# Patient Record
Sex: Female | Born: 1940
Health system: Southern US, Community
[De-identification: ages and names within clinical notes are randomized; demographics above are authoritative.]

## PROBLEM LIST (undated history)

## (undated) DIAGNOSIS — M5136 Other intervertebral disc degeneration, lumbar region: Secondary | ICD-10-CM

## (undated) DIAGNOSIS — M1990 Unspecified osteoarthritis, unspecified site: Secondary | ICD-10-CM

## (undated) DIAGNOSIS — F32A Depression, unspecified: Secondary | ICD-10-CM

## (undated) DIAGNOSIS — I6523 Occlusion and stenosis of bilateral carotid arteries: Secondary | ICD-10-CM

## (undated) DIAGNOSIS — R55 Syncope and collapse: Secondary | ICD-10-CM

## (undated) DIAGNOSIS — G629 Polyneuropathy, unspecified: Secondary | ICD-10-CM

## (undated) DIAGNOSIS — C801 Malignant (primary) neoplasm, unspecified: Secondary | ICD-10-CM

## (undated) DIAGNOSIS — D649 Anemia, unspecified: Secondary | ICD-10-CM

## (undated) DIAGNOSIS — E785 Hyperlipidemia, unspecified: Secondary | ICD-10-CM

## (undated) DIAGNOSIS — Z9889 Other specified postprocedural states: Secondary | ICD-10-CM

## (undated) DIAGNOSIS — R112 Nausea with vomiting, unspecified: Secondary | ICD-10-CM

## (undated) DIAGNOSIS — D497 Neoplasm of unspecified behavior of endocrine glands and other parts of nervous system: Secondary | ICD-10-CM

## (undated) DIAGNOSIS — K449 Diaphragmatic hernia without obstruction or gangrene: Secondary | ICD-10-CM

## (undated) DIAGNOSIS — I4510 Unspecified right bundle-branch block: Secondary | ICD-10-CM

## (undated) DIAGNOSIS — G43909 Migraine, unspecified, not intractable, without status migrainosus: Secondary | ICD-10-CM

## (undated) DIAGNOSIS — M51369 Other intervertebral disc degeneration, lumbar region without mention of lumbar back pain or lower extremity pain: Secondary | ICD-10-CM

## (undated) DIAGNOSIS — F329 Major depressive disorder, single episode, unspecified: Secondary | ICD-10-CM

## (undated) DIAGNOSIS — G4700 Insomnia, unspecified: Secondary | ICD-10-CM

## (undated) DIAGNOSIS — R519 Headache, unspecified: Secondary | ICD-10-CM

## (undated) DIAGNOSIS — IMO0001 Reserved for inherently not codable concepts without codable children: Secondary | ICD-10-CM

## (undated) DIAGNOSIS — K219 Gastro-esophageal reflux disease without esophagitis: Secondary | ICD-10-CM

## (undated) DIAGNOSIS — Z973 Presence of spectacles and contact lenses: Secondary | ICD-10-CM

## (undated) DIAGNOSIS — F419 Anxiety disorder, unspecified: Secondary | ICD-10-CM

## (undated) DIAGNOSIS — N3941 Urge incontinence: Secondary | ICD-10-CM

## (undated) DIAGNOSIS — Z9109 Other allergy status, other than to drugs and biological substances: Secondary | ICD-10-CM

## (undated) HISTORY — PX: TONSILLECTOMY: SUR1361

## (undated) HISTORY — PX: ABDOMINAL HYSTERECTOMY: SHX81

## (undated) HISTORY — PX: DILATION AND CURETTAGE OF UTERUS: SHX78

## (undated) HISTORY — PX: CHOLECYSTECTOMY: SHX55

## (undated) HISTORY — PX: COLONOSCOPY W/ BIOPSIES AND POLYPECTOMY: SHX1376

---
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
## 2011-03-31 ENCOUNTER — Ambulatory Visit: Payer: Self-pay

## 2014-02-28 ENCOUNTER — Emergency Department: Payer: Self-pay | Admitting: Emergency Medicine

## 2014-02-28 LAB — BASIC METABOLIC PANEL
ANION GAP: 5 — AB (ref 7–16)
BUN: 13 mg/dL (ref 7–18)
CO2: 23 mmol/L (ref 21–32)
Calcium, Total: 8.3 mg/dL — ABNORMAL LOW (ref 8.5–10.1)
Chloride: 104 mmol/L (ref 98–107)
Creatinine: 1.01 mg/dL (ref 0.60–1.30)
GFR CALC NON AF AMER: 57 — AB
GLUCOSE: 90 mg/dL (ref 65–99)
Osmolality: 264 (ref 275–301)
Potassium: 4.2 mmol/L (ref 3.5–5.1)
Sodium: 132 mmol/L — ABNORMAL LOW (ref 136–145)

## 2014-02-28 LAB — CBC WITH DIFFERENTIAL/PLATELET
BASOS PCT: 0.6 %
Basophil #: 0 10*3/uL (ref 0.0–0.1)
EOS PCT: 5 %
Eosinophil #: 0.2 10*3/uL (ref 0.0–0.7)
HCT: 34.8 % — AB (ref 35.0–47.0)
HGB: 11.4 g/dL — AB (ref 12.0–16.0)
LYMPHS PCT: 32.5 %
Lymphocyte #: 1.5 10*3/uL (ref 1.0–3.6)
MCH: 31.2 pg (ref 26.0–34.0)
MCHC: 32.7 g/dL (ref 32.0–36.0)
MCV: 95 fL (ref 80–100)
Monocyte #: 0.4 x10 3/mm (ref 0.2–0.9)
Monocyte %: 9.7 %
Neutrophil #: 2.4 10*3/uL (ref 1.4–6.5)
Neutrophil %: 52.2 %
Platelet: 212 10*3/uL (ref 150–440)
RBC: 3.66 10*6/uL — ABNORMAL LOW (ref 3.80–5.20)
RDW: 12.9 % (ref 11.5–14.5)
WBC: 4.6 10*3/uL (ref 3.6–11.0)

## 2014-02-28 LAB — URINALYSIS, COMPLETE
BACTERIA: NONE SEEN
Bilirubin,UR: NEGATIVE
Blood: NEGATIVE
Glucose,UR: NEGATIVE mg/dL (ref 0–75)
Ketone: NEGATIVE
NITRITE: NEGATIVE
Ph: 5 (ref 4.5–8.0)
Protein: NEGATIVE
RBC,UR: 1 /HPF (ref 0–5)
SPECIFIC GRAVITY: 1.012 (ref 1.003–1.030)
Squamous Epithelial: <1
WBC UR: 6 /HPF (ref 0–5)

## 2014-02-28 LAB — TROPONIN I: Troponin-I: <0.02 ng/mL

## 2014-12-07 ENCOUNTER — Other Ambulatory Visit: Payer: Self-pay | Admitting: Neurology

## 2014-12-07 DIAGNOSIS — R404 Transient alteration of awareness: Secondary | ICD-10-CM

## 2014-12-14 ENCOUNTER — Ambulatory Visit
Admission: RE | Admit: 2014-12-14 | Discharge: 2014-12-14 | Disposition: A | Discharge status: Home / Self Care | Payer: Medicare Other | Source: Ambulatory Visit | Attending: Neurology | Admitting: Neurology

## 2014-12-14 DIAGNOSIS — R404 Transient alteration of awareness: Secondary | ICD-10-CM | POA: Insufficient documentation

## 2015-03-15 ENCOUNTER — Ambulatory Visit: Payer: Medicare Other | Attending: Neurology

## 2015-03-15 DIAGNOSIS — R531 Weakness: Secondary | ICD-10-CM | POA: Diagnosis present

## 2015-03-15 DIAGNOSIS — R2681 Unsteadiness on feet: Secondary | ICD-10-CM

## 2015-03-16 NOTE — Therapy (Addendum)
[place] MAIN [facility] SERVICES [address] [place], [place], [address] Phone: [phone]   Fax:  [phone]  Physical Therapy Evaluation  Patient Details  Name: Heidi Cross MRN: [id number] Date of Birth: [birth year] Referring Provider: Ozella Rocks  Encounter Date: 03/15/2015      PT End of Session - 03/16/15 1151    Visit Number 1   Number of Visits 9   Date for PT Re-Evaluation 04/12/15   Authorization Type 1/10 g codes   PT Start Time 1093   PT Stop Time 1445   PT Time Calculation (min) 60 min   Equipment Utilized During Treatment Gait belt   Activity Tolerance Patient tolerated treatment well   Behavior During Therapy [facility] for tasks assessed/performed      History reviewed. No pertinent past medical history.  History reviewed. No pertinent past surgical history.  There were no vitals filed for this visit.  Visit Diagnosis:  Unsteadiness on feet - Plan: PT plan of care cert/re-cert  Weakness - Plan: PT plan of care cert/re-cert      Subjective Assessment - 03/16/15 1113    Subjective Pt reports she is concerned about her balance and suffered a serious fall in the last six months (August), where she fell in the bathroom and broke her nose and lost consciousness.  Pt believes part of the problem arises from her lawnmower accident in 2002 were she sustained permanent never damage that has progressively worsen and now has constant R LE pain, developed neuropathy, weakness in her R LE.  Pt is constantly looking down, stumbles, has difficultly clearing her right foot, and has difficulty with stairs and ambulating over unleveled surfaces and on carpet.  Pt has to use a cane, cart or hold on to her husband for support when ambulating outside her home. Pt recently had a nerve conduction test which showed bilateral neuropathy and decreased conduction to R LE musculature.  Pt currently has 3-4/10 pain in the lateral aspect of her lower  right leg.     Patient Stated Goals Pt wants to regain strength in her R LE and improve ability to do yard work.     Currently in Pain? Yes   Pain Score 4    Pain Orientation Right;Lower;Lateral   Pain Descriptors / Indicators Aching            [facility] PT Assessment - 03/16/15 1146    Assessment   Medical Diagnosis Imbalance   Referring Provider Brigitte Pulse, [place]   Onset Date/Surgical Date 02/14/15   Hand Dominance Right   Next MD Visit 08/20/2015   Prior Therapy none   Precautions   Precautions Fall   Restrictions   Weight Bearing Restrictions No   Balance Screen   Has the patient fallen in the past 6 months Yes   How many times? 1   Has the patient had a decrease in activity level because of a fear of falling?  Yes   Is the patient reluctant to leave their home because of a fear of falling?  No   Home Social worker Private residence   Living Arrangements Spouse/significant other   Available Help at Discharge Family;Friend(s)   Type of [place] One level   [employer] - single point;Walker - 2 wheels;Toilet riser   Prior Function   Level of Independence Independent   Vocation Retired   Charity fundraiser Status Within Functional Limits for  tasks assessed   Sensation   Light Touch Appears Intact   Proprioception Appears Intact   Coordination   Gross Motor Movements are Fluid and Coordinated Yes       PAIN: 3-4/10 on the lateral aspect of her R LE  POSTURE: Slump sitting with rounded shoulders Pt stands with knees slightly flexed and with slight forward trunk lean   AROM: LE WFL, right knee lacking ~2-3 degrees of full knee extension   STRENGTH:  Graded on a 0-5 scale Muscle Group Left Right  Elbow    Wrist/hand    Hip Flex 4- 4-  Hip Abd 3+ 3  Hip Add    Hip Ext 3+ 3+  Hip IR/ER    Knee Flex 4+ 4+  Knee Ext 5 5  Ankle DF 4+ 4  Ankle PF 2 2   SENSATION: Appears intact for LEs  LE Myotomes: WNL  bilaterally LE Dermatomes: decreased from L4-S2 on R LE  Reflex: Knee jerk: +2 bilaterally  Ankle jerk: absent bilaterally    FUNCTIONAL MOBILITY: Pt is independent with bed mobility and transfers   BALANCE: Pt is unsteady with static and dynamic activities and requires close supervision-CGA throughout session   GAIT: Pt ambulates with wide base of support,flexed knees bilaterally, minimal hip extension and plantarflexion, uses cane on R UE OUTCOME MEASURES: TEST Outcome Interpretation  5 times sit<>stand 17.38sec >34 yo, >15 sec indicates increased risk for falls  10 meter walk test    0.88 m/s with cane 0.80 m/s without cane   <1.0 m/s indicates increased risk for falls; limited community ambulator  Timed up and Go         10 sec with cane 12 sec without cane <14 sec indicates increased risk for falls  6 minute walk test                Feet 1000 feet is community Water quality scientist 40/56 <36/56 (100% risk for falls), 37-45 (80% risk for falls); 46-51 (>50% risk for falls); 52-55 (lower risk <25% of falls)  ABC scale 36.25%/100 %      there ex; Sit to stand x10, pt required close supervision for safety Hip abduction 2x10 each LE, pt required verbal and tactile cues to maintain hips in neutral and toes pointing forward                     PT Education - 03/16/15 1150    Education provided Yes   Education Details plan of care, HEP, outcome measures    Person(s) Educated Patient   Methods Explanation   Comprehension Verbalized understanding             PT Long Term Goals - 03/16/15 1158    PT LONG TERM GOAL #1   Title pt's Berg balance score will improve by at least 8 points indicating decreased fall risk    Baseline on eval (10/25) 40/56   Time 4   Period Weeks   Status New   PT LONG TERM GOAL #2   Title pt's 5x sit to stand will be less than 11 sec for average time for her age indicating improved functional LE    Baseline on  eval (10/25) 17.38 sec   Time 4   Period Weeks   Status New   PT LONG TERM GOAL #3   Title pt's ABC scale will improve by 20 points indicating imroved confidence with functional activites such as performing yard work  Baseline on eval (10/25) 36.25%    Time 4   Period Weeks               Plan - 2015/04/12 1152    Clinical Impression Statement pt is a pleasant 74 year old female with chief complaint unsteadiness and weakness.  Pt sustained injury that caused nerve damage to her R LE and now has constant pain.  Based on her evaluation today, she is at risk for falls and has decreased LE functional strength.  Pt displays decreased balance, especially dynamic balance.  Pt would benefit from skilled PT services to improve her impairments, decrease her risk of falls and improve functional activity tolerance to perform functional activities.     Pt will benefit from skilled therapeutic intervention in order to improve on the following deficits Abnormal gait;Difficulty walking;Decreased strength;Decreased balance;Decreased activity tolerance;Decreased endurance;Pain   Rehab Potential Fair   Clinical Impairments Affecting Rehab Potential comorbidities    PT Frequency 2x / week   PT Duration 4 weeks   PT Treatment/Interventions Electrical Stimulation;Cryotherapy;Moist Heat;Therapeutic exercise;Therapeutic activities;Stair training;Functional mobility training;Gait training;Balance training;Neuromuscular re-education;Manual techniques;Vestibular   PT Next Visit Plan progress HEP          G-Codes - 04/12/2015 1204    Functional Assessment Tool Used history, clinical judgement, outcome measures    Functional Limitation Mobility: Walking and moving around   Mobility: Walking and Moving Around Current Status (R1540) At least 40 percent but less than 60 percent impaired, limited or restricted   Mobility: Walking and Moving Around Goal Status [phone]) At least 20 percent but less than 40 percent  impaired, limited or restricted       Problem List There are no active problems to display for this patient.  Renford Dills, SPT This entire session was performed under direct supervision and direction of a licensed Chiropractor . I have personally read, edited and approve of the note as written. Gorden Harms. Tortorici, PT, DPT [phone]  Tortorici,Ashley Apr 12, 2015, 1:23 PM  [place] MAIN [facility] SERVICES [address] [place], [place], [address] Phone: [phone]   Fax:  [phone]  Name: SHARYN BRILLIANT MRN: [id number] Date of Birth: [birth year]

## 2015-03-16 NOTE — Patient Instructions (Signed)
[email or web address] Sit to stand 2x10 Standing hip abduction 2x10 each LE

## 2015-03-21 ENCOUNTER — Ambulatory Visit: Payer: Medicare Other | Admitting: Physical Therapy

## 2015-03-22 ENCOUNTER — Ambulatory Visit: Payer: Medicare Other

## 2015-03-24 ENCOUNTER — Ambulatory Visit: Payer: Medicare Other

## 2015-03-29 ENCOUNTER — Ambulatory Visit: Payer: Medicare Other | Attending: Neurology

## 2015-03-29 DIAGNOSIS — R531 Weakness: Secondary | ICD-10-CM | POA: Diagnosis present

## 2015-03-29 DIAGNOSIS — R2681 Unsteadiness on feet: Secondary | ICD-10-CM | POA: Diagnosis present

## 2015-03-29 NOTE — Therapy (Signed)
[place] MAIN [facility] SERVICES [address] [place], [place], [address] Phone: [phone]   Fax:  [phone]  Physical Therapy Treatment  Patient Details  Name: Heidi Cross MRN: [id number] Date of Birth: Apr 17, 1941 Referring Provider: Ozella Rocks  Encounter Date: 03/29/2015      PT End of Session - 03/29/15 1257    Visit Number 2   Number of Visits 9   Date for PT Re-Evaluation 04/12/15   Authorization Type 2/10 g codes   PT Start Time 0930   PT Stop Time 1015   PT Time Calculation (min) 45 min   Equipment Utilized During Treatment Gait belt   Activity Tolerance Patient tolerated treatment well   Behavior During Therapy [facility] for tasks assessed/performed      History reviewed. No pertinent past medical history.  History reviewed. No pertinent past surgical history.  There were no vitals filed for this visit.  Visit Diagnosis:  Unsteadiness on feet  Weakness      Subjective Assessment - 03/29/15 0936    Subjective pt relates she had to cancel her last appointments due to having a UTI.  Pt relates she feels better today but still feels weak and her balance is off.  Pt currently has 4/10 in her low back.     Patient Stated Goals Pt wants to regain strength in her R LE and improve ability to do yard work.     Currently in Pain? Yes   Pain Score 4    Pain Location Back   Pain Orientation Lower      There ex: Nustep level 3 x 2 min: no charge Standing hip flexion/abduction/extension 2x10 with cues to maintain upright posture versus leaning Heel raises 2x10 in //bars Sit to stand x10, with cues to bring heels further back and bring "nose over "toes to improve performance and decrease UE assist.   Neuor re-ed Static balance with NBOS with eyes closed and vertical/horizontal head turns x2 min Semi tandem stance 2x30sec each side with cuing for positioning and decrease use of UE for assist Standing on AIREX with vertical and  horizontal head turns and UE movements, eyes closed 2x2 min Pt required close supervision for safety  Pt required sitting and standing breaks throughout session                            PT Education - 03/29/15 1256    Education provided Yes   Education Details educated patient plan  of care how strengtheing and balance exercises decreases fall risk   Person(s) Educated Patient   Methods Explanation   Comprehension Verbalized understanding             PT Long Term Goals - 03/16/15 1158    PT LONG TERM GOAL #1   Title pt's Berg balance score will improve by at least 8 points indicating decreased fall risk    Baseline on eval (10/25) 40/56   Time 4   Period Weeks   Status New   PT LONG TERM GOAL #2   Title pt's 5x sit to stand will be less than 11 sec for average time for her age indicating improved functional LE    Baseline on eval (10/25) 17.38 sec   Time 4   Period Weeks   Status New   PT LONG TERM GOAL #3   Title pt's ABC scale will improve by 20 points indicating imroved confidence with  functional activites such as performing yard work    Baseline on eval (10/25) 36.25%    Time 4   Period Weeks               Plan - 03/29/15 1302    Clinical Impression Statement Pt did well today and did not experience an increase in pain with exercises today.  pt required rest breaks throughout session due to feeling fatigue and weak from her illness.  Pt demonstrates decreased weak hip extensors and abductors by compensating with other muscles and requires cueing for correct technique and patient reports increased difficulty performing exercises correctly.     Pt will benefit from skilled therapeutic intervention in order to improve on the following deficits Abnormal gait;Difficulty walking;Decreased strength;Decreased balance;Decreased activity tolerance;Decreased endurance;Pain   Rehab Potential Fair   Clinical Impairments Affecting Rehab Potential  comorbidities    PT Frequency 2x / week   PT Duration 4 weeks   PT Treatment/Interventions Electrical Stimulation;Cryotherapy;Moist Heat;Therapeutic exercise;Therapeutic activities;Stair training;Functional mobility training;Gait training;Balance training;Neuromuscular re-education;Manual techniques;Vestibular   PT Next Visit Plan progress HEP        Problem List There are no active problems to display for this patient.  Renford Dills, SPT This entire session was performed under direct supervision and direction of a licensed Chiropractor . I have personally read, edited and approve of the note as written. Gorden Harms. Tortorici, PT, DPT [phone]  Tortorici,Ashley 03/29/2015, 6:27 PM  [place] MAIN [facility] SERVICES [address] [place], [place], [address] Phone: [phone]   Fax:  [phone]  Name: Heidi Cross MRN: [id number] Date of Birth: Oct 12, 1940

## 2015-03-31 ENCOUNTER — Ambulatory Visit: Payer: Medicare Other

## 2015-03-31 DIAGNOSIS — R2681 Unsteadiness on feet: Secondary | ICD-10-CM | POA: Diagnosis not present

## 2015-03-31 DIAGNOSIS — R531 Weakness: Secondary | ICD-10-CM

## 2015-03-31 NOTE — Therapy (Signed)
[place] MAIN [facility] SERVICES [address] [place], [place], [address] Phone: [phone]   Fax:  [phone]  Physical Therapy Treatment  Patient Details  Name: Heidi Cross MRN: [id number] Date of Birth: Dec 09, 1940 Referring Provider: Ozella Rocks  Encounter Date: 03/31/2015      PT End of Session - 03/31/15 1951    Visit Number 3   Number of Visits 9   Date for PT Re-Evaluation 04/12/15   Authorization Type 3/10 g codes   PT Start Time [id number]   PT Stop Time 1430   PT Time Calculation (min) 45 min   Equipment Utilized During Treatment Gait belt   Activity Tolerance Patient tolerated treatment well   Behavior During Therapy [facility] for tasks assessed/performed      No past medical history on file.  No past surgical history on file.  There were no vitals filed for this visit.  Visit Diagnosis:  Unsteadiness on feet  Weakness      Subjective Assessment - 03/31/15 1950    Subjective Pt feels like she is stronger and has been walking short distances without an AD.  Pt had increased "neuropathy" after her last session which resolved by the following day.    Patient Stated Goals Pt wants to regain strength in her R LE and improve ability to do yard work.     Currently in Pain? No/denies   Pain Score 0-No pain       There ex: Nustep level 3 x 2 min: no charge Standing hip flexion/abduction/extension 2x10 with cues to maintain upright posture versus leaning laterally  Heel raises 2x10 in //bars SLR 2x10, pt required cues to maintain quad set, especially on right Clamshells 2x10, pt required tactile cues to prevent hips from rotating  Sit to stand in 16.40 seconds  Lying prone/on elbows, pt experienced decreased radicular pain from 4-2/10                          PT Education - 03/31/15 1951    Education provided Yes   Education Details plan of care, HEP and lyiing prone on elbows   Person(s) Educated  Patient   Methods Explanation   Comprehension Verbalized understanding             PT Long Term Goals - 03/16/15 1158    PT LONG TERM GOAL #1   Title pt's Berg balance score will improve by at least 8 points indicating decreased fall risk    Baseline on eval (10/25) 40/56   Time 4   Period Weeks   Status New   PT LONG TERM GOAL #2   Title pt's 5x sit to stand will be less than 11 sec for average time for her age indicating improved functional LE    Baseline on eval (10/25) 17.38 sec   Time 4   Period Weeks   Status New   PT LONG TERM GOAL #3   Title pt's ABC scale will improve by 20 points indicating imroved confidence with functional activites such as performing yard work    Baseline on eval (10/25) 36.25%    Time 4   Period Weeks               Plan - 03/31/15 1951    Clinical Impression Statement Pt focused on LE strengthening standing and in supine/sidelying. Pt did really well today with her exercise and was able to ambulate around the gym  without her cane.  Pt experienced increased back pain with bridges which resolved with lying prone and lying prone on elbows, suggesting pt may have lumbar derangement with extension preference.     Pt will benefit from skilled therapeutic intervention in order to improve on the following deficits Abnormal gait;Difficulty walking;Decreased strength;Decreased balance;Decreased activity tolerance;Decreased endurance;Pain   Rehab Potential Fair   Clinical Impairments Affecting Rehab Potential comorbidities    PT Frequency 2x / week   PT Duration 4 weeks   PT Treatment/Interventions Electrical Stimulation;Cryotherapy;Moist Heat;Therapeutic exercise;Therapeutic activities;Stair training;Functional mobility training;Gait training;Balance training;Neuromuscular re-education;Manual techniques;Vestibular   PT Next Visit Plan progress HEP        Problem List There are no active problems to display for this patient.  Heidi Cross,  SPT This entire session was performed under direct supervision and direction of a licensed Chiropractor . I have personally read, edited and approve of the note as written. Gorden Harms. Tortorici, PT, DPT [phone]  Tortorici,Ashley 03/31/2015, 10:42 PM  [place] MAIN [facility] SERVICES [address] [place], [place], [address] Phone: [phone]   Fax:  [phone]  Name: Heidi Cross MRN: [id number] Date of Birth: 1940-11-21

## 2015-03-31 NOTE — Patient Instructions (Signed)
[email or web address]  SLR 2x10 Clamshells 2x10

## 2015-04-05 ENCOUNTER — Ambulatory Visit: Payer: Medicare Other

## 2015-04-05 DIAGNOSIS — R2681 Unsteadiness on feet: Secondary | ICD-10-CM

## 2015-04-05 DIAGNOSIS — R531 Weakness: Secondary | ICD-10-CM

## 2015-04-05 NOTE — Therapy (Signed)
[place] MAIN [facility] SERVICES [address] [place], [place], [address] Phone: [phone]   Fax:  [phone]  Physical Therapy Treatment  Patient Details  Name: Heidi Cross MRN: [id number] Date of Birth: 1941/03/24 Referring Provider: Ozella Rocks  Encounter Date: 04/05/2015      PT End of Session - 04/05/15 1656    Visit Number 4   Number of Visits 9   Date for PT Re-Evaluation 04/12/15   Authorization Type 4/10 g codes   PT Start Time 1400   PT Stop Time 1430   PT Time Calculation (min) 30 min   Equipment Utilized During Treatment Gait belt   Activity Tolerance Patient tolerated treatment well   Behavior During Therapy [facility] for tasks assessed/performed      History reviewed. No pertinent past medical history.  History reviewed. No pertinent past surgical history.  There were no vitals filed for this visit.  Visit Diagnosis:  Unsteadiness on feet  Weakness      Subjective Assessment - 04/05/15 1653    Subjective Pt relates she feels her like her legs are getting stronger and did not have to use her cane at church this weekend to ascend steps.  Pt has had increased back pain since Saturday and thinks it is a combination of SLS, clamshells and extended standing and walking.  pt currently has 4/10 pain in her mid-back. pt was late today due family emergency.     Patient Stated Goals Pt wants to regain strength in her R LE and improve ability to do yard work.     Currently in Pain? Yes   Pain Score 4    Pain Location Back      There ex: Nustep level 2 x2 min: no charge [employer] press with green band 2x10 each side with final set with perturbations Calf stretch 3x30 seconds each LE Sit to stand x2 Pt required cueing for correct positioning and pace    Neuro re-ed: Tandem stance in//bars x30 sec each LE, pt experienced tightness in bilateral calfs Toe taps on AIREX pad 2x10 each LE, pt required cueing for correct  technique  Pt required break after to taps Vertical and horizontal head turns on narrow base of support with eyes closed 2x30 sec Pt required supervision throughout session for safety                            PT Education - 04/05/15 1655    Education provided Yes   Education Details plan of care and new hip/core strength exercise and balance exercise    Person(s) Educated Patient   Methods Explanation   Comprehension Verbalized understanding             PT Long Term Goals - 03/16/15 1158    PT LONG TERM GOAL #1   Title pt's Berg balance score will improve by at least 8 points indicating decreased fall risk    Baseline on eval (10/25) 40/56   Time 4   Period Weeks   Status New   PT LONG TERM GOAL #2   Title pt's 5x sit to stand will be less than 11 sec for average time for her age indicating improved functional LE    Baseline on eval (10/25) 17.38 sec   Time 4   Period Weeks   Status New   PT LONG TERM GOAL #3   Title pt's ABC scale will improve by 20  points indicating imroved confidence with functional activites such as performing yard work    Baseline on eval (10/25) 36.25%    Time 4   Period Weeks               Plan - 04/05/15 1659    Clinical Impression Statement Session consisted of neuro re-ed and strength exercises that did not increase pt's low back pain.  pt did well today and was able to ambulate around the gym with improved balance and perform sit to stand without UE assist and with fluid motion.  pt notes she might not be able to attend next session due to family matters.     Pt will benefit from skilled therapeutic intervention in order to improve on the following deficits Abnormal gait;Difficulty walking;Decreased strength;Decreased balance;Decreased activity tolerance;Decreased endurance;Pain   Rehab Potential Fair   Clinical Impairments Affecting Rehab Potential comorbidities    PT Frequency 2x / week   PT Duration 4 weeks    PT Treatment/Interventions Electrical Stimulation;Cryotherapy;Moist Heat;Therapeutic exercise;Therapeutic activities;Stair training;Functional mobility training;Gait training;Balance training;Neuromuscular re-education;Manual techniques;Vestibular   PT Next Visit Plan progress HEP        Problem List There are no active problems to display for this patient.  Renford Dills, SPT This entire session was performed under direct supervision and direction of a licensed Chiropractor . I have personally read, edited and approve of the note as written. Gorden Harms. Tortorici, PT, DPT [phone]  Tortorici,Ashley 04/06/2015, 9:11 AM  [place] MAIN [facility] SERVICES [address] [place], [place], [address] Phone: [phone]   Fax:  [phone]  Name: Heidi Cross MRN: [id number] Date of Birth: 1941/04/12

## 2015-04-07 ENCOUNTER — Ambulatory Visit: Payer: Medicare Other

## 2015-04-12 ENCOUNTER — Ambulatory Visit: Payer: Medicare Other

## 2015-04-13 ENCOUNTER — Inpatient Hospital Stay
Admission: RE | Admit: 2015-04-13 | Discharge: 2015-04-13 | Disposition: A | Discharge status: Home / Self Care | Payer: Self-pay | Source: Ambulatory Visit | Attending: *Deleted | Admitting: *Deleted

## 2015-04-13 ENCOUNTER — Other Ambulatory Visit: Payer: Self-pay | Admitting: *Deleted

## 2015-04-13 DIAGNOSIS — Z9289 Personal history of other medical treatment: Secondary | ICD-10-CM

## 2015-04-19 ENCOUNTER — Ambulatory Visit: Payer: Medicare Other

## 2015-04-19 DIAGNOSIS — R2681 Unsteadiness on feet: Secondary | ICD-10-CM

## 2015-04-19 DIAGNOSIS — R531 Weakness: Secondary | ICD-10-CM

## 2015-04-19 NOTE — Therapy (Signed)
[place] MAIN [facility] SERVICES [address] [place], [place], [address] Phone: [phone]   Fax:  [phone]  Physical Therapy Treatment  Patient Details  Name: Heidi Cross MRN: [id number] Date of Birth: 25-Dec-1940 Referring Provider: Ozella Rocks  Encounter Date: 04/19/2015      PT End of Session - 04/19/15 1429    Visit Number 5   Number of Visits 9   Date for PT Re-Evaluation 04/12/15   Authorization Type 5/10   PT Start Time [id number]   PT Stop Time 1425   PT Time Calculation (min) 40 min   Equipment Utilized During Treatment Gait belt   Activity Tolerance Patient tolerated treatment well   Behavior During Therapy [facility] for tasks assessed/performed      History reviewed. No pertinent past medical history.  History reviewed. No pertinent past surgical history.  There were no vitals filed for this visit.  Visit Diagnosis:  Unsteadiness on feet  Weakness      Subjective Assessment - 04/19/15 1428    Subjective pt reports her back has just been over worked and flared up. she has been very busy out of town and with PT. pt does report she feels her balance and leg press has improved.    Patient Stated Goals Pt wants to regain strength in her R LE and improve ability to do yard work.     Currently in Pain? Yes   Pain Score 5    Pain Location Back    Treatment: therex: PT reassessed outcome measures and progress towards goals as follows:       [facility] PT Assessment - 04/19/15 0001    Standardized Balance Assessment   Standardized Balance Assessment Berg Balance Test;Dynamic Gait Index;Timed Up and Go Test;Five Times Sit to Stand;10 meter walk test   Five times sit to stand comments  16   10 Meter Walk 1.54m/s   Berg Balance Test   Sit to Stand Able to stand without using hands and stabilize independently   Standing Unsupported Able to stand safely 2 minutes   Sitting with Back Unsupported but Feet Supported on Floor or Stool  Able to sit safely and securely 2 minutes   Stand to Sit Sits safely with minimal use of hands   Transfers Able to transfer safely, minor use of hands   Standing Unsupported with Eyes Closed Able to stand 10 seconds safely   Standing Ubsupported with Feet Together Able to place feet together independently and stand 1 minute safely   From Standing, Reach Forward with Outstretched Arm Can reach confidently >25 cm (10")   From Standing Position, Pick up Object from Floor Able to pick up shoe safely and easily   From Standing Position, Turn to Look Behind Over each Shoulder Looks behind from both sides and weight shifts well   Turn 360 Degrees Able to turn 360 degrees safely in 4 seconds or less   Standing Unsupported, Alternately Place Feet on Step/Stool Able to stand independently and safely and complete 8 steps in 20 seconds   Standing Unsupported, One Foot in Front Able to plae foot ahead of the other independently and hold 30 seconds   Standing on One Leg Able to lift leg independently and hold equal to or more than 3 seconds   Total Score 53   Dynamic Gait Index   Level Surface Mild Impairment   Change in Gait Speed Mild Impairment   Gait with Horizontal Head Turns Mild Impairment  Gait with Vertical Head Turns Moderate Impairment   Gait and Pivot Turn Normal   Step Over Obstacle Moderate Impairment   Step Around Obstacles Normal   Steps Mild Impairment   Total Score 16   Timed Up and Go Test   Normal TUG (seconds) 10                             PT Education - May 08, 2015 1429    Education provided Yes   Education Details plan of care, continue HEP, return to PT when ready   Person(s) Educated Patient   Methods Explanation   Comprehension Verbalized understanding             PT Long Term Goals - 2015/05/08 1431    PT LONG TERM GOAL #1   Title pt's Berg balance score will improve by at least 8 points indicating decreased fall risk    Baseline on eval  (10/25) 40/56   Time 4   Period Weeks   Status Achieved   PT LONG TERM GOAL #2   Title pt's 5x sit to stand will be less than 11 sec for average time for her age indicating improved functional LE    Baseline on eval (10/25) 17.38 sec   Time 4   Period Weeks   Status Achieved   PT LONG TERM GOAL #3   Title pt's ABC scale will improve by 20 points indicating imroved confidence with functional activites such as performing yard work    Baseline on eval (10/25) 36.25%    Time 4   Period Weeks   Status Achieved               Plan - 05-08-2015 1430    Clinical Impression Statement pt has achieved initial PT goals at this time and has improved her gait speed, LE functional strength and static balance. she significantly improved Berg balance test score from 40 to 53/56. pt was tested with DGI today and scored 16/24 and would likely benefit from continued skilled PT services. pt is having incerased lower back pain and other extrenuating circumstances where she would like to hold off of PT for a few months. pt will be DC to HEP at this time and advised to return to PT when her schedule allows.    Pt will benefit from skilled therapeutic intervention in order to improve on the following deficits Abnormal gait;Difficulty walking;Decreased strength;Decreased balance;Decreased activity tolerance;Decreased endurance;Pain   Rehab Potential Fair   Clinical Impairments Affecting Rehab Potential comorbidities    PT Frequency 2x / week   PT Duration 4 weeks   PT Treatment/Interventions Electrical Stimulation;Cryotherapy;Moist Heat;Therapeutic exercise;Therapeutic activities;Stair training;Functional mobility training;Gait training;Balance training;Neuromuscular re-education;Manual techniques;Vestibular   PT Next Visit Plan progress HEP          G-Codes - 2015/05/08 1432    Functional Assessment Tool Used history, clinical judgement, outcome measures    Functional Limitation Mobility: Walking and  moving around   Mobility: Walking and Moving Around Current Status [id number]) At least 20 percent but less than 40 percent impaired, limited or restricted   Mobility: Walking and Moving Around Goal Status [id number]) At least 20 percent but less than 40 percent impaired, limited or restricted      Problem List There are no active problems to display for this patient.  Gorden Harms. Deklyn Gibbon, PT, DPT [phone]  Shalin Vonbargen 05-08-15, 2:32 PM  [place] MAIN [facility] SERVICES  [place], [place], [address] Phone: [phone]   Fax:  [phone]  Name: Heidi Cross MRN: [id number] Date of Birth: 09-08-1940

## 2015-04-21 ENCOUNTER — Ambulatory Visit: Payer: Medicare Other

## 2015-05-25 ENCOUNTER — Other Ambulatory Visit: Payer: Self-pay | Admitting: Family Medicine

## 2015-05-25 DIAGNOSIS — N63 Unspecified lump in unspecified breast: Secondary | ICD-10-CM

## 2015-05-25 DIAGNOSIS — Z1231 Encounter for screening mammogram for malignant neoplasm of breast: Secondary | ICD-10-CM

## 2015-06-10 ENCOUNTER — Ambulatory Visit
Admission: RE | Admit: 2015-06-10 | Discharge: 2015-06-10 | Disposition: A | Discharge status: Home / Self Care | Payer: Medicare Other | Source: Ambulatory Visit | Attending: Family Medicine | Admitting: Family Medicine

## 2015-06-10 ENCOUNTER — Other Ambulatory Visit: Payer: Self-pay | Admitting: Family Medicine

## 2015-06-10 DIAGNOSIS — Z1231 Encounter for screening mammogram for malignant neoplasm of breast: Secondary | ICD-10-CM

## 2015-06-10 DIAGNOSIS — N63 Unspecified lump in unspecified breast: Secondary | ICD-10-CM

## 2015-06-14 ENCOUNTER — Other Ambulatory Visit: Payer: Self-pay | Admitting: Internal Medicine

## 2015-06-14 DIAGNOSIS — G9001 Carotid sinus syncope: Secondary | ICD-10-CM

## 2015-06-21 ENCOUNTER — Ambulatory Visit
Admission: RE | Admit: 2015-06-21 | Discharge: 2015-06-21 | Disposition: A | Discharge status: Home / Self Care | Payer: Medicare Other | Source: Ambulatory Visit | Attending: Internal Medicine | Admitting: Internal Medicine

## 2015-06-21 DIAGNOSIS — G9001 Carotid sinus syncope: Secondary | ICD-10-CM

## 2015-06-21 DIAGNOSIS — I6523 Occlusion and stenosis of bilateral carotid arteries: Secondary | ICD-10-CM | POA: Insufficient documentation

## 2015-07-18 ENCOUNTER — Other Ambulatory Visit: Payer: Self-pay | Admitting: Physical Medicine and Rehabilitation

## 2015-07-18 DIAGNOSIS — M5416 Radiculopathy, lumbar region: Secondary | ICD-10-CM

## 2015-08-05 ENCOUNTER — Ambulatory Visit
Admission: RE | Admit: 2015-08-05 | Discharge: 2015-08-05 | Disposition: A | Discharge status: Home / Self Care | Payer: Medicare Other | Source: Ambulatory Visit | Attending: Physical Medicine and Rehabilitation | Admitting: Physical Medicine and Rehabilitation

## 2015-08-05 DIAGNOSIS — M4690 Unspecified inflammatory spondylopathy, site unspecified: Secondary | ICD-10-CM | POA: Insufficient documentation

## 2015-08-05 DIAGNOSIS — M5416 Radiculopathy, lumbar region: Secondary | ICD-10-CM | POA: Diagnosis present

## 2015-08-05 DIAGNOSIS — M2578 Osteophyte, vertebrae: Secondary | ICD-10-CM | POA: Insufficient documentation

## 2015-08-05 DIAGNOSIS — M488X6 Other specified spondylopathies, lumbar region: Secondary | ICD-10-CM | POA: Diagnosis not present

## 2015-08-05 DIAGNOSIS — M479 Spondylosis, unspecified: Secondary | ICD-10-CM | POA: Insufficient documentation

## 2015-08-12 ENCOUNTER — Other Ambulatory Visit: Payer: Self-pay | Admitting: Physical Medicine and Rehabilitation

## 2015-08-12 DIAGNOSIS — D329 Benign neoplasm of meninges, unspecified: Secondary | ICD-10-CM

## 2015-08-19 ENCOUNTER — Ambulatory Visit
Admission: RE | Admit: 2015-08-19 | Discharge: 2015-08-19 | Disposition: A | Discharge status: Home / Self Care | Payer: Medicare Other | Source: Ambulatory Visit | Attending: Physical Medicine and Rehabilitation | Admitting: Physical Medicine and Rehabilitation

## 2015-08-19 DIAGNOSIS — D329 Benign neoplasm of meninges, unspecified: Secondary | ICD-10-CM | POA: Diagnosis not present

## 2015-08-19 LAB — POCT I-STAT CREATININE: CREATININE: 0.9 mg/dL (ref 0.44–1.00)

## 2015-08-19 MED ORDER — GADOBENATE DIMEGLUMINE 529 MG/ML IV SOLN
20.0000 mL | Freq: Once | INTRAVENOUS | Status: AC | PRN
  Administered 2015-08-19: 17 mL via INTRAVENOUS

## 2015-09-22 ENCOUNTER — Other Ambulatory Visit: Payer: Self-pay | Admitting: Neurosurgery

## 2015-10-25 ENCOUNTER — Encounter (HOSPITAL_COMMUNITY): Payer: Self-pay

## 2015-10-25 ENCOUNTER — Encounter (HOSPITAL_COMMUNITY)
Admission: RE | Admit: 2015-10-25 | Discharge: 2015-10-25 | Disposition: A | Discharge status: Home / Self Care | Payer: Medicare Other | Source: Ambulatory Visit | Attending: Neurosurgery | Admitting: Neurosurgery

## 2015-10-25 DIAGNOSIS — Z01812 Encounter for preprocedural laboratory examination: Secondary | ICD-10-CM | POA: Insufficient documentation

## 2015-10-25 DIAGNOSIS — Z01818 Encounter for other preprocedural examination: Secondary | ICD-10-CM | POA: Insufficient documentation

## 2015-10-25 DIAGNOSIS — R55 Syncope and collapse: Secondary | ICD-10-CM | POA: Diagnosis not present

## 2015-10-25 DIAGNOSIS — Z79899 Other long term (current) drug therapy: Secondary | ICD-10-CM | POA: Insufficient documentation

## 2015-10-25 DIAGNOSIS — D497 Neoplasm of unspecified behavior of endocrine glands and other parts of nervous system: Secondary | ICD-10-CM | POA: Diagnosis not present

## 2015-10-25 DIAGNOSIS — K219 Gastro-esophageal reflux disease without esophagitis: Secondary | ICD-10-CM | POA: Insufficient documentation

## 2015-10-25 HISTORY — DX: Other intervertebral disc degeneration, lumbar region without mention of lumbar back pain or lower extremity pain: M51.369

## 2015-10-25 HISTORY — DX: Other intervertebral disc degeneration, lumbar region: M51.36

## 2015-10-25 HISTORY — DX: Presence of spectacles and contact lenses: Z97.3

## 2015-10-25 HISTORY — DX: Reserved for inherently not codable concepts without codable children: IMO0001

## 2015-10-25 HISTORY — DX: Insomnia, unspecified: G47.00

## 2015-10-25 HISTORY — DX: Syncope and collapse: R55

## 2015-10-25 HISTORY — DX: Other specified postprocedural states: R11.2

## 2015-10-25 HISTORY — DX: Malignant (primary) neoplasm, unspecified: C80.1

## 2015-10-25 HISTORY — DX: Anemia, unspecified: D64.9

## 2015-10-25 HISTORY — DX: Other allergy status, other than to drugs and biological substances: Z91.09

## 2015-10-25 HISTORY — DX: Other specified postprocedural states: Z98.890

## 2015-10-25 HISTORY — DX: Neoplasm of unspecified behavior of endocrine glands and other parts of nervous system: D49.7

## 2015-10-25 HISTORY — DX: Gastro-esophageal reflux disease without esophagitis: K21.9

## 2015-10-25 HISTORY — DX: Occlusion and stenosis of bilateral carotid arteries: I65.23

## 2015-10-25 HISTORY — DX: Polyneuropathy, unspecified: G62.9

## 2015-10-25 LAB — CBC
HCT: 35.7 % — ABNORMAL LOW (ref 36.0–46.0)
HEMOGLOBIN: 11.9 g/dL — AB (ref 12.0–15.0)
MCH: 30.4 pg (ref 26.0–34.0)
MCHC: 33.3 g/dL (ref 30.0–36.0)
MCV: 91.1 fL (ref 78.0–100.0)
Platelets: 230 10*3/uL (ref 150–400)
RBC: 3.92 MIL/uL (ref 3.87–5.11)
RDW: 12.7 % (ref 11.5–15.5)
WBC: 5 10*3/uL (ref 4.0–10.5)

## 2015-10-25 LAB — SURGICAL PCR SCREEN
MRSA, PCR: NEGATIVE
Staphylococcus aureus: NEGATIVE

## 2015-10-25 LAB — BASIC METABOLIC PANEL
ANION GAP: 7 (ref 5–15)
BUN: 10 mg/dL (ref 6–20)
CALCIUM: 9.7 mg/dL (ref 8.9–10.3)
CO2: 28 mmol/L (ref 22–32)
Chloride: 99 mmol/L — ABNORMAL LOW (ref 101–111)
Creatinine, Ser: 0.82 mg/dL (ref 0.44–1.00)
GFR calc non Af Amer: >60 mL/min (ref >60)
Glucose, Bld: 99 mg/dL (ref 65–99)
Potassium: 4.2 mmol/L (ref 3.5–5.1)
Sodium: 134 mmol/L — ABNORMAL LOW (ref 135–145)

## 2015-10-25 NOTE — Pre-Procedure Instructions (Signed)
TRENISHA RUNCK  10/25/2015      [id number] [place]ina Rabon, [place] - [address] [place] [address] Phone: [phone] Fax: [phone]    Your procedure is scheduled on Thursday, November 03, 2015  Report to [facility] Admitting at 7:30 A.M.( per MD)  Call this number if you have problems the morning of surgery:  [phone]   Remember:  Do not eat food or drink liquids after midnight Wednesday, November 02, 2015  Take these medicines the morning of surgery with A SIP OF WATER : DULoxetine (CYMBALTA), fexofenadine (ALLEGRA), if needed: HYDROcodone-acetaminophen (NORCO/VICODIN) for pain,  fluticasone (FLONASE) nasal spray for allergies Stop taking Aspirin, vitamins, fish oil and herbal medications such as Glucosamine-Chondroitin . Do not take any NSAIDs ie: Ibuprofen, Advil, Naproxen, BC and Goody Powder or any medication containing Aspirin; stop Thursday, October 27, 2015.  Do not wear jewelry, make-up or nail polish.  Do not wear lotions, powders, or perfumes.  You may not wear deodorant.  Do not shave 48 hours prior to surgery.   Do not bring valuables to the hospital.  [facility] is not responsible for any belongings or valuables.  Contacts, dentures or bridgework may not be worn into surgery.  Leave your suitcase in the car.  After surgery it may be brought to your room.  For patients admitted to the hospital, discharge time will be determined by your treatment team.  Patients discharged the day of surgery will not be allowed to drive home.   Name and phone number of your driver:   Special instructions:  [place] - Preparing for Surgery  Before surgery, you can play an important role.  Because skin is not sterile, your skin needs to be as free of germs as possible.  You can reduce the number of germs on you skin by washing with CHG (chlorahexidine gluconate) soap before surgery.  CHG is an antiseptic cleaner which kills germs and bonds with the  skin to continue killing germs even after washing.  Please DO NOT use if you have an allergy to CHG or antibacterial soaps.  If your skin becomes reddened/irritated stop using the CHG and inform your nurse when you arrive at Short Stay.  Do not shave (including legs and underarms) for at least 48 hours prior to the first CHG shower.  You may shave your face.  Please follow these instructions carefully:   1.  Shower with CHG Soap the night before surgery and the morning of Surgery.  2.  If you choose to wash your hair, wash your hair first as usual with your normal shampoo.  3.  After you shampoo, rinse your hair and body thoroughly to remove the Shampoo.  4.  Use CHG as you would any other liquid soap.  You can apply chg directly  to the skin and wash gently with scrungie or a clean washcloth.  5.  Apply the CHG Soap to your body ONLY FROM THE NECK DOWN.  Do not use on open wounds or open sores.  Avoid contact with your eyes, ears, mouth and genitals (private parts).  Wash genitals (private parts) with your normal soap.  6.  Wash thoroughly, paying special attention to the area where your surgery will be performed.  7.  Thoroughly rinse your body with warm water from the neck down.  8.  DO NOT shower/wash with your normal soap after using and rinsing off the CHG Soap.  9.  Pat yourself dry with a clean towel.            10.  Wear clean pajamas.            11.  Place clean sheets on your bed the night of your first shower and do not sleep with pets.  Day of Surgery  Do not apply any lotions/deodorants the morning of surgery.  Please wear clean clothes to the hospital/surgery center.  Please read over the following fact sheets that you were given. Pain Booklet, Coughing and Deep Breathing, MRSA Information and Surgical Site Infection Prevention

## 2015-10-25 NOTE — Progress Notes (Signed)
Pt denies any acute cardiopulmonary issues. Pt denies having a cardiac cath. Pt stated that she is not currently being treated by a cardiologist. Pt stated that she last saw a cardiologist in 2014 when she " fainted and had to wear a heart monitor ( see Care Everywhere)."  Pt stated that her last episode of syncope was July 2016. Pt  stated she is currently under the care of Dr. Manuella Ghazi, Neurology  and PCP is Dr. Emily Filbert, both of [facility], [place], [place]. Pt chart forwarded to anesthesia to review history.

## 2015-10-26 NOTE — Progress Notes (Addendum)
Anesthesia Chart Review:  Pt is a 75 year old female scheduled for L1-2 laminectomy for resection of intradural tumor on 11/03/2015 with Dr. Arnoldo Morale.   PCP is Dr. Emily Filbert, neurologist is Dr. Jennings Books (notes from both in care everywhere).   PMH includes:  Syncope, post-op N/V, GERD. Never smoker. BMI 28  Medications include: prevacid, potassium  Preoperative labs reviewed.    EKG 10/25/15: Sinus bradycardia (56 bpm). Left axis deviation. Incomplete RBBB. Septal infarct, age undetermined  Carotid duplex 06/21/15:  1. Minimal bilateral carotid atherosclerotic vascular disease, no flow limiting stenosis. Degree of stenosis less than 50% . 2. Vertebral arteries are patent antegrade flow.  Nuclear stress test 02/25/13 (care everywhere):  1. Attenuation artifacts anterior, inferior and inferolateral segments. 2. Possible mild reversible ischemia involving anterior lateral segment versus additional artifact. 3. Normal wall motion and normal ejection fraction 71% - notes in care everywhere from when this test was done indicate stress test reviewed by Dr. Ronnald Ramp with cardiology who felt test was normal.   Pt reports her last episode of syncope was 11/2014. Pt saw cardiology in 2015 (care everywhere) for syncope, an echo was recommended but didn't happen and pt never went back to cards. Notes from pt's previous PCP Dr. Campbell Stall (care everywhere) 04/22/15 indicate pt was referred to cardiology for syncope but this did not happen. Dr. Romeo Apple notes also indicate pt has had thorough neuro work up for syncope including head MRI, EEG and neuro conduction study.   Reviewed case with Dr. Deatra Canter. Pt will need cardiac eval for syncope prior to surgery. Left voicemail for Janett Billow in Dr. Arnoldo Morale' office.   Willeen Cass, FNP-BC [facility] Short Stay Surgical Center/Anesthesiology Phone: [phone] 10/27/2015 4:44 PM  Addendum:  Pt saw Dr. Nehemiah Massed with cardiology in [place] (care everywhere) and  ws cleared for surgery without further testing.   If no changes, I anticipate pt can proceed with surgery as scheduled.   Willeen Cass, FNP-BC [facility] Short Stay Surgical Center/Anesthesiology Phone: [phone] 11/02/2015 12:52 PM

## 2015-10-31 NOTE — Progress Notes (Signed)
Follow up phone message left with Janett Billow at Dr Adline Mango office to inquire about requested cardiac evaluation date/time.

## 2015-11-02 MED ORDER — CEFAZOLIN SODIUM-DEXTROSE 2-4 GM/100ML-% IV SOLN
2.0000 g | INTRAVENOUS | Status: AC
  Administered 2015-11-03: 2 g via INTRAVENOUS
  Filled 2015-11-02: qty 100

## 2015-11-03 ENCOUNTER — Encounter (HOSPITAL_COMMUNITY)
Admission: RE | Disposition: A | Discharge status: Home / Self Care | Payer: Self-pay | Source: Ambulatory Visit | Attending: Neurosurgery

## 2015-11-03 ENCOUNTER — Inpatient Hospital Stay (HOSPITAL_COMMUNITY): Payer: Medicare Other | Admitting: Anesthesiology

## 2015-11-03 ENCOUNTER — Inpatient Hospital Stay (HOSPITAL_COMMUNITY)
Admission: RE | Admit: 2015-11-03 | Discharge: 2015-11-04 | DRG: 520 | Disposition: A | Discharge status: Home / Self Care | Payer: Medicare Other | Source: Ambulatory Visit | Attending: Neurosurgery | Admitting: Neurosurgery

## 2015-11-03 ENCOUNTER — Encounter (HOSPITAL_COMMUNITY): Payer: Self-pay | Admitting: *Deleted

## 2015-11-03 ENCOUNTER — Inpatient Hospital Stay (HOSPITAL_COMMUNITY): Payer: Medicare Other

## 2015-11-03 ENCOUNTER — Inpatient Hospital Stay (HOSPITAL_COMMUNITY): Payer: Medicare Other | Admitting: Vascular Surgery

## 2015-11-03 DIAGNOSIS — Z419 Encounter for procedure for purposes other than remedying health state, unspecified: Secondary | ICD-10-CM

## 2015-11-03 DIAGNOSIS — M5416 Radiculopathy, lumbar region: Secondary | ICD-10-CM | POA: Diagnosis present

## 2015-11-03 DIAGNOSIS — M549 Dorsalgia, unspecified: Secondary | ICD-10-CM | POA: Diagnosis present

## 2015-11-03 DIAGNOSIS — Z90710 Acquired absence of both cervix and uterus: Secondary | ICD-10-CM

## 2015-11-03 DIAGNOSIS — D492 Neoplasm of unspecified behavior of bone, soft tissue, and skin: Secondary | ICD-10-CM | POA: Diagnosis present

## 2015-11-03 DIAGNOSIS — R32 Unspecified urinary incontinence: Secondary | ICD-10-CM | POA: Diagnosis present

## 2015-11-03 DIAGNOSIS — D3610 Benign neoplasm of peripheral nerves and autonomic nervous system, unspecified: Principal | ICD-10-CM | POA: Diagnosis present

## 2015-11-03 DIAGNOSIS — Z79899 Other long term (current) drug therapy: Secondary | ICD-10-CM | POA: Diagnosis not present

## 2015-11-03 DIAGNOSIS — K219 Gastro-esophageal reflux disease without esophagitis: Secondary | ICD-10-CM | POA: Diagnosis present

## 2015-11-03 HISTORY — PX: LAMINECTOMY: SHX219

## 2015-11-03 SURGERY — LUMBAR LAMINECTOMY FOR TUMOR
Anesthesia: General | Site: Spine Lumbar

## 2015-11-03 MED ORDER — OXYBUTYNIN CHLORIDE 5 MG PO TABS
5.0000 mg | ORAL_TABLET | Freq: Two times a day (BID) | ORAL | Status: DC
  Administered 2015-11-03 – 2015-11-04 (×2): 5 mg via ORAL
  Filled 2015-11-03 (×3): qty 1

## 2015-11-03 MED ORDER — CALCIUM CARBONATE-VITAMIN D 500-125 MG-UNIT PO TABS
1.0000 | ORAL_TABLET | ORAL | Status: DC
  Filled 2015-11-03: qty 1

## 2015-11-03 MED ORDER — ROCURONIUM BROMIDE 50 MG/5ML IV SOLN
INTRAVENOUS | Status: AC
  Filled 2015-11-03: qty 1

## 2015-11-03 MED ORDER — HEMOSTATIC AGENTS (NO CHARGE) OPTIME
TOPICAL | Status: DC | PRN
  Administered 2015-11-03: 1 via TOPICAL

## 2015-11-03 MED ORDER — MIDAZOLAM HCL 2 MG/2ML IJ SOLN
INTRAMUSCULAR | Status: AC
  Filled 2015-11-03: qty 2

## 2015-11-03 MED ORDER — MORPHINE SULFATE (PF) 2 MG/ML IV SOLN
INTRAVENOUS | Status: AC
  Filled 2015-11-03: qty 1

## 2015-11-03 MED ORDER — FENTANYL CITRATE (PF) 250 MCG/5ML IJ SOLN
INTRAMUSCULAR | Status: AC
  Filled 2015-11-03: qty 5

## 2015-11-03 MED ORDER — POTASSIUM 99 MG PO TABS: 1.0000 | ORAL_TABLET | Freq: Every day | ORAL | Status: DC

## 2015-11-03 MED ORDER — FENTANYL CITRATE (PF) 100 MCG/2ML IJ SOLN
25.0000 ug | INTRAMUSCULAR | Status: DC | PRN
  Administered 2015-11-03 (×3): 50 ug via INTRAVENOUS

## 2015-11-03 MED ORDER — HYDROCODONE-ACETAMINOPHEN 5-325 MG PO TABS
1.0000 | ORAL_TABLET | ORAL | Status: DC | PRN
  Administered 2015-11-03: 2 via ORAL

## 2015-11-03 MED ORDER — PANTOPRAZOLE SODIUM 40 MG PO TBEC
40.0000 mg | DELAYED_RELEASE_TABLET | Freq: Every day | ORAL | Status: DC
  Administered 2015-11-04: 40 mg via ORAL
  Filled 2015-11-03: qty 1

## 2015-11-03 MED ORDER — ONDANSETRON HCL 4 MG/2ML IJ SOLN: 4.0000 mg | INTRAMUSCULAR | Status: DC | PRN

## 2015-11-03 MED ORDER — LACTATED RINGERS IV SOLN
INTRAVENOUS | Status: DC
  Administered 2015-11-03: 08:00:00 via INTRAVENOUS

## 2015-11-03 MED ORDER — THROMBIN 5000 UNITS EX SOLR
OROMUCOSAL | Status: DC | PRN
  Administered 2015-11-03: 13:00:00 via TOPICAL

## 2015-11-03 MED ORDER — FLUTICASONE PROPIONATE 50 MCG/ACT NA SUSP
1.0000 | Freq: Every day | NASAL | Status: DC | PRN
  Filled 2015-11-03: qty 16

## 2015-11-03 MED ORDER — GLYCOPYRROLATE 0.2 MG/ML IJ SOLN
INTRAMUSCULAR | Status: DC | PRN
  Administered 2015-11-03: 0.4 mg via INTRAVENOUS

## 2015-11-03 MED ORDER — NEOSTIGMINE METHYLSULFATE 5 MG/5ML IV SOSY
PREFILLED_SYRINGE | INTRAVENOUS | Status: AC
  Filled 2015-11-03: qty 5

## 2015-11-03 MED ORDER — ALUM & MAG HYDROXIDE-SIMETH 200-200-20 MG/5ML PO SUSP: 30.0000 mL | Freq: Four times a day (QID) | ORAL | Status: DC | PRN

## 2015-11-03 MED ORDER — BUPIVACAINE-EPINEPHRINE 0.5% -1:200000 IJ SOLN
INTRAMUSCULAR | Status: DC | PRN
  Administered 2015-11-03: 5 mL

## 2015-11-03 MED ORDER — NEOSTIGMINE METHYLSULFATE 10 MG/10ML IV SOLN
INTRAVENOUS | Status: DC | PRN
  Administered 2015-11-03: 3 mg via INTRAVENOUS

## 2015-11-03 MED ORDER — BACITRACIN 50000 UNITS IM SOLR
INTRAMUSCULAR | Status: DC | PRN
  Administered 2015-11-03: 500 mL

## 2015-11-03 MED ORDER — LIDOCAINE-EPINEPHRINE 1 %-1:100000 IJ SOLN
INTRAMUSCULAR | Status: DC | PRN
Start: 2015-11-03 — End: 2015-11-03
  Administered 2015-11-03: 5 mL

## 2015-11-03 MED ORDER — THROMBIN 20000 UNITS EX SOLR
CUTANEOUS | Status: DC | PRN
  Administered 2015-11-03: 12:00:00 via TOPICAL

## 2015-11-03 MED ORDER — PHENYLEPHRINE 40 MCG/ML (10ML) SYRINGE FOR IV PUSH (FOR BLOOD PRESSURE SUPPORT)
PREFILLED_SYRINGE | INTRAVENOUS | Status: AC
  Filled 2015-11-03: qty 10

## 2015-11-03 MED ORDER — ONDANSETRON HCL 4 MG/2ML IJ SOLN
INTRAMUSCULAR | Status: AC
  Filled 2015-11-03: qty 2

## 2015-11-03 MED ORDER — DULOXETINE HCL 30 MG PO CPEP
30.0000 mg | ORAL_CAPSULE | Freq: Every day | ORAL | Status: DC
  Administered 2015-11-04: 30 mg via ORAL
  Filled 2015-11-03: qty 1

## 2015-11-03 MED ORDER — MENTHOL 3 MG MT LOZG: 1.0000 | LOZENGE | OROMUCOSAL | Status: DC | PRN

## 2015-11-03 MED ORDER — GLYCOPYRROLATE 0.2 MG/ML IV SOSY
PREFILLED_SYRINGE | INTRAVENOUS | Status: AC
  Filled 2015-11-03: qty 3

## 2015-11-03 MED ORDER — LACTATED RINGERS IV SOLN: INTRAVENOUS | Status: DC

## 2015-11-03 MED ORDER — FENTANYL CITRATE (PF) 100 MCG/2ML IJ SOLN
INTRAMUSCULAR | Status: AC
  Administered 2015-11-03: 50 ug via INTRAVENOUS
  Filled 2015-11-03: qty 2

## 2015-11-03 MED ORDER — 0.9 % SODIUM CHLORIDE (POUR BTL) OPTIME
TOPICAL | Status: DC | PRN
  Administered 2015-11-03: 1000 mL

## 2015-11-03 MED ORDER — EPHEDRINE SULFATE 50 MG/ML IJ SOLN
INTRAMUSCULAR | Status: DC | PRN
  Administered 2015-11-03 (×2): 10 mg via INTRAVENOUS

## 2015-11-03 MED ORDER — DIAZEPAM 5 MG PO TABS
5.0000 mg | ORAL_TABLET | Freq: Four times a day (QID) | ORAL | Status: DC | PRN
  Administered 2015-11-03 – 2015-11-04 (×3): 5 mg via ORAL
  Filled 2015-11-03 (×2): qty 1

## 2015-11-03 MED ORDER — BISACODYL 10 MG RE SUPP: 10.0000 mg | Freq: Every day | RECTAL | Status: DC | PRN

## 2015-11-03 MED ORDER — QC WOMENS DAILY MULTIVITAMIN PO TABS: 1.0000 | ORAL_TABLET | Freq: Every day | ORAL | Status: DC

## 2015-11-03 MED ORDER — LORATADINE 10 MG PO TABS
10.0000 mg | ORAL_TABLET | Freq: Every day | ORAL | Status: DC
  Administered 2015-11-04: 10 mg via ORAL
  Filled 2015-11-03: qty 1

## 2015-11-03 MED ORDER — FENTANYL CITRATE (PF) 100 MCG/2ML IJ SOLN
INTRAMUSCULAR | Status: AC
  Filled 2015-11-03: qty 2

## 2015-11-03 MED ORDER — ADULT MULTIVITAMIN W/MINERALS CH
1.0000 | ORAL_TABLET | Freq: Every day | ORAL | Status: DC
  Administered 2015-11-04: 1 via ORAL
  Filled 2015-11-03: qty 1

## 2015-11-03 MED ORDER — PHENOL 1.4 % MT LIQD: 1.0000 | OROMUCOSAL | Status: DC | PRN

## 2015-11-03 MED ORDER — MORPHINE SULFATE (PF) 2 MG/ML IV SOLN
1.0000 mg | INTRAVENOUS | Status: DC | PRN
  Administered 2015-11-03 (×2): 1 mg via INTRAVENOUS
  Administered 2015-11-03: 4 mg via INTRAVENOUS
  Filled 2015-11-03: qty 2

## 2015-11-03 MED ORDER — ROCURONIUM BROMIDE 100 MG/10ML IV SOLN
INTRAVENOUS | Status: DC | PRN
  Administered 2015-11-03: 40 mg via INTRAVENOUS
  Administered 2015-11-03: 10 mg via INTRAVENOUS
  Administered 2015-11-03: 20 mg via INTRAVENOUS

## 2015-11-03 MED ORDER — PROPOFOL 10 MG/ML IV BOLUS
INTRAVENOUS | Status: DC | PRN
  Administered 2015-11-03: 130 mg via INTRAVENOUS

## 2015-11-03 MED ORDER — DOCUSATE SODIUM 100 MG PO CAPS
100.0000 mg | ORAL_CAPSULE | Freq: Two times a day (BID) | ORAL | Status: DC
  Administered 2015-11-03 – 2015-11-04 (×2): 100 mg via ORAL
  Filled 2015-11-03 (×2): qty 1

## 2015-11-03 MED ORDER — BACITRACIN ZINC 500 UNIT/GM EX OINT
TOPICAL_OINTMENT | CUTANEOUS | Status: DC | PRN
Start: 2015-11-03 — End: 2015-11-03
  Administered 2015-11-03: 1 via TOPICAL

## 2015-11-03 MED ORDER — LIDOCAINE HCL (CARDIAC) 20 MG/ML IV SOLN
INTRAVENOUS | Status: DC | PRN
  Administered 2015-11-03: 100 mg via INTRAVENOUS

## 2015-11-03 MED ORDER — OXYCODONE-ACETAMINOPHEN 5-325 MG PO TABS
1.0000 | ORAL_TABLET | ORAL | Status: DC | PRN
  Administered 2015-11-03 – 2015-11-04 (×5): 2 via ORAL
  Filled 2015-11-03 (×4): qty 2

## 2015-11-03 MED ORDER — CALCIUM CARBONATE-VITAMIN D 500-200 MG-UNIT PO TABS
1.0000 | ORAL_TABLET | Freq: Every day | ORAL | Status: DC
  Administered 2015-11-04: 1 via ORAL
  Filled 2015-11-03: qty 1

## 2015-11-03 MED ORDER — PROPOFOL 10 MG/ML IV BOLUS
INTRAVENOUS | Status: AC
  Filled 2015-11-03: qty 20

## 2015-11-03 MED ORDER — FENTANYL CITRATE (PF) 100 MCG/2ML IJ SOLN
INTRAMUSCULAR | Status: DC | PRN
  Administered 2015-11-03: 100 ug via INTRAVENOUS
  Administered 2015-11-03: 150 ug via INTRAVENOUS

## 2015-11-03 MED ORDER — TRAZODONE HCL 50 MG PO TABS
50.0000 mg | ORAL_TABLET | Freq: Every day | ORAL | Status: DC
  Administered 2015-11-03: 50 mg via ORAL
  Filled 2015-11-03 (×2): qty 1

## 2015-11-03 MED ORDER — ACETAMINOPHEN 325 MG PO TABS: 650.0000 mg | ORAL_TABLET | ORAL | Status: DC | PRN

## 2015-11-03 MED ORDER — OXYCODONE-ACETAMINOPHEN 5-325 MG PO TABS
ORAL_TABLET | ORAL | Status: AC
  Filled 2015-11-03: qty 2

## 2015-11-03 MED ORDER — MIDAZOLAM HCL 5 MG/5ML IJ SOLN
INTRAMUSCULAR | Status: DC | PRN
  Administered 2015-11-03: 2 mg via INTRAVENOUS

## 2015-11-03 MED ORDER — CEFAZOLIN SODIUM-DEXTROSE 2-4 GM/100ML-% IV SOLN
2.0000 g | Freq: Three times a day (TID) | INTRAVENOUS | Status: AC
  Administered 2015-11-03 – 2015-11-04 (×2): 2 g via INTRAVENOUS
  Filled 2015-11-03 (×2): qty 100

## 2015-11-03 MED ORDER — ACETAMINOPHEN 650 MG RE SUPP: 650.0000 mg | RECTAL | Status: DC | PRN

## 2015-11-03 MED ORDER — ONDANSETRON HCL 4 MG/2ML IJ SOLN
INTRAMUSCULAR | Status: DC | PRN
  Administered 2015-11-03: 4 mg via INTRAVENOUS

## 2015-11-03 MED ORDER — DIAZEPAM 5 MG PO TABS
ORAL_TABLET | ORAL | Status: AC
  Filled 2015-11-03: qty 1

## 2015-11-03 MED ORDER — HYDROCODONE-ACETAMINOPHEN 5-325 MG PO TABS
ORAL_TABLET | ORAL | Status: AC
  Filled 2015-11-03: qty 2

## 2015-11-03 SURGICAL SUPPLY — 76 items
BAG DECANTER FOR FLEXI CONT (MISCELLANEOUS) ×2 IMPLANT
BENZOIN TINCTURE PRP APPL 2/3 (GAUZE/BANDAGES/DRESSINGS) ×2 IMPLANT
BLADE CLIPPER SURG (BLADE) IMPLANT
BLADE SURG 11 STRL SS (BLADE) ×2 IMPLANT
BLADE ULTRA TIP 2M (BLADE) IMPLANT
BRUSH SCRUB EZ 1% IODOPHOR (MISCELLANEOUS) IMPLANT
BUR MATCHSTICK NEURO 3.0 LAGG (BURR) ×2 IMPLANT
BUR PRECISION FLUTE 6.0 (BURR) IMPLANT
CANISTER SUCT 3000ML PPV (MISCELLANEOUS) ×2 IMPLANT
CLIP TI MEDIUM 6 (CLIP) ×2 IMPLANT
CONT SPEC 4OZ CLIKSEAL STRL BL (MISCELLANEOUS) ×2 IMPLANT
COTTONBALL LRG STERILE PKG (GAUZE/BANDAGES/DRESSINGS) IMPLANT
COVER MAYO STAND STRL (DRAPES) IMPLANT
DRAIN SNY 7 FPER (WOUND CARE) IMPLANT
DRAPE CAMERA VIDEO/LASER (DRAPES) IMPLANT
DRAPE LAPAROTOMY 100X72X124 (DRAPES) ×2 IMPLANT
DRAPE LONG LASER MIC (DRAPES) IMPLANT
DRAPE MICROSCOPE LEICA (MISCELLANEOUS) ×2 IMPLANT
DRAPE POUCH INSTRU U-SHP 10X18 (DRAPES) ×2 IMPLANT
DRAPE SHEET LG 3/4 BI-LAMINATE (DRAPES) ×2 IMPLANT
DRAPE SURG 17X23 STRL (DRAPES) ×8 IMPLANT
DURASEAL APPLICATOR TIP (TIP) ×2 IMPLANT
DURASEAL SPINE SEALANT 3ML (MISCELLANEOUS) ×2 IMPLANT
ELECT BLADE 4.0 EZ CLEAN MEGAD (MISCELLANEOUS) ×2
ELECT REM PT RETURN 9FT ADLT (ELECTROSURGICAL) ×2
ELECTRODE BLDE 4.0 EZ CLN MEGD (MISCELLANEOUS) ×1 IMPLANT
ELECTRODE REM PT RTRN 9FT ADLT (ELECTROSURGICAL) ×1 IMPLANT
EVACUATOR SILICONE 100CC (DRAIN) IMPLANT
GAUZE SPONGE 4X4 12PLY STRL (GAUZE/BANDAGES/DRESSINGS) ×2 IMPLANT
GAUZE SPONGE 4X4 16PLY XRAY LF (GAUZE/BANDAGES/DRESSINGS) IMPLANT
GLOVE BIO SURGEON STRL SZ8 (GLOVE) ×2 IMPLANT
GLOVE BIO SURGEON STRL SZ8.5 (GLOVE) ×2 IMPLANT
GLOVE BIOGEL PI IND STRL 7.5 (GLOVE) ×1 IMPLANT
GLOVE BIOGEL PI INDICATOR 7.5 (GLOVE) ×1
GLOVE EXAM NITRILE LRG STRL (GLOVE) IMPLANT
GLOVE EXAM NITRILE MD LF STRL (GLOVE) IMPLANT
GLOVE EXAM NITRILE XL STR (GLOVE) IMPLANT
GLOVE EXAM NITRILE XS STR PU (GLOVE) IMPLANT
GOWN STRL REUS W/ TWL LRG LVL3 (GOWN DISPOSABLE) ×4 IMPLANT
GOWN STRL REUS W/ TWL XL LVL3 (GOWN DISPOSABLE) ×1 IMPLANT
GOWN STRL REUS W/TWL LRG LVL3 (GOWN DISPOSABLE) ×4
GOWN STRL REUS W/TWL XL LVL3 (GOWN DISPOSABLE) ×1
HEMOSTAT POWDER KIT SURGIFOAM (HEMOSTASIS) ×2 IMPLANT
KIT BASIN OR (CUSTOM PROCEDURE TRAY) ×2 IMPLANT
KIT ROOM TURNOVER OR (KITS) ×2 IMPLANT
NEEDLE HYPO 21X1.5 SAFETY (NEEDLE) IMPLANT
NEEDLE HYPO 22GX1.5 SAFETY (NEEDLE) ×2 IMPLANT
NEEDLE SPNL 18GX3.5 QUINCKE PK (NEEDLE) ×2 IMPLANT
NS IRRIG 1000ML POUR BTL (IV SOLUTION) ×2 IMPLANT
PACK LAMINECTOMY NEURO (CUSTOM PROCEDURE TRAY) ×2 IMPLANT
PAD ARMBOARD 7.5X6 YLW CONV (MISCELLANEOUS) ×6 IMPLANT
PAD EYE OVAL STERILE LF (GAUZE/BANDAGES/DRESSINGS) IMPLANT
PATTIES SURGICAL .25X.25 (GAUZE/BANDAGES/DRESSINGS) ×2 IMPLANT
PATTIES SURGICAL .5 X.5 (GAUZE/BANDAGES/DRESSINGS) IMPLANT
PATTIES SURGICAL .5 X1 (DISPOSABLE) ×2 IMPLANT
PATTIES SURGICAL .5 X3 (DISPOSABLE) IMPLANT
PATTIES SURGICAL 1/4 X 3 (GAUZE/BANDAGES/DRESSINGS) IMPLANT
PATTIES SURGICAL 1X1 (DISPOSABLE) IMPLANT
RUBBERBAND STERILE (MISCELLANEOUS) ×4 IMPLANT
SPONGE LAP 4X18 X RAY DECT (DISPOSABLE) IMPLANT
SPONGE NEURO XRAY DETECT 1X3 (DISPOSABLE) IMPLANT
STAPLER SKIN PROX WIDE 3.9 (STAPLE) IMPLANT
STRIP CLOSURE SKIN 1/2X4 (GAUZE/BANDAGES/DRESSINGS) ×2 IMPLANT
SUT ETHILON 3 0 FSL (SUTURE) IMPLANT
SUT NURALON 4 0 TR CR/8 (SUTURE) ×2 IMPLANT
SUT PROLENE 6 0 BV (SUTURE) ×2 IMPLANT
SUT SILK 3 0 TIES 17X18 (SUTURE)
SUT SILK 3-0 18XBRD TIE BLK (SUTURE) IMPLANT
SUT VIC AB 1 CT1 18XBRD ANBCTR (SUTURE) ×1 IMPLANT
SUT VIC AB 1 CT1 8-18 (SUTURE) ×1
SUT VIC AB 2-0 CP2 18 (SUTURE) ×2 IMPLANT
TAPE CLOTH SURG 4X10 WHT LF (GAUZE/BANDAGES/DRESSINGS) ×2 IMPLANT
TOWEL OR 17X24 6PK STRL BLUE (TOWEL DISPOSABLE) ×2 IMPLANT
TOWEL OR 17X26 10 PK STRL BLUE (TOWEL DISPOSABLE) ×2 IMPLANT
TRAY FOLEY W/METER SILVER 16FR (SET/KITS/TRAYS/PACK) IMPLANT
WATER STERILE IRR 1000ML POUR (IV SOLUTION) ×2 IMPLANT

## 2015-11-03 NOTE — Anesthesia Postprocedure Evaluation (Signed)
Anesthesia Post Note  Patient: Heidi Cross  Procedure(s) Performed: Procedure(s) (LRB): L1-2 Laminectomy for resection of intradural tumor (N/A)  Patient location during evaluation: PACU Anesthesia Type: General Level of consciousness: sedated Pain management: satisfactory to patient Vital Signs Assessment: post-procedure vital signs reviewed and stable Respiratory status: spontaneous breathing Cardiovascular status: stable Anesthetic complications: no    Last Vitals:  Filed Vitals:   11/03/15 1515 11/03/15 1530  BP: 128/63   Pulse: 73 73  Temp:    Resp: 24 16    Last Pain:  Filed Vitals:   11/03/15 1540  PainSc: 7                  Rubbie Goostree EDWARD

## 2015-11-03 NOTE — Op Note (Signed)
Brief history: The patient is a 75 year old white female who has complained of back and right greater than left leg pain. She has failed medical management and was worked up with a lumbar MRI. This demonstrated a moderate sized intradural tumor at L1-2. I discussed the situation with the patient. We discussed the various treatment options. The patient has weighed the risks, benefits, and alternative surgery and decided see with a lumbar laminectomy with resection of her intradural tumor.  Preoperative diagnosis: L1-2 intradural tumor, lumbago, lumbar radiculopathy  Postoperative diagnosis: The same  Procedure: L1 and L2 laminectomy for gross total resection of intradural tumor using microdissection  Surgeon: Dr. Earle Gell  Assistant: Dr. Suezanne Jacquet Ditty  Anesthesia: Gen. tracheal  Estimated blood loss: Minimal  Specimens: Tumor  Drains: None  Coronal occasions: None  Description of procedure: The patient was brought to the operating room by the anesthesia team. General endotracheal anesthesia was induced. The patient was turned to the prone position on the Wilson frame. The patient's lumbosacral region was then prepared with Betadine scrub and Betadine solution. Sterile drapes were applied. I then injected the area to be incised with Marcaine with epinephrine solution. A scalpel to make a linear midline incision over the L1-2 interspace. I used electrocautery to perform a bilateral subperiosteal dissection exposing the spinous process and lamina of L1 and L2. We obtained intraoperative radiograph to confirm our location. I inserted the McCullough retractor for exposure.  I then incised interspinous ligament at L1-2 and T12-L1 with a scalpel. I removed the L1 spinous process with the Leksell rongeurs. I then used a high-speed drill to perform bilateral L1 laminotomies. I completed the L1 laminectomy with the Kerrison punches. I removed the T12-L1 ligamentum flavum and the L1-2 ligamentum flavum.  I removed the cephalad aspect of the L2 lamina. We then brought the operative microscope into the field and under his medication and illumination completed the microdissection. I used a 15 blade scalpel to create a small midline durotomy. I extended the durotomy in both cephalad and caudal direction with the [facility]. The arachnoid remained intact. We could see the tumor clearly through the arachnoid. We tacked up the dural edges with Nurolon sutures. I then incised arachnoid with the arachnoid knife. I tacked up the arachnoid to the dura using hemoclips. 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 I used microdissection to free up to nerve roots which were not involved with tumor but somewhat adherent to the surface of the tumor. We identified the filum terminalis. I coagulated the filum proximal to the tumor and ligated with the microscissors. I then coagulated the filum distal to the tumor and divided with microscissors. We then removed the tumor. We did not encounter any significant bleeding. I then irrigated the subarachnoid space out with saline and bacitracin solution. I then reapproximated the patient's dura with a running 6-0 Prolene suture. I placed DuraSeal over the durotomy. We obtained strange and hemostasis with electrocautery and FloSeal. I then removed the McCullough retractor and reapproximated patient's thoracolumbar fascia with interrupted #1 Vicryl suture. I reapproximated the subcutaneous tissue with interrupted 2-0 Vicryl suture. I reapproximated the skin with Steri-Strips and benzoin. The wound was then coated with bacitracin ointment. A sterile dressing was applied. The drapes were removed. The patient was subsequently returned to the supine position. By report all sponge, instrument, and needle counts were correct at the end of this case.

## 2015-11-03 NOTE — Anesthesia Preprocedure Evaluation (Signed)
Anesthesia Evaluation  Patient identified by MRN, date of birth, ID band Patient awake    Reviewed: Allergy & Precautions, H&P , Patient's Chart, lab work & pertinent test results, reviewed documented beta blocker date and time   Airway Mallampati: II  TM Distance: >3 FB Neck ROM: full    Dental no notable dental hx.    Pulmonary    Pulmonary exam normal breath sounds clear to auscultation       Cardiovascular  Rhythm:regular Rate:Normal     Neuro/Psych    GI/Hepatic GERD  Medicated,  Endo/Other    Renal/GU      Musculoskeletal   Abdominal   Peds  Hematology   Anesthesia Other Findings Hb 11.9 GERD    DDD (degenerative disc disease), lumbar   Peripheral neuropathy  Spinal cord tumor ........lumbar, 12mm    Syncope.... Seen by cardiology. Non cardiac in nature EF 70%     Shortness of breath          Reproductive/Obstetrics                             Anesthesia Physical Anesthesia Plan  ASA: II  Anesthesia Plan: General   Post-op Pain Management:    Induction: Intravenous  Airway Management Planned: Oral ETT  Additional Equipment:   Intra-op Plan:   Post-operative Plan: Extubation in OR  Informed Consent: I have reviewed the patients History and Physical, chart, labs and discussed the procedure including the risks, benefits and alternatives for the proposed anesthesia with the patient or authorized representative who has indicated his/her understanding and acceptance.   Dental Advisory Given and Dental advisory given  Plan Discussed with: CRNA and Surgeon  Anesthesia Plan Comments: (  Discussed general anesthesia, including possible nausea, instrumentation of airway, sore throat,pulmonary aspiration, etc. I asked if the were any outstanding questions, or  concerns before we proceeded. )        Anesthesia Quick Evaluation

## 2015-11-03 NOTE — Anesthesia Procedure Notes (Signed)
Procedure Name: Intubation Date/Time: 11/03/2015 10:53 AM Performed by: Mariea Clonts Pre-anesthesia Checklist: Emergency Drugs available, Patient identified, Timeout performed, Suction available and Patient being monitored Patient Re-evaluated:Patient Re-evaluated prior to inductionOxygen Delivery Method: Circle system utilized Preoxygenation: Pre-oxygenation with 100% oxygen Intubation Type: IV induction Ventilation: Mask ventilation without difficulty Laryngoscope Size: Miller and 2 Grade View: Grade III Tube type: Oral Number of attempts: 2 Airway Equipment and Method: Bougie stylet Placement Confirmation: ETT inserted through vocal cords under direct vision,  breath sounds checked- equal and bilateral and positive ETCO2 Tube secured with: Tape Dental Injury: Teeth and Oropharynx as per pre-operative assessment

## 2015-11-03 NOTE — Progress Notes (Signed)
Subjective:  The patient is somnolent but easily arousable. She looks well. She is in no apparent distress.  Objective: Vital signs in last 24 hours: Temp:  [97.9 F (36.6 C)] 97.9 F (36.6 C) (06/15 0808) Pulse Rate:  [61] 61 (06/15 0808) Resp:  [20] 20 (06/15 0808) BP: (112)/(48) 112/48 mmHg (06/15 0808) SpO2:  [98 %] 98 % (06/15 0808)  Intake/Output from previous day:   Intake/Output this shift: Total I/O In: 900 [I.V.:900] Out: 50 [Blood:50]  Physical exam the patient is somnolent but arousable. She is moving her lower extremities well.  Lab Results: No results for input(s): WBC, HGB, HCT, PLT in the last 72 hours. BMET No results for input(s): NA, K, CL, CO2, GLUCOSE, BUN, CREATININE, CALCIUM in the last 72 hours.  Studies/Results: Dg Lumbar Spine 2-3 Views  11/03/2015  CLINICAL DATA:  L1-L2 laminectomy for resection of intradural tumor. EXAM: LUMBAR SPINE - 2-3 VIEW COMPARISON:  MRI of the spine 08/19/2015 FINDINGS: Two intraoperative lateral radiographs of the lumbosacral spine demonstrate surgical instrument at the level of superior endplate of L2 vertebral body with tissue retractors posterior to it (film #2). The lumbosacral fracture is seen. IMPRESSION: Intraoperative radiographs from L1-L2 laminectomy. Electronically Signed   By: Fidela Salisbury M.D.   On: 11/03/2015 13:53    Assessment/Plan: The patient is doing well. I spoke with her family.  LOS: 0 days     Oluwaseyi Tull D 11/03/2015, 2:05 PM

## 2015-11-03 NOTE — Transfer of Care (Signed)
Immediate Anesthesia Transfer of Care Note  Patient: Heidi Cross  Procedure(s) Performed: Procedure(s) with comments: L1-2 Laminectomy for resection of intradural tumor (N/A) - L1-2 Laminectomy for resection of intradural tumor  Patient Location: PACU  Anesthesia Type:General  Level of Consciousness: awake, alert  and oriented  Airway & Oxygen Therapy: Patient Spontanous Breathing and Patient connected to nasal cannula oxygen  Post-op Assessment: Report given to RN, Post -op Vital signs reviewed and stable and Patient moving all extremities X 4  Post vital signs: Reviewed and stable  Last Vitals:  Filed Vitals:   11/03/15 0808  BP: 112/48  Pulse: 61  Temp: 36.6 C  Resp: 20    Last Pain:  Filed Vitals:   11/03/15 0812  PainSc: 5       Patients Stated Pain Goal: 4 ([id number])  Complications: No apparent anesthesia complications

## 2015-11-03 NOTE — H&P (Signed)
Subjective: The patient is a 75 year old white female who has complained of back and right greater than left leg pain. She has failed medical management and was worked up with a lumbar MRI. This demonstrated an intradural tumor at L1-2. We discussed the various treatment options including surgery. The patient has decided proceed with a lumbar laminectomy for resection of the tumor.   Past Medical History  Diagnosis Date  . PONV (postoperative nausea and vomiting)   . GERD (gastroesophageal reflux disease)   . DDD (degenerative disc disease), lumbar   . Peripheral neuropathy ([place])   . Cancer ([facility])     Basal Cell Carcinoma  . Environmental allergies   . Insomnia   . Spinal cord tumor ([place])     lumbar  . Wears contact lenses   . Syncope   . Shortness of breath dyspnea     with exertion  . Anemia     " when I was young and when I was pregnant "  . Mild atherosclerosis of both carotid arteries     Past Surgical History  Procedure Laterality Date  . Cholecystectomy    . Abdominal hysterectomy    . Tonsillectomy    . Colonoscopy w/ biopsies and polypectomy    . Dilation and curettage of uterus      No Known Allergies  Social History  Substance Use Topics  . Smoking status: Never Smoker   . Smokeless tobacco: Never Used  . Alcohol Use: Yes     Comment: occasional wine    Family History  Problem Relation Age of Onset  . Diabetes Mother   . Cancer Mother   . Hypertension Mother   . Cancer Father    Prior to Admission medications   Medication Sig Start Date End Date Taking? Authorizing Provider  Calcium Carbonate-Vitamin D (CALCIUM 500 + D) 500-125 MG-UNIT TABS Take 1 tablet by mouth every other day.   Yes Historical Provider, MD  DULoxetine (CYMBALTA) 30 MG capsule Take 30 mg by mouth daily.   Yes Historical Provider, MD  fexofenadine (ALLEGRA) 180 MG tablet Take 180 mg by mouth daily.   Yes Historical Provider, MD  fluticasone (FLONASE) 50 MCG/ACT nasal spray Place 1 spray  into both nostrils daily as needed for allergies or rhinitis.   Yes Historical Provider, MD  Glucosamine-Chondroitin 500-400 MG CAPS Take 1 tablet by mouth daily.  04/15/08  Yes Historical Provider, MD  HYDROcodone-acetaminophen (NORCO/VICODIN) 5-325 MG tablet Take 1 tablet by mouth every 6 (six) hours as needed for moderate pain.   Yes Historical Provider, MD  lansoprazole (PREVACID) 30 MG capsule Take 30 mg by mouth daily at 12 noon.   Yes Historical Provider, MD  Multiple Vitamins-Minerals (QC WOMENS DAILY MULTIVITAMIN) TABS Take 1 tablet by mouth daily.  04/15/08  Yes Historical Provider, MD  oxybutynin (DITROPAN) 5 MG tablet Take 5 mg by mouth 2 (two) times daily.  07/21/15 11/03/15 Yes Historical Provider, MD  Potassium 99 MG TABS Take 1 tablet by mouth daily.    Yes Historical Provider, MD  traZODone (DESYREL) 50 MG tablet Take 50 mg by mouth at bedtime.   Yes Historical Provider, MD     Review of Systems  Positive ROS: As above  All other systems have been reviewed and were otherwise negative with the exception of those mentioned in the HPI and as above.  Objective: Vital signs in last 24 hours: Temp:  [97.9 F (36.6 C)] 97.9 F (36.6 C) (06/15 0808) Pulse Rate:  [  61] 61 (06/15 0808) Resp:  [20] 20 (06/15 0808) BP: (112)/(48) 112/48 mmHg (06/15 0808) SpO2:  [98 %] 98 % (06/15 0808)  General Appearance: Alert, cooperative, no distress, Head: Normocephalic, without obvious abnormality, atraumatic Eyes: PERRL, conjunctiva/corneas clear, EOM's intact,    Ears: Normal  Throat: Normal  Neck: Supple, symmetrical, trachea midline, no adenopathy; thyroid: No enlargement/tenderness/nodules; no carotid bruit or JVD Back: Symmetric, no curvature, ROM normal, no CVA tenderness Lungs: Clear to auscultation bilaterally, respirations unlabored Heart: Regular rate and rhythm, no murmur, rub or gallop Abdomen: Soft, non-tender,, no masses, no organomegaly Extremities: Extremities normal,  atraumatic, no cyanosis or edema Pulses: 2+ and symmetric all extremities Skin: Skin color, texture, turgor normal, no rashes or lesions  NEUROLOGIC:   Mental status: alert and oriented, no aphasia, good attention span, Fund of knowledge/ memory ok Motor Exam - grossly normal Sensory Exam - grossly normal Reflexes:  Coordination - grossly normal Gait - grossly normal Balance - grossly normal Cranial Nerves: I: smell Not tested  II: visual acuity  OS: Normal  OD: Normal   II: visual fields Full to confrontation  II: pupils Equal, round, reactive to light  III,VII: ptosis None  III,IV,VI: extraocular muscles  Full ROM  V: mastication Normal  V: facial light touch sensation  Normal  V,VII: corneal reflex  Present  VII: facial muscle function - upper  Normal  VII: facial muscle function - lower Normal  VIII: hearing Not tested  IX: soft palate elevation  Normal  IX,X: gag reflex Present  XI: trapezius strength  5/5  XI: sternocleidomastoid strength 5/5  XI: neck flexion strength  5/5  XII: tongue strength  Normal    Data Review Lab Results  Component Value Date   WBC 5.0 10/25/2015   HGB 11.9* 10/25/2015   HCT 35.7* 10/25/2015   MCV 91.1 10/25/2015   PLT 230 10/25/2015   Lab Results  Component Value Date   NA 134* 10/25/2015   K 4.2 10/25/2015   CL 99* 10/25/2015   CO2 28 10/25/2015   BUN 10 10/25/2015   CREATININE 0.82 10/25/2015   GLUCOSE 99 10/25/2015   No results found for: INR, PROTIME  Assessment/Plan: L1 2 intradural tumor, lumbago, lumbar radiculopathy: I have discussed situation with the patient. I have reviewed her imaging studies with her and pointed out the abnormalities. We have discussed the various treatment options including surgery. I have described the surgical treatment option of a lumbar laminectomy with resection of the tumor. I have shown her surgical models. We have discussed the risks, benefits, alternatives, and likelihood of achieving her  goals for surgery. I have answered all the patient's questions. She has decided to proceed with surgery.   [name] 11/03/2015 10:40 AM

## 2015-11-04 ENCOUNTER — Encounter (HOSPITAL_COMMUNITY): Payer: Self-pay | Admitting: Neurosurgery

## 2015-11-04 MED ORDER — CYCLOBENZAPRINE HCL 5 MG PO TABS: 5.0000 mg | ORAL_TABLET | Freq: Three times a day (TID) | ORAL | Status: DC | PRN

## 2015-11-04 MED ORDER — HYDROCODONE-ACETAMINOPHEN 5-325 MG PO TABS: 1.0000 | ORAL_TABLET | ORAL | Status: DC | PRN

## 2015-11-04 MED ORDER — DOCUSATE SODIUM 100 MG PO CAPS: 100.0000 mg | ORAL_CAPSULE | Freq: Two times a day (BID) | ORAL | Status: DC

## 2015-11-04 NOTE — Discharge Summary (Signed)
Physician Discharge Summary  Patient ID: NARYAH PLATT MRN: [id number] DOB/AGE: 1940/06/20 75 y.o.  Admit date: 11/03/2015 Discharge date: 11/04/2015  Admission Diagnoses:Spinal cord tumor, lumbago, lumbar radiculopathy  Discharge Diagnoses: The same Active Problems:   Lumbar spine tumor   Discharged Condition: good  Hospital Course: I performed an L1 and L2 laminectomy for gross total resection of a spinal cord tumor. The surgery went well.  The patient's postoperative course was unremarkable. On postoperative day #1 the patient requested discharge home. She was given written and oral discharge instructions. All her questions were answered. She was pleased that her preoperative incontinence has resolved since surgery.  Consults: Physical therapy Significant Diagnostic Studies: None Treatments: L1-2 laminectomy for gross total resection of spinal cord tumor using microdissection. Discharge Exam: Blood pressure 111/37, pulse 74, temperature 99.4 F (37.4 C), temperature source Oral, resp. rate 18, SpO2 96 %. The patient is alert and pleasant. Her strength is grossly normal in her lower extremities. Her dressing is clean and dry. She is not having any headaches.  Disposition: Home  Discharge Instructions    Call MD for:  difficulty breathing, headache or visual disturbances    Complete by:  As directed      Call MD for:  extreme fatigue    Complete by:  As directed      Call MD for:  hives    Complete by:  As directed      Call MD for:  persistant dizziness or light-headedness    Complete by:  As directed      Call MD for:  persistant nausea and vomiting    Complete by:  As directed      Call MD for:  redness, tenderness, or signs of infection (pain, swelling, redness, odor or green/yellow discharge around incision site)    Complete by:  As directed      Call MD for:  severe uncontrolled pain    Complete by:  As directed      Call MD for:  temperature >100.4    Complete  by:  As directed      Diet - low sodium heart healthy    Complete by:  As directed      Discharge instructions    Complete by:  As directed   Call [phone] for a followup appointment. Take a stool softener while you are using pain medications.     Driving Restrictions    Complete by:  As directed   Do not drive for 2 weeks.     Increase activity slowly    Complete by:  As directed      Lifting restrictions    Complete by:  As directed   Do not lift more than 5 pounds. No excessive bending or twisting.     May shower / Bathe    Complete by:  As directed   He may shower after the pain she is removed 3 days after surgery. Leave the incision alone.     Remove dressing in 48 hours    Complete by:  As directed   Your stitches are under the scan and will dissolve by themselves. The Steri-Strips will fall off after you take a few showers. Do not rub back or pick at the wound, Leave the wound alone.            Medication List    STOP taking these medications        oxybutynin 5 MG tablet  Commonly known as:  DITROPAN      TAKE these medications        CALCIUM 500 + D 500-125 MG-UNIT Tabs  Generic drug:  Calcium Carbonate-Vitamin D  Take 1 tablet by mouth every other day.     cyclobenzaprine 5 MG tablet  Commonly known as:  FLEXERIL  Take 1 tablet (5 mg total) by mouth 3 (three) times daily as needed for muscle spasms.     docusate sodium 100 MG capsule  Commonly known as:  COLACE  Take 1 capsule (100 mg total) by mouth 2 (two) times daily.     DULoxetine 30 MG capsule  Commonly known as:  CYMBALTA  Take 30 mg by mouth daily.     fexofenadine 180 MG tablet  Commonly known as:  ALLEGRA  Take 180 mg by mouth daily.     fluticasone 50 MCG/ACT nasal spray  Commonly known as:  FLONASE  Place 1 spray into both nostrils daily as needed for allergies or rhinitis.     Glucosamine-Chondroitin 500-400 MG Caps  Take 1 tablet by mouth daily.     HYDROcodone-acetaminophen  5-325 MG tablet  Commonly known as:  NORCO/VICODIN  Take 1-2 tablets by mouth every 4 (four) hours as needed for moderate pain.     lansoprazole 30 MG capsule  Commonly known as:  PREVACID  Take 30 mg by mouth daily at 12 noon.     Potassium 99 MG Tabs  Take 1 tablet by mouth daily.     QC WOMENS DAILY MULTIVITAMIN Tabs  Take 1 tablet by mouth daily.     traZODone 50 MG tablet  Commonly known as:  DESYREL  Take 50 mg by mouth at bedtime.         SignedOphelia Charter 11/04/2015, 7:45 AM

## 2015-11-04 NOTE — Progress Notes (Signed)
Pt doing well. Pt and family given D/C instructions with Rx's, verbal understanding was provided. Pt's incision is clean and dry with no sign of infection. Pt's IV was removed prior to D/C. Pt D/C'd home via wheelchair @ 1125 per MD order. Pt is stable @ D/C and has no other needs at this time. Holli Humbles, RN

## 2015-11-04 NOTE — Discharge Instructions (Signed)

## 2015-11-04 NOTE — Evaluation (Signed)
Physical Therapy Evaluation Patient Details Name: Heidi Cross MRN: [id number] DOB: 01-21-1941 Today's Date: 11/04/2015   History of Present Illness  Pt is a 75 y/o female who presents s/p L1-L2 laminectomy for tumor removal on 11/03/15.  Clinical Impression  Pt admitted with above diagnosis. Pt currently with functional limitations due to the deficits listed below (see PT Problem List). At the time of PT eval pt was able to perform transfers and ambulation with min guard to close supervision for safety. Pt with lingering RLE weakness during standing activity consistent with pre-op diagnosis. Recommend follow-up at outpatient PT for continued RLE strengthening when cleared by MD. Pt will benefit from skilled PT to increase their independence and safety with mobility to allow discharge to the venue listed below.       Follow Up Recommendations Outpatient PT;Supervision for mobility/OOB    Equipment Recommendations  None recommended by PT    Recommendations for Other Services       Precautions / Restrictions Precautions Precautions: Fall;Back Precaution Booklet Issued: Yes (comment) Precaution Comments: Reviewed handout and pt was cued for precautions during functional mobility.  Restrictions Weight Bearing Restrictions: No      Mobility  Bed Mobility Overal bed mobility: Needs Assistance Bed Mobility: Rolling;Sidelying to Sit Rolling: Supervision Sidelying to sit: Supervision       General bed mobility comments: Close supervision for safety. Pt was able to transition to EOB with cues for sequencing and proper log roll technique.   Transfers Overall transfer level: Needs assistance Equipment used: Rolling walker (2 wheeled) Transfers: Sit to/from Stand Sit to Stand: Min assist;Min guard         General transfer comment: Assist required from bed in lowest position. No assist required from elevated surface. VC's for hand placement on seated surface for safety.    Ambulation/Gait Ambulation/Gait assistance: Min guard;Supervision Ambulation Distance (Feet): 300 Feet Assistive device: Rolling walker (2 wheeled) Gait Pattern/deviations: Step-through pattern;Decreased stride length;Trunk flexed Gait velocity: Decreased Gait velocity interpretation: Below normal speed for age/gender General Gait Details: Pt was able to ambulate fairly well in the hall. VC's for increased heel strike and improved posture. Pt was able to maintain for a short time however not able to maintain without additional cues.   Stairs Stairs: Yes Stairs assistance: Min guard Stair Management: One rail Right;Step to pattern;Forwards;Sideways Number of Stairs: 3 General stair comments: Pt was able to complete 3 stairs ascending with HHA on L and railing on R. When descending, pt held to the right railing and came down sideways.  Wheelchair Mobility    Modified Rankin (Stroke Patients Only)       Balance Overall balance assessment: Needs assistance Sitting-balance support: Feet supported;No upper extremity supported Sitting balance-Leahy Scale: Fair     Standing balance support: Bilateral upper extremity supported;During functional activity Standing balance-Leahy Scale: Poor Standing balance comment: Requires UE support to maintain standing balance.                              Pertinent Vitals/Pain Pain Assessment: Faces Faces Pain Scale: Hurts little more Pain Location: incision site Pain Descriptors / Indicators: Operative site guarding;Sore Pain Intervention(s): Limited activity within patient's tolerance;Monitored during session;Repositioned    Home Living Family/patient expects to be discharged to:: Private residence Living Arrangements: Spouse/significant other Available Help at Discharge: Family;Available 24 hours/day Type of Home: House Home Access: Stairs to enter Entrance Stairs-Rails: Right Entrance Stairs-Number of Steps: 2 Home  Layout: One level Home Equipment: Walker - 2 wheels;Cane - single point;Shower seat - built in;Grab bars - toilet      Prior Function Level of Independence: Independent with assistive device(s)         Comments: Pt was using a SPC for mobility - husband was providing supervision for stairs     Hand Dominance   Dominant Hand: Right    Extremity/Trunk Assessment   Upper Extremity Assessment: Overall WFL for tasks assessed           Lower Extremity Assessment: RLE deficits/detail RLE Deficits / Details: Noted drop foot on the R side. Decreased strength and AROM consistent with pre-op diagnosis.     Cervical / Trunk Assessment: Normal  Communication   Communication: No difficulties  Cognition Arousal/Alertness: Awake/alert Behavior During Therapy: WFL for tasks assessed/performed Overall Cognitive Status: Within Functional Limits for tasks assessed                      General Comments      Exercises        Assessment/Plan    PT Assessment Patient needs continued PT services  PT Diagnosis Difficulty walking;Acute pain   PT Problem List Decreased strength;Decreased range of motion;Decreased activity tolerance;Decreased balance;Decreased mobility;Decreased knowledge of use of DME;Decreased knowledge of precautions;Decreased safety awareness;Pain  PT Treatment Interventions DME instruction;Gait training;Stair training;Functional mobility training;Therapeutic activities;Therapeutic exercise;Neuromuscular re-education;Patient/family education   PT Goals (Current goals can be found in the Care Plan section) Acute Rehab PT Goals Patient Stated Goal: Home today PT Goal Formulation: With patient/family Time For Goal Achievement: 11/11/15 Potential to Achieve Goals: Good    Frequency Min 5X/week   Barriers to discharge        Co-evaluation               End of Session Equipment Utilized During Treatment: Gait belt Activity Tolerance: Patient  tolerated treatment well Patient left: with call bell/phone within reach;with family/visitor present (Sitting EOB with husband present) Nurse Communication: Mobility status         Time: [id number] PT Time Calculation (min) (ACUTE ONLY): 37 min   Charges:   PT Evaluation $PT Eval Moderate Complexity: 1 Procedure PT Treatments $Gait Training: 8-22 mins   PT G Codes:        Rolinda Roan Nov 19, 2015, 11:30 AM   Rolinda Roan, PT, DPT Acute Rehabilitation Services Pager: [phone]

## 2016-07-12 ENCOUNTER — Other Ambulatory Visit: Payer: Self-pay | Admitting: Internal Medicine

## 2016-07-12 DIAGNOSIS — Z1231 Encounter for screening mammogram for malignant neoplasm of breast: Secondary | ICD-10-CM

## 2016-08-06 ENCOUNTER — Ambulatory Visit
Admission: RE | Admit: 2016-08-06 | Discharge: 2016-08-06 | Disposition: A | Discharge status: Home / Self Care | Payer: Medicare HMO | Source: Ambulatory Visit | Attending: Internal Medicine | Admitting: Internal Medicine

## 2016-08-06 DIAGNOSIS — Z1231 Encounter for screening mammogram for malignant neoplasm of breast: Secondary | ICD-10-CM

## 2016-12-17 ENCOUNTER — Emergency Department: Payer: Medicare HMO

## 2016-12-17 ENCOUNTER — Emergency Department
Admission: EM | Admit: 2016-12-17 | Discharge: 2016-12-17 | Disposition: A | Discharge status: Home / Self Care | Payer: Medicare HMO | Attending: Emergency Medicine | Admitting: Emergency Medicine

## 2016-12-17 ENCOUNTER — Encounter: Payer: Self-pay | Admitting: Emergency Medicine

## 2016-12-17 DIAGNOSIS — K219 Gastro-esophageal reflux disease without esophagitis: Secondary | ICD-10-CM | POA: Diagnosis not present

## 2016-12-17 DIAGNOSIS — R079 Chest pain, unspecified: Secondary | ICD-10-CM | POA: Diagnosis not present

## 2016-12-17 DIAGNOSIS — Z85828 Personal history of other malignant neoplasm of skin: Secondary | ICD-10-CM | POA: Diagnosis not present

## 2016-12-17 DIAGNOSIS — Z79899 Other long term (current) drug therapy: Secondary | ICD-10-CM | POA: Insufficient documentation

## 2016-12-17 LAB — URINALYSIS, COMPLETE (UACMP) WITH MICROSCOPIC
BACTERIA UA: NONE SEEN
Bilirubin Urine: NEGATIVE
Glucose, UA: NEGATIVE mg/dL
HGB URINE DIPSTICK: NEGATIVE
Ketones, ur: NEGATIVE mg/dL
NITRITE: NEGATIVE
PROTEIN: NEGATIVE mg/dL
SPECIFIC GRAVITY, URINE: 1.009 (ref 1.005–1.030)
pH: 6 (ref 5.0–8.0)

## 2016-12-17 LAB — CBC
HEMATOCRIT: 38.3 % (ref 35.0–47.0)
HEMOGLOBIN: 12.9 g/dL (ref 12.0–16.0)
MCH: 32.1 pg (ref 26.0–34.0)
MCHC: 33.7 g/dL (ref 32.0–36.0)
MCV: 95.2 fL (ref 80.0–100.0)
Platelets: 361 10*3/uL (ref 150–440)
RBC: 4.03 MIL/uL (ref 3.80–5.20)
RDW: 13.7 % (ref 11.5–14.5)
WBC: 8.5 10*3/uL (ref 3.6–11.0)

## 2016-12-17 LAB — BASIC METABOLIC PANEL
Anion gap: 7 (ref 5–15)
BUN: 15 mg/dL (ref 6–20)
CHLORIDE: 96 mmol/L — AB (ref 101–111)
CO2: 28 mmol/L (ref 22–32)
Calcium: 9.7 mg/dL (ref 8.9–10.3)
Creatinine, Ser: 0.98 mg/dL (ref 0.44–1.00)
GFR calc Af Amer: >60 mL/min (ref >60)
GFR calc non Af Amer: 56 mL/min — ABNORMAL LOW (ref >60)
GLUCOSE: 96 mg/dL (ref 65–99)
POTASSIUM: 4.7 mmol/L (ref 3.5–5.1)
Sodium: 131 mmol/L — ABNORMAL LOW (ref 135–145)

## 2016-12-17 LAB — TROPONIN I: Troponin I: <0.03 ng/mL (ref <0.03)

## 2016-12-17 LAB — HEPATIC FUNCTION PANEL
ALBUMIN: 4.1 g/dL (ref 3.5–5.0)
ALT: 23 U/L (ref 14–54)
AST: 21 U/L (ref 15–41)
Alkaline Phosphatase: 64 U/L (ref 38–126)
BILIRUBIN TOTAL: 0.8 mg/dL (ref 0.3–1.2)
TOTAL PROTEIN: 6.6 g/dL (ref 6.5–8.1)

## 2016-12-17 LAB — LIPASE, BLOOD: Lipase: 32 U/L (ref 11–51)

## 2016-12-17 MED ORDER — ALUM & MAG HYDROXIDE-SIMETH 400-400-40 MG/5ML PO SUSP: 5.0000 mL | Freq: Four times a day (QID) | ORAL | 0 refills | Status: DC | PRN

## 2016-12-17 MED ORDER — ASPIRIN 81 MG PO CHEW
324.0000 mg | CHEWABLE_TABLET | Freq: Once | ORAL | Status: AC
  Administered 2016-12-17: 324 mg via ORAL
  Filled 2016-12-17: qty 4

## 2016-12-17 MED ORDER — METOCLOPRAMIDE HCL 5 MG/ML IJ SOLN
10.0000 mg | Freq: Once | INTRAMUSCULAR | Status: AC
  Administered 2016-12-17: 10 mg via INTRAVENOUS
  Filled 2016-12-17: qty 2

## 2016-12-17 MED ORDER — MORPHINE SULFATE (PF) 2 MG/ML IV SOLN
2.0000 mg | Freq: Once | INTRAVENOUS | Status: AC
  Administered 2016-12-17: 2 mg via INTRAVENOUS
  Filled 2016-12-17: qty 1

## 2016-12-17 MED ORDER — ONDANSETRON HCL 4 MG/2ML IJ SOLN
4.0000 mg | Freq: Once | INTRAMUSCULAR | Status: AC
  Administered 2016-12-17: 4 mg via INTRAVENOUS
  Filled 2016-12-17: qty 2

## 2016-12-17 MED ORDER — FAMOTIDINE IN NACL 20-0.9 MG/50ML-% IV SOLN
20.0000 mg | Freq: Once | INTRAVENOUS | Status: AC
  Administered 2016-12-17: 20 mg via INTRAVENOUS
  Filled 2016-12-17: qty 50

## 2016-12-17 MED ORDER — IOPAMIDOL (ISOVUE-370) INJECTION 76%
75.0000 mL | Freq: Once | INTRAVENOUS | Status: AC | PRN
  Administered 2016-12-17: 75 mL via INTRAVENOUS

## 2016-12-17 MED ORDER — SODIUM CHLORIDE 0.9 % IV BOLUS (SEPSIS)
1000.0000 mL | Freq: Once | INTRAVENOUS | Status: AC
  Administered 2016-12-17: 1000 mL via INTRAVENOUS

## 2016-12-17 MED ORDER — GI COCKTAIL ~~LOC~~
30.0000 mL | Freq: Once | ORAL | Status: AC
  Administered 2016-12-17: 30 mL via ORAL
  Filled 2016-12-17: qty 30

## 2016-12-17 NOTE — Discharge Instructions (Signed)

## 2016-12-17 NOTE — ED Provider Notes (Signed)
[facility] Emergency Department Provider Note  ____________________________________________  Time seen: Approximately 5:39 PM  I have reviewed the triage vital signs and the nursing notes.   HISTORY  Chief Complaint Chest Pain   HPI SHANIN SZYMANOWSKI is a 76 y.o. female with a history of chronic back pain and GERD who presents for evaluation of chest pain. Patient reports that her pain has been constant since 12:30PM, sharp, located in the center of her chest, radiating to her back between her shoulder blades, 6/10, associated with nausea. Pain is worse with deep inspiration. Patient has had a dry cough for 2 months. No fever or chills. No shortness of breath. No abdominal pain. No vomiting or diarrhea, no dysuria no hematuria, no constipation. Patient has had her gallbladder removed many years ago. Has never had pain like this before.Patient is not a smoker, no personal or family history of ischemic heart disease or blood clots, no recent travel or immobilization, no leg pain or swelling, no hemoptysis, no exogenous hormones.  Past Medical History:  Diagnosis Date  . Anemia    " when I was young and when I was pregnant "  . Cancer (HCC)    Basal Cell Carcinoma  . DDD (degenerative disc disease), lumbar   . Environmental allergies   . GERD (gastroesophageal reflux disease)   . Insomnia   . Mild atherosclerosis of both carotid arteries   . Peripheral neuropathy   . PONV (postoperative nausea and vomiting)   . Shortness of breath dyspnea    with exertion  . Spinal cord tumor    lumbar  . Syncope   . Wears contact lenses     Patient Active Problem List   Diagnosis Date Noted  . Lumbar spine tumor 11/03/2015    Past Surgical History:  Procedure Laterality Date  . ABDOMINAL HYSTERECTOMY    . CHOLECYSTECTOMY    . COLONOSCOPY W/ BIOPSIES AND POLYPECTOMY    . DILATION AND CURETTAGE OF UTERUS    . LAMINECTOMY N/A 11/03/2015   Procedure: L1-2  Laminectomy for resection of intradural tumor;  Surgeon: Newman Pies, MD;  Location: [facility];  Service: Neurosurgery;  Laterality: N/A;  L1-2 Laminectomy for resection of intradural tumor  . TONSILLECTOMY      Prior to Admission medications   Medication Sig Start Date End Date Taking? Authorizing Provider  alum & mag hydroxide-simeth (MAALOX MAX) 400-400-40 MG/5ML suspension Take 5 mLs by mouth every 6 (six) hours as needed for indigestion. 12/17/16   Rudene Re, MD  Calcium Carbonate-Vitamin D (CALCIUM 500 + D) 500-125 MG-UNIT TABS Take 1 tablet by mouth every other day.    [provider]  cyclobenzaprine (FLEXERIL) 5 MG tablet Take 1 tablet (5 mg total) by mouth 3 (three) times daily as needed for muscle spasms. 11/04/15   Newman Pies, MD  docusate sodium (COLACE) 100 MG capsule Take 1 capsule (100 mg total) by mouth 2 (two) times daily. 11/04/15   Newman Pies, MD  DULoxetine (CYMBALTA) 30 MG capsule Take 30 mg by mouth daily.    [provider]  fexofenadine (ALLEGRA) 180 MG tablet Take 180 mg by mouth daily.    [provider]  fluticasone (FLONASE) 50 MCG/ACT nasal spray Place 1 spray into both nostrils daily as needed for allergies or rhinitis.    [provider]  Glucosamine-Chondroitin 500-400 MG CAPS Take 1 tablet by mouth daily.  04/15/08   [provider]  HYDROcodone-acetaminophen (NORCO/VICODIN) 5-325 MG  tablet Take 1-2 tablets by mouth every 4 (four) hours as needed for moderate pain. 11/04/15   Newman Pies, MD  lansoprazole (PREVACID) 30 MG capsule Take 30 mg by mouth daily at 12 noon.    [provider]  Multiple Vitamins-Minerals (QC WOMENS DAILY MULTIVITAMIN) TABS Take 1 tablet by mouth daily.  04/15/08   [provider]  Potassium 99 MG TABS Take 1 tablet by mouth daily.     [provider]  traZODone (DESYREL) 50 MG tablet Take 50 mg by mouth at bedtime.    [provider]    Allergies Patient has no known allergies.  Family History  Problem Relation Age of Onset  . Diabetes Mother   . Cancer Mother   . Hypertension Mother   . Cancer Father     Social History Social History  Substance Use Topics  . Smoking status: Never Smoker  . Smokeless tobacco: Never Used  . Alcohol use Yes     Comment: occasional wine    Review of Systems  Constitutional: Negative for fever. Eyes: Negative for visual changes. ENT: Negative for sore throat. Neck: No neck pain  Cardiovascular: + chest pain. Respiratory: Negative for shortness of breath. Gastrointestinal: Negative for abdominal pain, vomiting or diarrhea. + nausea Genitourinary: Negative for dysuria. Musculoskeletal: Negative for back pain. Skin: Negative for rash. Neurological: Negative for headaches, weakness or numbness. Psych: No SI or HI  ____________________________________________   PHYSICAL EXAM:  VITAL SIGNS: ED Triage Vitals  Enc Vitals Group     BP 12/17/16 1437 (!) 128/55     Pulse Rate 12/17/16 1437 64     Resp 12/17/16 1437 16     Temp 12/17/16 1437 97.7 F (36.5 C)     Temp Source 12/17/16 1437 Oral     SpO2 12/17/16 1437 97 %     Weight 12/17/16 1438 196 lb (88.9 kg)     Height 12/17/16 1438 5\' 9"  (1.753 m)     Head Circumference --      Peak Flow --      Pain Score 12/17/16 1451 6     Pain Loc --      Pain Edu? --      Excl. in [place]? --     Constitutional: Alert and oriented. Well appearing and in no apparent distress. HEENT:      Head: Normocephalic and atraumatic.         Eyes: Conjunctivae are normal. Sclera is non-icteric.       Mouth/Throat: Mucous membranes are moist.       Neck: Supple with no signs of meningismus. Cardiovascular: Regular rate and rhythm. No murmurs, gallops, or rubs. 2+ symmetrical distal pulses are present in all extremities. No JVD. Respiratory: Normal respiratory effort. Lungs are clear to auscultation bilaterally. No wheezes,  crackles, or rhonchi.  Gastrointestinal: Soft, ttp over the RUQ, non distended with positive bowel sounds. No rebound or guarding. Genitourinary: No CVA tenderness. Musculoskeletal: Nontender with normal range of motion in all extremities. No edema, cyanosis, or erythema of extremities. Neurologic: Normal speech and language. Face is symmetric. Moving all extremities. No gross focal neurologic deficits are appreciated. Skin: Skin is warm, dry and intact. No rash noted. Psychiatric: Mood and affect are normal. Speech and behavior are normal.  ____________________________________________   LABS (all labs ordered are listed, but only abnormal results are displayed)  Labs Reviewed  BASIC METABOLIC PANEL - Abnormal; Notable for the following:  Result Value   Sodium 131 (*)    Chloride 96 (*)    GFR calc non Af Amer 56 (*)    All other components within normal limits  HEPATIC FUNCTION PANEL - Abnormal; Notable for the following:    Bilirubin, Direct <0.1 (*)    All other components within normal limits  URINALYSIS, COMPLETE (UACMP) WITH MICROSCOPIC - Abnormal; Notable for the following:    Color, Urine YELLOW (*)    APPearance CLEAR (*)    Leukocytes, UA TRACE (*)    Squamous Epithelial / LPF 0-5 (*)    All other components within normal limits  CBC  TROPONIN I  LIPASE, BLOOD  TROPONIN I   ____________________________________________  EKG  ED ECG REPORT I, Rudene Re, the attending physician, personally viewed and interpreted this ECG.  Normal sinus rhythm, rate of 67, normal intervals, left axis deviation, incomplete right bundle branch block, T-wave inversion in V2 and flattening in aVL, no ST elevation or depression. Unchanged from prior from June 2017   17:26 - Sinus bradycardia, rate of 56, normal intervals, normal axis, no ST elevations or depressions, flattening T waves in V3. No significant changes from  prior. ____________________________________________  RADIOLOGY  CXR:1. Bilateral pleural-parenchymal thickening consistent with Scarring. 2. Cardiomegaly. No pulmonary venous congestion.  CTA chest: 1. No evidence acute pulmonary embolism. 2. Bilateral pleuroparenchymal apical thickening is likely benign. 3. No pneumonia or pneumothorax.  ____________________________________________   PROCEDURES  Procedure(s) performed: None Procedures Critical Care performed:  None ____________________________________________   INITIAL IMPRESSION / ASSESSMENT AND PLAN / ED COURSE   76 y.o. female with a history of chronic back pain and GERD who presents for evaluation of constant central sharp chest pain associated with nausea and radiating to the back. Patient is well-appearing, in no distress, has normal vital signs, she does have right upper quadrant tenderness however she is status post cholecystectomy many years ago. We'll check LFTs and lipase to rule out retained stone versus pancreatitis. Patient is an occasional wine drinker but has no history of heavy alcohol abuse. EKG and first troponin with no ischemic changes. I will send patient for CTA to rule out dissection versus PE. We'll treat her pain with IV morphine, Zofran, and fluids. We'll give her full dose of aspirin.   ----------------------------------------- 8:45 PM on 12/17/2016 -----------------------------------------   OBSERVATION CARE: This patient is being placed under observation care for the following reasons: Chest pain with repeat testing to rule out ischemia    Clinical Course as of Dec 17 2044  Mon Dec 17, 2016  1906 Troponin 2, EKG 2, CT of the chest, LFTs and lipase all within normal limits. Patient continues to complain of unchanged pain and nausea. Will give reglan, Pepcid, and GI cocktail in case this is patient's GERD.  [CV]  2043 Patient's pain fully resolved after GI cocktail, Reglan and Pepcid. Patient was  started on protonic for week ago by her PCP. Recommend she continues to take that. Recommend Maalox when necessary for worsening pain. Recommend close follow-up with primary care doctor. Recommend avoiding large meals and alcohol 2 hours before bedtime. Recommend return to the emergency room for new or worsening chest pain, shortness of breath, fever, any other symptoms not present during this visit.  [CV]    Clinical Course User Index [CV] Alfred Levins [place], MD     ----------------------------------------- 8:46 PM on 12/17/2016 -----------------------------------------   END OF OBSERVATION STATUS: After an appropriate period of observation, this patient is being discharged  Pertinent labs & imaging results that were available during my care of the patient were reviewed by me and considered in my medical decision making (see chart for details).    ____________________________________________   FINAL CLINICAL IMPRESSION(S) / ED DIAGNOSES  Final diagnoses:  Chest pain, unspecified type  Gastroesophageal reflux disease, esophagitis presence not specified      NEW MEDICATIONS STARTED DURING THIS VISIT:  New Prescriptions   ALUM & MAG HYDROXIDE-SIMETH (MAALOX MAX) 242-683-41 MG/5ML SUSPENSION    Take 5 mLs by mouth every 6 (six) hours as needed for indigestion.     Note:  This document was prepared using Dragon voice recognition software and may include unintentional dictation errors.    Rudene Re, MD 12/17/16 2046

## 2016-12-17 NOTE — ED Notes (Signed)
Called patient for triage, patient in bathroom

## 2016-12-17 NOTE — ED Triage Notes (Signed)
Pt reports central chest pain that radiates to back and jaw that began today. Pt reports associated SOB, lightheadedness and nausea.

## 2017-02-18 ENCOUNTER — Ambulatory Visit
Admission: RE | Admit: 2017-02-18 | Discharge: 2017-02-18 | Disposition: A | Discharge status: Home / Self Care | Payer: Medicare HMO | Source: Ambulatory Visit | Attending: Physician Assistant | Admitting: Physician Assistant

## 2017-02-18 ENCOUNTER — Other Ambulatory Visit: Payer: Self-pay | Admitting: Physician Assistant

## 2017-02-18 DIAGNOSIS — S0990XA Unspecified injury of head, initial encounter: Secondary | ICD-10-CM

## 2017-02-18 DIAGNOSIS — X58XXXA Exposure to other specified factors, initial encounter: Secondary | ICD-10-CM | POA: Insufficient documentation

## 2017-04-22 ENCOUNTER — Other Ambulatory Visit: Payer: Self-pay | Admitting: Internal Medicine

## 2017-04-22 DIAGNOSIS — R1011 Right upper quadrant pain: Secondary | ICD-10-CM

## 2017-04-23 ENCOUNTER — Other Ambulatory Visit: Payer: Self-pay | Admitting: General Surgery

## 2017-04-23 ENCOUNTER — Ambulatory Visit
Admission: RE | Admit: 2017-04-23 | Discharge: 2017-04-23 | Disposition: A | Discharge status: Home / Self Care | Payer: Medicare HMO | Source: Ambulatory Visit | Attending: Internal Medicine | Admitting: Internal Medicine

## 2017-04-23 DIAGNOSIS — R1011 Right upper quadrant pain: Secondary | ICD-10-CM | POA: Diagnosis present

## 2017-04-23 DIAGNOSIS — R0789 Other chest pain: Secondary | ICD-10-CM | POA: Insufficient documentation

## 2017-04-23 DIAGNOSIS — K8020 Calculus of gallbladder without cholecystitis without obstruction: Secondary | ICD-10-CM | POA: Diagnosis not present

## 2017-04-23 DIAGNOSIS — K8011 Calculus of gallbladder with chronic cholecystitis with obstruction: Secondary | ICD-10-CM

## 2017-04-30 ENCOUNTER — Ambulatory Visit: Admission: RE | Admit: 2017-04-30 | Payer: Medicare HMO | Source: Ambulatory Visit

## 2017-05-07 ENCOUNTER — Ambulatory Visit
Admission: RE | Admit: 2017-05-07 | Discharge: 2017-05-07 | Disposition: A | Discharge status: Home / Self Care | Payer: Medicare HMO | Source: Ambulatory Visit | Attending: General Surgery | Admitting: General Surgery

## 2017-05-07 ENCOUNTER — Other Ambulatory Visit: Payer: Self-pay | Admitting: General Surgery

## 2017-05-07 DIAGNOSIS — K449 Diaphragmatic hernia without obstruction or gangrene: Secondary | ICD-10-CM | POA: Diagnosis not present

## 2017-05-07 DIAGNOSIS — K8011 Calculus of gallbladder with chronic cholecystitis with obstruction: Secondary | ICD-10-CM

## 2017-05-07 DIAGNOSIS — Z9049 Acquired absence of other specified parts of digestive tract: Secondary | ICD-10-CM | POA: Insufficient documentation

## 2017-05-07 DIAGNOSIS — K8020 Calculus of gallbladder without cholecystitis without obstruction: Secondary | ICD-10-CM | POA: Diagnosis not present

## 2017-05-07 DIAGNOSIS — M419 Scoliosis, unspecified: Secondary | ICD-10-CM | POA: Diagnosis not present

## 2017-05-07 MED ORDER — GADOBENATE DIMEGLUMINE 529 MG/ML IV SOLN
20.0000 mL | Freq: Once | INTRAVENOUS | Status: AC | PRN
  Administered 2017-05-07: 18 mL via INTRAVENOUS

## 2017-06-06 ENCOUNTER — Encounter
Admission: RE | Disposition: A | Discharge status: Home / Self Care | Payer: Self-pay | Source: Ambulatory Visit | Attending: Cardiology

## 2017-06-06 ENCOUNTER — Ambulatory Visit
Admission: RE | Admit: 2017-06-06 | Discharge: 2017-06-06 | Disposition: A | Discharge status: Home / Self Care | Payer: Medicare HMO | Source: Ambulatory Visit | Attending: Cardiology | Admitting: Cardiology

## 2017-06-06 DIAGNOSIS — G43909 Migraine, unspecified, not intractable, without status migrainosus: Secondary | ICD-10-CM | POA: Diagnosis not present

## 2017-06-06 DIAGNOSIS — Z7982 Long term (current) use of aspirin: Secondary | ICD-10-CM | POA: Insufficient documentation

## 2017-06-06 DIAGNOSIS — R079 Chest pain, unspecified: Secondary | ICD-10-CM | POA: Insufficient documentation

## 2017-06-06 DIAGNOSIS — R5383 Other fatigue: Secondary | ICD-10-CM | POA: Diagnosis not present

## 2017-06-06 DIAGNOSIS — R0602 Shortness of breath: Secondary | ICD-10-CM | POA: Insufficient documentation

## 2017-06-06 DIAGNOSIS — Z85828 Personal history of other malignant neoplasm of skin: Secondary | ICD-10-CM | POA: Diagnosis not present

## 2017-06-06 DIAGNOSIS — E785 Hyperlipidemia, unspecified: Secondary | ICD-10-CM | POA: Insufficient documentation

## 2017-06-06 DIAGNOSIS — Z8249 Family history of ischemic heart disease and other diseases of the circulatory system: Secondary | ICD-10-CM | POA: Insufficient documentation

## 2017-06-06 DIAGNOSIS — R609 Edema, unspecified: Secondary | ICD-10-CM | POA: Diagnosis not present

## 2017-06-06 DIAGNOSIS — R001 Bradycardia, unspecified: Secondary | ICD-10-CM | POA: Insufficient documentation

## 2017-06-06 DIAGNOSIS — Z9049 Acquired absence of other specified parts of digestive tract: Secondary | ICD-10-CM | POA: Insufficient documentation

## 2017-06-06 DIAGNOSIS — M5136 Other intervertebral disc degeneration, lumbar region: Secondary | ICD-10-CM | POA: Insufficient documentation

## 2017-06-06 DIAGNOSIS — G629 Polyneuropathy, unspecified: Secondary | ICD-10-CM | POA: Diagnosis not present

## 2017-06-06 DIAGNOSIS — R55 Syncope and collapse: Secondary | ICD-10-CM | POA: Insufficient documentation

## 2017-06-06 DIAGNOSIS — Z79899 Other long term (current) drug therapy: Secondary | ICD-10-CM | POA: Diagnosis not present

## 2017-06-06 HISTORY — PX: LEFT HEART CATH AND CORONARY ANGIOGRAPHY: CATH118249

## 2017-06-06 SURGERY — LEFT HEART CATH AND CORONARY ANGIOGRAPHY
Anesthesia: Moderate Sedation | Laterality: Left

## 2017-06-06 MED ORDER — SODIUM CHLORIDE 0.9 % IV SOLN
250.0000 mL | INTRAVENOUS | Status: DC | PRN
Start: 2017-06-06 — End: 2017-06-06

## 2017-06-06 MED ORDER — SODIUM CHLORIDE 0.9% FLUSH
3.0000 mL | INTRAVENOUS | Status: DC | PRN
Start: 2017-06-06 — End: 2017-06-06

## 2017-06-06 MED ORDER — SODIUM CHLORIDE 0.9 % WEIGHT BASED INFUSION: 1.0000 mL/kg/h | INTRAVENOUS | Status: DC

## 2017-06-06 MED ORDER — SODIUM CHLORIDE 0.9% FLUSH: 3.0000 mL | Freq: Two times a day (BID) | INTRAVENOUS | Status: DC

## 2017-06-06 MED ORDER — ASPIRIN 81 MG PO CHEW: 81.0000 mg | CHEWABLE_TABLET | ORAL | Status: DC

## 2017-06-06 MED ORDER — MIDAZOLAM HCL 2 MG/2ML IJ SOLN
INTRAMUSCULAR | Status: AC
  Filled 2017-06-06: qty 2

## 2017-06-06 MED ORDER — SODIUM CHLORIDE 0.9% FLUSH: 3.0000 mL | INTRAVENOUS | Status: DC | PRN

## 2017-06-06 MED ORDER — HEPARIN (PORCINE) IN NACL 2-0.9 UNIT/ML-% IJ SOLN
INTRAMUSCULAR | Status: AC
  Filled 2017-06-06: qty 500

## 2017-06-06 MED ORDER — SODIUM CHLORIDE 0.9 % WEIGHT BASED INFUSION
3.0000 mL/kg/h | INTRAVENOUS | Status: AC
  Administered 2017-06-06: 3 mL/kg/h via INTRAVENOUS

## 2017-06-06 MED ORDER — MIDAZOLAM HCL 2 MG/2ML IJ SOLN
INTRAMUSCULAR | Status: DC | PRN
  Administered 2017-06-06: 1 mg via INTRAVENOUS

## 2017-06-06 MED ORDER — FENTANYL CITRATE (PF) 100 MCG/2ML IJ SOLN
INTRAMUSCULAR | Status: AC
  Filled 2017-06-06: qty 2

## 2017-06-06 MED ORDER — FENTANYL CITRATE (PF) 100 MCG/2ML IJ SOLN
INTRAMUSCULAR | Status: DC | PRN
  Administered 2017-06-06: 50 ug via INTRAVENOUS

## 2017-06-06 MED ORDER — ACETAMINOPHEN 325 MG PO TABS: 650.0000 mg | ORAL_TABLET | ORAL | Status: DC | PRN

## 2017-06-06 MED ORDER — ONDANSETRON HCL 4 MG/2ML IJ SOLN: 4.0000 mg | Freq: Four times a day (QID) | INTRAMUSCULAR | Status: DC | PRN

## 2017-06-06 SURGICAL SUPPLY — 9 items
CATH INFINITI 5FR ANG PIGTAIL (CATHETERS) ×2 IMPLANT
CATH INFINITI 5FR JL4 (CATHETERS) ×2 IMPLANT
CATH INFINITI JR4 5F (CATHETERS) ×2 IMPLANT
DEVICE CLOSURE MYNXGRIP 5F (Vascular Products) ×2 IMPLANT
KIT MANI 3VAL PERCEP (MISCELLANEOUS) ×2 IMPLANT
NEEDLE PERC 18GX7CM (NEEDLE) ×2 IMPLANT
PACK CARDIAC CATH (CUSTOM PROCEDURE TRAY) ×2 IMPLANT
SHEATH AVANTI 5FR X 11CM (SHEATH) ×2 IMPLANT
WIRE EMERALD 3MM-J .035X150CM (WIRE) ×2 IMPLANT

## 2017-06-06 NOTE — Discharge Instructions (Signed)

## 2017-06-07 ENCOUNTER — Encounter: Payer: Self-pay | Admitting: Cardiology

## 2017-08-30 ENCOUNTER — Encounter: Payer: Self-pay | Admitting: *Deleted

## 2017-09-02 ENCOUNTER — Encounter
Admission: RE | Disposition: A | Discharge status: Home / Self Care | Payer: Self-pay | Source: Ambulatory Visit | Attending: Unknown Physician Specialty

## 2017-09-02 ENCOUNTER — Ambulatory Visit
Admission: RE | Admit: 2017-09-02 | Discharge: 2017-09-02 | Disposition: A | Discharge status: Home / Self Care | Payer: Medicare HMO | Source: Ambulatory Visit | Attending: Unknown Physician Specialty | Admitting: Unknown Physician Specialty

## 2017-09-02 ENCOUNTER — Ambulatory Visit: Payer: Medicare HMO | Admitting: Certified Registered"

## 2017-09-02 DIAGNOSIS — I6523 Occlusion and stenosis of bilateral carotid arteries: Secondary | ICD-10-CM | POA: Insufficient documentation

## 2017-09-02 DIAGNOSIS — K219 Gastro-esophageal reflux disease without esophagitis: Secondary | ICD-10-CM | POA: Diagnosis not present

## 2017-09-02 DIAGNOSIS — Z9049 Acquired absence of other specified parts of digestive tract: Secondary | ICD-10-CM | POA: Diagnosis not present

## 2017-09-02 DIAGNOSIS — D649 Anemia, unspecified: Secondary | ICD-10-CM | POA: Diagnosis not present

## 2017-09-02 DIAGNOSIS — G709 Myoneural disorder, unspecified: Secondary | ICD-10-CM | POA: Insufficient documentation

## 2017-09-02 DIAGNOSIS — Z90710 Acquired absence of both cervix and uterus: Secondary | ICD-10-CM | POA: Insufficient documentation

## 2017-09-02 DIAGNOSIS — K640 First degree hemorrhoids: Secondary | ICD-10-CM | POA: Insufficient documentation

## 2017-09-02 DIAGNOSIS — K317 Polyp of stomach and duodenum: Secondary | ICD-10-CM | POA: Diagnosis not present

## 2017-09-02 DIAGNOSIS — M1990 Unspecified osteoarthritis, unspecified site: Secondary | ICD-10-CM | POA: Diagnosis not present

## 2017-09-02 DIAGNOSIS — Z809 Family history of malignant neoplasm, unspecified: Secondary | ICD-10-CM | POA: Diagnosis not present

## 2017-09-02 DIAGNOSIS — M5136 Other intervertebral disc degeneration, lumbar region: Secondary | ICD-10-CM | POA: Diagnosis not present

## 2017-09-02 DIAGNOSIS — Z86010 Personal history of colonic polyps: Secondary | ICD-10-CM | POA: Diagnosis not present

## 2017-09-02 DIAGNOSIS — Z79899 Other long term (current) drug therapy: Secondary | ICD-10-CM | POA: Diagnosis not present

## 2017-09-02 DIAGNOSIS — I739 Peripheral vascular disease, unspecified: Secondary | ICD-10-CM | POA: Insufficient documentation

## 2017-09-02 DIAGNOSIS — D127 Benign neoplasm of rectosigmoid junction: Secondary | ICD-10-CM | POA: Diagnosis not present

## 2017-09-02 DIAGNOSIS — Z833 Family history of diabetes mellitus: Secondary | ICD-10-CM | POA: Diagnosis not present

## 2017-09-02 DIAGNOSIS — K449 Diaphragmatic hernia without obstruction or gangrene: Secondary | ICD-10-CM | POA: Diagnosis not present

## 2017-09-02 DIAGNOSIS — G629 Polyneuropathy, unspecified: Secondary | ICD-10-CM | POA: Insufficient documentation

## 2017-09-02 DIAGNOSIS — Z8249 Family history of ischemic heart disease and other diseases of the circulatory system: Secondary | ICD-10-CM | POA: Insufficient documentation

## 2017-09-02 DIAGNOSIS — G4700 Insomnia, unspecified: Secondary | ICD-10-CM | POA: Diagnosis not present

## 2017-09-02 DIAGNOSIS — Z87891 Personal history of nicotine dependence: Secondary | ICD-10-CM | POA: Insufficient documentation

## 2017-09-02 DIAGNOSIS — Z85828 Personal history of other malignant neoplasm of skin: Secondary | ICD-10-CM | POA: Insufficient documentation

## 2017-09-02 DIAGNOSIS — Z1211 Encounter for screening for malignant neoplasm of colon: Secondary | ICD-10-CM | POA: Insufficient documentation

## 2017-09-02 DIAGNOSIS — K222 Esophageal obstruction: Secondary | ICD-10-CM | POA: Insufficient documentation

## 2017-09-02 HISTORY — PX: ESOPHAGOGASTRODUODENOSCOPY: SHX5428

## 2017-09-02 HISTORY — PX: COLONOSCOPY WITH PROPOFOL: SHX5780

## 2017-09-02 SURGERY — COLONOSCOPY WITH PROPOFOL
Anesthesia: General

## 2017-09-02 MED ORDER — PROPOFOL 500 MG/50ML IV EMUL
INTRAVENOUS | Status: DC | PRN
  Administered 2017-09-02: 130 ug/kg/min via INTRAVENOUS

## 2017-09-02 MED ORDER — LIDOCAINE HCL (CARDIAC) 20 MG/ML IV SOLN
INTRAVENOUS | Status: DC | PRN
  Administered 2017-09-02: 100 mg via INTRAVENOUS

## 2017-09-02 MED ORDER — LIDOCAINE HCL (PF) 2 % IJ SOLN
INTRAMUSCULAR | Status: AC
  Filled 2017-09-02: qty 10

## 2017-09-02 MED ORDER — PROPOFOL 10 MG/ML IV BOLUS
INTRAVENOUS | Status: DC | PRN
  Administered 2017-09-02: 70 mg via INTRAVENOUS
  Administered 2017-09-02: 50 mg via INTRAVENOUS
  Administered 2017-09-02: 30 mg via INTRAVENOUS

## 2017-09-02 MED ORDER — SODIUM CHLORIDE 0.9 % IV SOLN: INTRAVENOUS | Status: DC

## 2017-09-02 MED ORDER — SODIUM CHLORIDE 0.9 % IV SOLN
INTRAVENOUS | Status: DC
  Administered 2017-09-02: 11:00:00 via INTRAVENOUS

## 2017-09-02 NOTE — Anesthesia Postprocedure Evaluation (Signed)
Anesthesia Post Note  Patient: Heidi Cross  Procedure(s) Performed: COLONOSCOPY WITH PROPOFOL (N/A ) ESOPHAGOGASTRODUODENOSCOPY (EGD) (N/A )  Patient location during evaluation: Endoscopy Anesthesia Type: General Level of consciousness: awake and alert Pain management: pain level controlled Vital Signs Assessment: post-procedure vital signs reviewed and stable Respiratory status: spontaneous breathing, nonlabored ventilation, respiratory function stable and patient connected to nasal cannula oxygen Cardiovascular status: blood pressure returned to baseline and stable Postop Assessment: no apparent nausea or vomiting Anesthetic complications: no     Last Vitals:  Vitals:   09/02/17 1140 09/02/17 1150  BP: 138/60 (!) 139/56  Pulse: (!) 54 (!) 54  Resp: (!) 22 (!) 21  Temp:    SpO2: 98% (!) 79%    Last Pain:  Vitals:   09/02/17 1150  TempSrc:   PainSc: 0-No pain                 [name] S

## 2017-09-02 NOTE — Transfer of Care (Signed)
Immediate Anesthesia Transfer of Care Note  Patient: Heidi Cross  Procedure(s) Performed: COLONOSCOPY WITH PROPOFOL (N/A ) ESOPHAGOGASTRODUODENOSCOPY (EGD) (N/A )  Patient Location: PACU  Anesthesia Type:General  Level of Consciousness: awake and responds to stimulation  Airway & Oxygen Therapy: Patient Spontanous Breathing and Patient connected to nasal cannula oxygen  Post-op Assessment: Report given to RN and Post -op Vital signs reviewed and stable  Post vital signs: Reviewed and stable  Last Vitals:  Vitals Value Taken Time  BP 125/52 09/02/2017 11:22 AM  Temp 36.1 C 09/02/2017 11:20 AM  Pulse 63 09/02/2017 11:22 AM  Resp 26 09/02/2017 11:22 AM  SpO2 99 % 09/02/2017 11:22 AM    Last Pain:  Vitals:   09/02/17 1025  TempSrc: Tympanic  PainSc: 0-No pain         Complications: No apparent anesthesia complications

## 2017-09-02 NOTE — H&P (Signed)
Primary Care Physician:  Rusty Aus, MD Primary Gastroenterologist:  Dr. Vira Agar  Pre-Procedure History & Physical: HPI:  Heidi Cross is a 77 y.o. female is here for an endoscopy and colonoscopy.  Indications are PH colon polyps and epigastric pain and dysphagia.   Past Medical History:  Diagnosis Date  . Anemia    " when I was young and when I was pregnant "  . Cancer (HCC)    Basal Cell Carcinoma  . DDD (degenerative disc disease), lumbar   . Environmental allergies   . GERD (gastroesophageal reflux disease)   . Insomnia   . Mild atherosclerosis of both carotid arteries   . Peripheral neuropathy   . PONV (postoperative nausea and vomiting)   . Shortness of breath dyspnea    with exertion  . Spinal cord tumor    lumbar  . Syncope   . Wears contact lenses     Past Surgical History:  Procedure Laterality Date  . ABDOMINAL HYSTERECTOMY    . CHOLECYSTECTOMY    . COLONOSCOPY W/ BIOPSIES AND POLYPECTOMY    . DILATION AND CURETTAGE OF UTERUS    . LAMINECTOMY N/A 11/03/2015   Procedure: L1-2 Laminectomy for resection of intradural tumor;  Surgeon: Newman Pies, MD;  Location: [place] [facility];  Service: Neurosurgery;  Laterality: N/A;  L1-2 Laminectomy for resection of intradural tumor  . LEFT HEART CATH AND CORONARY ANGIOGRAPHY Left 06/06/2017   Procedure: LEFT HEART CATH AND CORONARY ANGIOGRAPHY;  Surgeon: Isaias Cowman, MD;  Location: [facility];  Service: Cardiovascular;  Laterality: Left;  . TONSILLECTOMY      Prior to Admission medications   Medication Sig Start Date End Date Taking? Authorizing Provider  fexofenadine (ALLEGRA) 180 MG tablet Take 180 mg by mouth daily as needed for allergies.    Yes [provider]  fluticasone (FLONASE) 50 MCG/ACT nasal spray Place 1 spray into both nostrils daily as needed for allergies or rhinitis.   Yes [provider]  oxybutynin (DITROPAN) 5 MG tablet Take 5 mg by mouth at bedtime.   Yes  [provider]  pantoprazole (PROTONIX) 40 MG tablet Take 40 mg by mouth daily.   Yes [provider]  traMADol (ULTRAM) 50 MG tablet Take 50 mg by mouth 2 (two) times daily as needed for moderate pain.   Yes [provider]  traZODone (DESYREL) 50 MG tablet Take 50 mg by mouth at bedtime.   Yes [provider]  aspirin EC 81 MG tablet Take 81 mg by mouth daily.    [provider]  Calcium Carbonate-Vitamin D (CALCIUM 500 + D) 500-125 MG-UNIT TABS Take 1 tablet by mouth every other day.    [provider]  Glucosamine-Chondroitin 500-400 MG CAPS Take 1 tablet by mouth daily.  04/15/08   [provider]  Multiple Vitamins-Minerals (QC WOMENS DAILY MULTIVITAMIN) TABS Take 1 tablet by mouth daily.  04/15/08   [provider]  Potassium 99 MG TABS Take 1 tablet by mouth daily.     [provider]  ranitidine (ZANTAC) 150 MG tablet Take 150 mg by mouth 2 (two) times daily.    [provider]  sucralfate (CARAFATE) 1 g tablet Take 1 g by mouth 4 (four) times daily -  with meals and at bedtime.    [provider]    Allergies as of 08/30/2017  . (No Known Allergies)    Family History  Problem Relation Age of Onset  .  Diabetes Mother   . Cancer Mother   . Hypertension Mother   . Cancer Father     Social History   Socioeconomic History  . Marital status: Married    Spouse name: Not on file  . Number of children: Not on file  . Years of education: Not on file  . Highest education level: Not on file  Occupational History  . Not on file  Social Needs  . Financial resource strain: Not on file  . Food insecurity:    Worry: Not on file    Inability: Not on file  . Transportation needs:    Medical: Not on file    Non-medical: Not on file  Tobacco Use  . Smoking status: Former Research scientist (life sciences)  . Smokeless tobacco: Never Used  Substance and Sexual Activity  . Alcohol use: Yes    Comment:  occasional wine noe in 1 week  . Drug use: No  . Sexual activity: Not on file  Lifestyle  . Physical activity:    Days per week: Not on file    Minutes per session: Not on file  . Stress: Not on file  Relationships  . Social connections:    Talks on phone: Not on file    Gets together: Not on file    Attends religious service: Not on file    Active member of club or organization: Not on file    Attends meetings of clubs or organizations: Not on file    Relationship status: Not on file  . Intimate partner violence:    Fear of current or ex partner: Not on file    Emotionally abused: Not on file    Physically abused: Not on file    Forced sexual activity: Not on file  Other Topics Concern  . Not on file  Social History Narrative  . Not on file    Review of Systems: See HPI, otherwise negative ROS  Physical Exam: BP 129/70   Pulse 70   Temp 98 F (36.7 C) (Tympanic)   Resp 20   Ht 5\' 9"  (1.753 m)   Wt 90.7 kg (200 lb)   SpO2 98%   BMI 29.53 kg/m  General:   Alert,  pleasant and cooperative in NAD Head:  Normocephalic and atraumatic. Neck:  Supple; no masses or thyromegaly. Lungs:  Clear throughout to auscultation.    Heart:  Regular rate and rhythm. Abdomen:  Soft, nontender and nondistended. Normal bowel sounds, without guarding, and without rebound.   Neurologic:  Alert and  oriented x4;  grossly normal neurologically.  Impression/Plan: Heidi Cross is here for an endoscopy and colonoscopy to be performed for Personal history of colon polyps and epigastric pain and dysphagia.  Risks, benefits, limitations, and alternatives regarding  endoscopy and colonoscopy have been reviewed with the patient.  Questions have been answered.  All parties agreeable.   Gaylyn Cheers, MD  09/02/2017, 10:31 AM

## 2017-09-02 NOTE — Anesthesia Post-op Follow-up Note (Signed)
Anesthesia QCDR form completed.        

## 2017-09-02 NOTE — Anesthesia Preprocedure Evaluation (Signed)
Anesthesia Evaluation  Patient identified by MRN, date of birth, ID band Patient awake    Reviewed: Allergy & Precautions, NPO status , Patient's Chart, lab work & pertinent test results, reviewed documented beta blocker date and time   History of Anesthesia Complications (+) PONV and history of anesthetic complications  Airway Mallampati: III  TM Distance: >3 FB     Dental  (+) Chipped   Pulmonary shortness of breath,           Cardiovascular + Peripheral Vascular Disease       Neuro/Psych  Neuromuscular disease    GI/Hepatic GERD  ,  Endo/Other    Renal/GU      Musculoskeletal  (+) Arthritis ,   Abdominal   Peds  Hematology  (+) anemia ,   Anesthesia Other Findings   Reproductive/Obstetrics                             Anesthesia Physical Anesthesia Plan  ASA: III  Anesthesia Plan: General   Post-op Pain Management:    Induction: Intravenous  PONV Risk Score and Plan:   Airway Management Planned:   Additional Equipment:   Intra-op Plan:   Post-operative Plan:   Informed Consent: I have reviewed the patients History and Physical, chart, labs and discussed the procedure including the risks, benefits and alternatives for the proposed anesthesia with the patient or authorized representative who has indicated his/her understanding and acceptance.     Plan Discussed with: CRNA  Anesthesia Plan Comments:         Anesthesia Quick Evaluation

## 2017-09-02 NOTE — Op Note (Signed)
[facility]astroenterology Patient Name: Heidi Cross Procedure Date: 09/02/2017 10:12 AM MRN: [id number] Account #: [id number] Date of Birth: 1941/03/02 Admit Type: Outpatient Age: 77 Room: [facility] ENDO ROOM 1 Gender: Female Note Status: Finalized Procedure:            Colonoscopy Indications:          High risk colon cancer surveillance: Personal history                        of colonic polyps Providers:            Manya Silvas, MD Referring MD:         Rusty Aus, MD (Referring MD) Medicines:            Propofol per Anesthesia Complications:        No immediate complications. Procedure:            Pre-Anesthesia Assessment:                       - After reviewing the risks and benefits, the patient                        was deemed in satisfactory condition to undergo the                        procedure.                       After obtaining informed consent, the colonoscope was                        passed under direct vision. Throughout the procedure,                        the patient's blood pressure, pulse, and oxygen                        saturations were monitored continuously. The                        Colonoscope was introduced through the anus and                        advanced to the the cecum, identified by appendiceal                        orifice and ileocecal valve. The colonoscopy was                        performed without difficulty. The patient tolerated the                        procedure well. The quality of the bowel preparation                        was adequate to identify polyps. Findings:      Four sessile polyps were found in the recto-sigmoid colon. The polyps       were diminutive in size. These polyps were removed with a jumbo cold       forceps. Resection and retrieval were complete.  Internal hemorrhoids were found during endoscopy. The hemorrhoids were       small and Grade I (internal hemorrhoids that do  not prolapse).      The exam was otherwise without abnormality. Impression:           - Four diminutive polyps at the recto-sigmoid colon,                        removed with a jumbo cold forceps. Resected and                        retrieved.                       - Internal hemorrhoids.                       - The examination was otherwise normal. Recommendation:       - Await pathology results. Manya Silvas, MD 09/02/2017 11:21:18 AM This report has been signed electronically. Number of Addenda: 0 Note Initiated On: 09/02/2017 10:12 AM Scope Withdrawal Time: 0 hours 12 minutes 8 seconds  Total Procedure Duration: 0 hours 21 minutes 26 seconds       [facility]

## 2017-09-02 NOTE — Op Note (Signed)
[facility] Gastroenterology Patient Name: Heidi Cross Procedure Date: 09/02/2017 10:13 AM MRN: [id number] Account #: [id number] Date of Birth: 03-Sep-1940 Admit Type: Outpatient Age: 77 Room: [facility] ENDO ROOM 1 Gender: Female Note Status: Finalized Procedure:            Upper GI endoscopy Indications:          Heartburn, Suspected gastro-esophageal reflux disease Providers:            Manya Silvas, MD Referring MD:         Rusty Aus, MD (Referring MD) Medicines:            Propofol per Anesthesia Complications:        No immediate complications. Procedure:            Pre-Anesthesia Assessment:                       - After reviewing the risks and benefits, the patient                        was deemed in satisfactory condition to undergo the                        procedure.                       After obtaining informed consent, the endoscope was                        passed under direct vision. Throughout the procedure,                        the patient's blood pressure, pulse, and oxygen                        saturations were monitored continuously. The Endoscope                        was introduced through the mouth, and advanced to the                        second part of duodenum. The upper GI endoscopy was                        accomplished without difficulty. The patient tolerated                        the procedure well. Findings:      A mild Schatzki ring was found at the gastroesophageal junction. At the       end of the procedure A guidewire was placed and the scope was withdrawn.       Dilation was performed with a Savary dilator with no resistance at 16 mm.      A medium-sized hiatal hernia was present.      Multiple medium pedunculated and sessile polyps with no bleeding and no       stigmata of recent bleeding were found in the gastric body. Harmless       fundic type polyps.      Diffuse mildly erythematous mucosa without  bleeding was found in the       gastric antrum. Biopsies were taken with  a cold forceps for histology.       Biopsies were taken with a cold forceps for Helicobacter pylori testing.      The examined duodenum was normal. Impression:           - Mild Schatzki ring. Dilated.                       - Medium-sized hiatal hernia.                       - Multiple gastric polyps.                       - Erythematous mucosa in the antrum. Biopsied.                       - Normal examined duodenum. Recommendation:       - Await pathology results. Manya Silvas, MD 09/02/2017 10:52:38 AM This report has been signed electronically. Number of Addenda: 0 Note Initiated On: 09/02/2017 10:13 AM      [facility]

## 2017-09-04 ENCOUNTER — Encounter: Payer: Self-pay | Admitting: Unknown Physician Specialty

## 2017-09-05 LAB — SURGICAL PATHOLOGY

## 2017-10-02 ENCOUNTER — Other Ambulatory Visit: Payer: Self-pay | Admitting: Internal Medicine

## 2017-10-02 DIAGNOSIS — Z1231 Encounter for screening mammogram for malignant neoplasm of breast: Secondary | ICD-10-CM

## 2017-10-22 ENCOUNTER — Ambulatory Visit
Admission: RE | Admit: 2017-10-22 | Discharge: 2017-10-22 | Disposition: A | Discharge status: Home / Self Care | Payer: Medicare HMO | Source: Ambulatory Visit | Attending: Internal Medicine | Admitting: Internal Medicine

## 2017-10-22 DIAGNOSIS — Z1231 Encounter for screening mammogram for malignant neoplasm of breast: Secondary | ICD-10-CM | POA: Insufficient documentation

## 2018-06-18 DIAGNOSIS — Z79899 Other long term (current) drug therapy: Secondary | ICD-10-CM | POA: Diagnosis not present

## 2018-06-18 DIAGNOSIS — E538 Deficiency of other specified B group vitamins: Secondary | ICD-10-CM | POA: Diagnosis not present

## 2018-06-18 DIAGNOSIS — E782 Mixed hyperlipidemia: Secondary | ICD-10-CM | POA: Diagnosis not present

## 2018-06-23 DIAGNOSIS — Z0000 Encounter for general adult medical examination without abnormal findings: Secondary | ICD-10-CM | POA: Diagnosis not present

## 2018-06-23 DIAGNOSIS — F330 Major depressive disorder, recurrent, mild: Secondary | ICD-10-CM | POA: Diagnosis not present

## 2018-06-23 DIAGNOSIS — G63 Polyneuropathy in diseases classified elsewhere: Secondary | ICD-10-CM | POA: Diagnosis not present

## 2018-06-23 DIAGNOSIS — M1711 Unilateral primary osteoarthritis, right knee: Secondary | ICD-10-CM | POA: Diagnosis not present

## 2018-06-23 DIAGNOSIS — M76899 Other specified enthesopathies of unspecified lower limb, excluding foot: Secondary | ICD-10-CM | POA: Diagnosis not present

## 2018-06-23 DIAGNOSIS — M5136 Other intervertebral disc degeneration, lumbar region: Secondary | ICD-10-CM | POA: Diagnosis not present

## 2018-06-23 DIAGNOSIS — M5010 Cervical disc disorder with radiculopathy, unspecified cervical region: Secondary | ICD-10-CM | POA: Diagnosis not present

## 2018-06-23 DIAGNOSIS — E782 Mixed hyperlipidemia: Secondary | ICD-10-CM | POA: Diagnosis not present

## 2018-07-14 DIAGNOSIS — R300 Dysuria: Secondary | ICD-10-CM | POA: Diagnosis not present

## 2018-07-14 DIAGNOSIS — R350 Frequency of micturition: Secondary | ICD-10-CM | POA: Diagnosis not present

## 2018-07-14 DIAGNOSIS — R6883 Chills (without fever): Secondary | ICD-10-CM | POA: Diagnosis not present

## 2018-07-21 DIAGNOSIS — M48062 Spinal stenosis, lumbar region with neurogenic claudication: Secondary | ICD-10-CM | POA: Diagnosis not present

## 2018-07-21 DIAGNOSIS — M5416 Radiculopathy, lumbar region: Secondary | ICD-10-CM | POA: Diagnosis not present

## 2018-07-21 DIAGNOSIS — M5136 Other intervertebral disc degeneration, lumbar region: Secondary | ICD-10-CM | POA: Diagnosis not present

## 2018-07-27 ENCOUNTER — Other Ambulatory Visit: Payer: Self-pay

## 2018-07-27 ENCOUNTER — Emergency Department
Admission: EM | Admit: 2018-07-27 | Discharge: 2018-07-27 | Disposition: A | Discharge status: Home / Self Care | Payer: Medicare HMO | Attending: Emergency Medicine | Admitting: Emergency Medicine

## 2018-07-27 ENCOUNTER — Encounter: Payer: Self-pay | Admitting: Nurse Practitioner

## 2018-07-27 DIAGNOSIS — Z79899 Other long term (current) drug therapy: Secondary | ICD-10-CM | POA: Diagnosis not present

## 2018-07-27 DIAGNOSIS — R0602 Shortness of breath: Secondary | ICD-10-CM | POA: Diagnosis not present

## 2018-07-27 DIAGNOSIS — Z87891 Personal history of nicotine dependence: Secondary | ICD-10-CM | POA: Insufficient documentation

## 2018-07-27 DIAGNOSIS — R58 Hemorrhage, not elsewhere classified: Secondary | ICD-10-CM | POA: Diagnosis not present

## 2018-07-27 DIAGNOSIS — Z7982 Long term (current) use of aspirin: Secondary | ICD-10-CM | POA: Diagnosis not present

## 2018-07-27 DIAGNOSIS — I2510 Atherosclerotic heart disease of native coronary artery without angina pectoris: Secondary | ICD-10-CM | POA: Diagnosis not present

## 2018-07-27 DIAGNOSIS — I959 Hypotension, unspecified: Secondary | ICD-10-CM | POA: Diagnosis not present

## 2018-07-27 DIAGNOSIS — R0902 Hypoxemia: Secondary | ICD-10-CM | POA: Diagnosis not present

## 2018-07-27 DIAGNOSIS — Z9049 Acquired absence of other specified parts of digestive tract: Secondary | ICD-10-CM | POA: Diagnosis not present

## 2018-07-27 DIAGNOSIS — R52 Pain, unspecified: Secondary | ICD-10-CM | POA: Diagnosis not present

## 2018-07-27 DIAGNOSIS — Z85828 Personal history of other malignant neoplasm of skin: Secondary | ICD-10-CM | POA: Insufficient documentation

## 2018-07-27 DIAGNOSIS — R55 Syncope and collapse: Secondary | ICD-10-CM | POA: Diagnosis not present

## 2018-07-27 DIAGNOSIS — I4510 Unspecified right bundle-branch block: Secondary | ICD-10-CM | POA: Diagnosis not present

## 2018-07-27 LAB — BASIC METABOLIC PANEL
Anion gap: 9 (ref 5–15)
BUN: 15 mg/dL (ref 8–23)
CHLORIDE: 102 mmol/L (ref 98–111)
CO2: 22 mmol/L (ref 22–32)
CREATININE: 1.2 mg/dL — AB (ref 0.44–1.00)
Calcium: 7.9 mg/dL — ABNORMAL LOW (ref 8.9–10.3)
GFR calc non Af Amer: 44 mL/min — ABNORMAL LOW (ref >60)
GFR, EST AFRICAN AMERICAN: 51 mL/min — AB (ref >60)
GLUCOSE: 89 mg/dL (ref 70–99)
Potassium: 4.5 mmol/L (ref 3.5–5.1)
Sodium: 133 mmol/L — ABNORMAL LOW (ref 135–145)

## 2018-07-27 LAB — URINALYSIS, COMPLETE (UACMP) WITH MICROSCOPIC
BILIRUBIN URINE: NEGATIVE
Bacteria, UA: NONE SEEN
GLUCOSE, UA: NEGATIVE mg/dL
Hgb urine dipstick: NEGATIVE
KETONES UR: NEGATIVE mg/dL
NITRITE: NEGATIVE
PH: 5 (ref 5.0–8.0)
Protein, ur: NEGATIVE mg/dL
Specific Gravity, Urine: 1.01 (ref 1.005–1.030)

## 2018-07-27 LAB — CBC WITH DIFFERENTIAL/PLATELET
ABS IMMATURE GRANULOCYTES: 0.03 10*3/uL (ref 0.00–0.07)
BASOS ABS: 0 10*3/uL (ref 0.0–0.1)
Basophils Relative: 1 %
Eosinophils Absolute: 0.3 10*3/uL (ref 0.0–0.5)
Eosinophils Relative: 5 %
HEMATOCRIT: 33.2 % — AB (ref 36.0–46.0)
HEMOGLOBIN: 10.9 g/dL — AB (ref 12.0–15.0)
Immature Granulocytes: 1 %
LYMPHS ABS: 1.5 10*3/uL (ref 0.7–4.0)
LYMPHS PCT: 25 %
MCH: 31.2 pg (ref 26.0–34.0)
MCHC: 32.8 g/dL (ref 30.0–36.0)
MCV: 95.1 fL (ref 80.0–100.0)
Monocytes Absolute: 0.7 10*3/uL (ref 0.1–1.0)
Monocytes Relative: 11 %
NEUTROS ABS: 3.4 10*3/uL (ref 1.7–7.7)
NRBC: 0 % (ref 0.0–0.2)
Neutrophils Relative %: 57 %
Platelets: 229 10*3/uL (ref 150–400)
RBC: 3.49 MIL/uL — AB (ref 3.87–5.11)
RDW: 13.2 % (ref 11.5–15.5)
WBC: 5.9 10*3/uL (ref 4.0–10.5)

## 2018-07-27 LAB — TROPONIN I: Troponin I: <0.03 ng/mL (ref <0.03)

## 2018-07-27 MED ORDER — SODIUM CHLORIDE 0.9 % IV BOLUS
1000.0000 mL | Freq: Once | INTRAVENOUS | Status: AC
  Administered 2018-07-27: 1000 mL via INTRAVENOUS

## 2018-07-27 NOTE — ED Notes (Signed)
NAD noted at time of D/C. Pt taken to lobby via wheelchair at this time. Pt denies comments/concerns regarding D/C.

## 2018-07-27 NOTE — Discharge Instructions (Addendum)
Return to the ER for new, worsening, or persistent weakness or lightheadedness, feeling like you are going to pass out, vomiting, fever, or any other new or worsening symptoms that concern you.

## 2018-07-27 NOTE — ED Triage Notes (Signed)
Pt 78 year old female brought via EMS.  [place] in [place] [employer]  this [place] sitting on stool. Per EMS bystanders said she fall gradually to a sitting position while trying to stand up. Per EMS Pt really weak and pale as told by the bystanders. EMS reports +Orthostatic vs. Pt A&Ox4 upon arrival.

## 2018-07-27 NOTE — ED Provider Notes (Signed)
[facility] Emergency Department Provider Note ____________________________________________   First MD Initiated Contact with Patient 07/27/18 1131     (approximate)  I have reviewed the triage vital signs and the nursing notes.   HISTORY  Chief Complaint Near Syncope    HPI Heidi Cross is a 78 y.o. 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  The patient states that she had been feeling lightheaded and "off" for most of the morning and then became more lightheaded and felt like she was about to pass out.  She states that she fell into a sitting position and per bystanders appeared weak and pale.  She states she now feels somewhat better.  She states she was feeling well yesterday.  Past Medical History:  Diagnosis Date  . Anemia    " when I was young and when I was pregnant "  . Cancer (HCC)    Basal Cell Carcinoma  . DDD (degenerative disc disease), lumbar   . Environmental allergies   . GERD (gastroesophageal reflux disease)   . Insomnia   . Mild atherosclerosis of both carotid arteries   . Peripheral neuropathy   . PONV (postoperative nausea and vomiting)   . Shortness of breath dyspnea    with exertion  . Spinal cord tumor    lumbar  . Syncope   . Wears contact lenses     Patient Active Problem List   Diagnosis Date Noted  . Lumbar spine tumor 11/03/2015    Past Surgical History:  Procedure Laterality Date  . ABDOMINAL HYSTERECTOMY    . CHOLECYSTECTOMY    . COLONOSCOPY W/ BIOPSIES AND POLYPECTOMY    . COLONOSCOPY WITH PROPOFOL N/A 09/02/2017   Procedure: COLONOSCOPY WITH PROPOFOL;  Surgeon: Manya Silvas, MD;  Location: [facility] ENDOSCOPY;  Service: Endoscopy;  Laterality: N/A;  . DILATION AND CURETTAGE OF UTERUS    . ESOPHAGOGASTRODUODENOSCOPY N/A 09/02/2017   Procedure: ESOPHAGOGASTRODUODENOSCOPY  (EGD);  Surgeon: Manya Silvas, MD;  Location: [facility] ENDOSCOPY;  Service: Endoscopy;  Laterality: N/A;  . LAMINECTOMY N/A 11/03/2015   Procedure: L1-2 Laminectomy for resection of intradural tumor;  Surgeon: Newman Pies, MD;  Location: [place] NEURO ORS;  Service: Neurosurgery;  Laterality: N/A;  L1-2 Laminectomy for resection of intradural tumor  . LEFT HEART CATH AND CORONARY ANGIOGRAPHY Left 06/06/2017   Procedure: LEFT HEART CATH AND CORONARY ANGIOGRAPHY;  Surgeon: Isaias Cowman, MD;  Location: [place] CV LAB;  Service: Cardiovascular;  Laterality: Left;  . TONSILLECTOMY      Prior to Admission medications   Medication Sig Start Date End Date Taking? Authorizing Provider  aspirin EC 81 MG tablet Take 81 mg by mouth daily.   Yes [provider]  buPROPion (WELLBUTRIN XL) 150 MG 24 hr tablet Take 150 mg by mouth daily. 06/23/18 06/23/19 Yes [provider]  Calcium Carbonate-Vitamin D (CALCIUM 500 + D) 500-125 MG-UNIT TABS Take 1 tablet by mouth every other day.   Yes [provider]  cyclobenzaprine (FLEXERIL) 10 MG tablet Take 10 mg by mouth 2 (two) times daily. 07/07/18  Yes [provider]  fexofenadine (ALLEGRA) 180 MG tablet Take 180 mg by mouth daily as needed for allergies.    Yes [provider]  gabapentin (NEURONTIN) 300 MG capsule Take 300-600 mg by mouth at bedtime. 05/27/18  Yes [provider]  Glucosamine-Chondroitin 500-400  MG CAPS Take 1 tablet by mouth daily.  04/15/08  Yes [provider]  Multiple Vitamins-Minerals (QC WOMENS DAILY MULTIVITAMIN) TABS Take 1 tablet by mouth daily.  04/15/08  Yes [provider]  oxybutynin (DITROPAN) 5 MG tablet Take 5 mg by mouth at bedtime.   Yes [provider]  pantoprazole (PROTONIX) 40 MG tablet Take 40 mg by mouth daily.   Yes [provider]  sucralfate (CARAFATE) 1 g tablet Take 1 g by mouth 4 (four) times daily -  with meals and at  bedtime.   Yes [provider]  traMADol (ULTRAM) 50 MG tablet Take 50 mg by mouth 2 (two) times daily as needed for moderate pain.   Yes [provider]  traZODone (DESYREL) 50 MG tablet Take 50 mg by mouth at bedtime.   Yes [provider]  fluticasone (FLONASE) 50 MCG/ACT nasal spray Place 1 spray into both nostrils daily as needed for allergies or rhinitis.    [provider]    Allergies Patient has no known allergies.  Family History  Problem Relation Age of Onset  . Diabetes Mother   . Cancer Mother   . Hypertension Mother   . Cancer Father     Social History Social History   Tobacco Use  . Smoking status: Former Research scientist (life sciences)  . Smokeless tobacco: Never Used  Substance Use Topics  . Alcohol use: Yes    Comment: occasional wine noe in 1 week  . Drug use: No    Review of Systems  Constitutional: No fever. Eyes: No redness. ENT: No sore throat. Cardiovascular: Denies chest pain. Respiratory: Denies shortness of breath. Gastrointestinal: No vomiting or diarrhea.  Genitourinary: Negative for dysuria.  Musculoskeletal: Negative for back pain. Skin: Negative for rash. Neurological: Negative for headache.   ____________________________________________   PHYSICAL EXAM:  VITAL SIGNS: ED Triage Vitals  Enc Vitals Group     BP 07/27/18 1118 123/81     Pulse Rate 07/27/18 1118 65     Resp 07/27/18 1118 14     Temp 07/27/18 1118 (!) 97.5 F (36.4 C)     Temp Source 07/27/18 1118 Oral     SpO2 07/27/18 1118 97 %     Weight 07/27/18 1120 200 lb (90.7 kg)     Height 07/27/18 1120 5\' 9"  (1.753 m)     Head Circumference --      Peak Flow --      Pain Score 07/27/18 1120 0     Pain Loc --      Pain Edu? --      Excl. in [place]? --     Constitutional: Alert and oriented.  Relatively well appearing and in no acute distress. Eyes: Conjunctivae are normal.  EOMI.  PERRLA. Head: Atraumatic. Nose: No congestion/rhinnorhea. Mouth/Throat:  Mucous membranes are slightly dry.   Neck: Normal range of motion.  Cardiovascular: Normal rate, regular rhythm. Grossly normal heart sounds.  Good peripheral circulation. Respiratory: Normal respiratory effort.  No retractions. Lungs CTAB. Gastrointestinal: Soft and nontender. No distention.  Genitourinary: No flank tenderness. Musculoskeletal: No lower extremity edema.  Extremities warm and well perfused.  Neurologic:  Normal speech and language.  Motor intact in all extremities.  No facial droop.  Normal coordination.  No gross focal neurologic deficits are appreciated.  Skin:  Skin is warm and dry. No rash noted. Psychiatric: Anxious appearing.  Speech and behavior are normal.  ____________________________________________   LABS (all labs ordered are listed, but only  abnormal results are displayed)  Labs Reviewed  BASIC METABOLIC PANEL - Abnormal; Notable for the following components:      Result Value   Sodium 133 (*)    Creatinine, Ser 1.20 (*)    Calcium 7.9 (*)    GFR calc non Af Amer 44 (*)    GFR calc Af Amer 51 (*)    All other components within normal limits  CBC WITH DIFFERENTIAL/PLATELET - Abnormal; Notable for the following components:   RBC 3.49 (*)    Hemoglobin 10.9 (*)    HCT 33.2 (*)    All other components within normal limits  URINALYSIS, COMPLETE (UACMP) WITH MICROSCOPIC - Abnormal; Notable for the following components:   Color, Urine YELLOW (*)    APPearance CLEAR (*)    Leukocytes,Ua SMALL (*)    All other components within normal limits  TROPONIN I   ____________________________________________  EKG  ED ECG REPORT I, Arta Silence, the attending physician, personally viewed and interpreted this ECG.  Date: 07/27/2018 EKG Time: 1117 Rate: 66 Rhythm: normal sinus rhythm QRS Axis: normal Intervals: Incomplete RBBB ST/T Wave abnormalities: normal Narrative Interpretation: no evidence of acute  ischemia  ____________________________________________  RADIOLOGY    ____________________________________________   PROCEDURES  Procedure(s) performed: No  Procedures  Critical Care performed: No ____________________________________________   INITIAL IMPRESSION / ASSESSMENT AND PLAN / ED COURSE  Pertinent labs & imaging results that were available during my care of the patient were reviewed by me and considered in my medical decision making (see chart for details).  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  She states that she did not fully lose consciousness, and fell into a sitting position.  She did not hit her head.  She was feeling somewhat lightheaded all morning but states that she did eat a small energy bar for breakfast.  I reviewed the past medical records in Epic.  She was last seen in the ED in 2018 for chest pain with negative work-up.  On exam, the patient appears somewhat anxious but otherwise well.  Her vital signs are normal.  Neuro exam is nonfocal.  Her mucous membranes are slightly dry.  Overall suspect likely mild dehydration or hypovolemia, vasovagal near syncope, or other benign etiology.  We will give a fluid bolus, obtain lab work-up and UA and reassess.  ----------------------------------------- 3:20 PM on 07/27/2018 -----------------------------------------  Patient is feeling significantly better after fluids.  Her vital signs are stable.  The lab work-up is unremarkable except for mild anemia which is not significantly changed from her baseline.  The patient is feeling comfortable and would like to go home.  She is stable for discharge at this time.  Return precautions given, and she expresses understanding. ____________________________________________   FINAL CLINICAL IMPRESSION(S) / ED DIAGNOSES  Final diagnoses:  Near syncope      NEW MEDICATIONS STARTED  DURING THIS VISIT:  New Prescriptions   No medications on file     Note:  This document was prepared using Dragon voice recognition software and may include unintentional dictation errors.    Arta Silence, MD 07/27/18 [phone]

## 2018-07-29 DIAGNOSIS — E860 Dehydration: Secondary | ICD-10-CM | POA: Diagnosis not present

## 2018-07-29 DIAGNOSIS — D500 Iron deficiency anemia secondary to blood loss (chronic): Secondary | ICD-10-CM | POA: Diagnosis not present

## 2018-07-29 DIAGNOSIS — R55 Syncope and collapse: Secondary | ICD-10-CM | POA: Diagnosis not present

## 2018-08-07 DIAGNOSIS — M5416 Radiculopathy, lumbar region: Secondary | ICD-10-CM | POA: Diagnosis not present

## 2018-08-07 DIAGNOSIS — M48062 Spinal stenosis, lumbar region with neurogenic claudication: Secondary | ICD-10-CM | POA: Diagnosis not present

## 2018-08-07 DIAGNOSIS — M5136 Other intervertebral disc degeneration, lumbar region: Secondary | ICD-10-CM | POA: Diagnosis not present

## 2018-09-26 DIAGNOSIS — M1711 Unilateral primary osteoarthritis, right knee: Secondary | ICD-10-CM | POA: Diagnosis not present

## 2018-10-07 DIAGNOSIS — M5416 Radiculopathy, lumbar region: Secondary | ICD-10-CM | POA: Diagnosis not present

## 2018-10-07 DIAGNOSIS — M48062 Spinal stenosis, lumbar region with neurogenic claudication: Secondary | ICD-10-CM | POA: Diagnosis not present

## 2018-10-07 DIAGNOSIS — M5136 Other intervertebral disc degeneration, lumbar region: Secondary | ICD-10-CM | POA: Diagnosis not present

## 2018-11-10 DIAGNOSIS — M5136 Other intervertebral disc degeneration, lumbar region: Secondary | ICD-10-CM | POA: Diagnosis not present

## 2018-11-10 DIAGNOSIS — M5416 Radiculopathy, lumbar region: Secondary | ICD-10-CM | POA: Diagnosis not present

## 2018-11-10 DIAGNOSIS — M48062 Spinal stenosis, lumbar region with neurogenic claudication: Secondary | ICD-10-CM | POA: Diagnosis not present

## 2018-12-08 DIAGNOSIS — M5416 Radiculopathy, lumbar region: Secondary | ICD-10-CM | POA: Diagnosis not present

## 2018-12-08 DIAGNOSIS — M5136 Other intervertebral disc degeneration, lumbar region: Secondary | ICD-10-CM | POA: Diagnosis not present

## 2018-12-08 DIAGNOSIS — M48062 Spinal stenosis, lumbar region with neurogenic claudication: Secondary | ICD-10-CM | POA: Diagnosis not present

## 2018-12-22 DIAGNOSIS — E782 Mixed hyperlipidemia: Secondary | ICD-10-CM | POA: Diagnosis not present

## 2018-12-22 DIAGNOSIS — D500 Iron deficiency anemia secondary to blood loss (chronic): Secondary | ICD-10-CM | POA: Diagnosis not present

## 2018-12-22 DIAGNOSIS — M5136 Other intervertebral disc degeneration, lumbar region: Secondary | ICD-10-CM | POA: Diagnosis not present

## 2018-12-22 DIAGNOSIS — E538 Deficiency of other specified B group vitamins: Secondary | ICD-10-CM | POA: Diagnosis not present

## 2018-12-22 DIAGNOSIS — G63 Polyneuropathy in diseases classified elsewhere: Secondary | ICD-10-CM | POA: Diagnosis not present

## 2018-12-22 DIAGNOSIS — F330 Major depressive disorder, recurrent, mild: Secondary | ICD-10-CM | POA: Diagnosis not present

## 2019-02-26 DIAGNOSIS — M1711 Unilateral primary osteoarthritis, right knee: Secondary | ICD-10-CM | POA: Diagnosis not present

## 2019-03-05 ENCOUNTER — Other Ambulatory Visit: Payer: Self-pay | Admitting: Internal Medicine

## 2019-03-05 DIAGNOSIS — Z1231 Encounter for screening mammogram for malignant neoplasm of breast: Secondary | ICD-10-CM

## 2019-03-09 DIAGNOSIS — M1711 Unilateral primary osteoarthritis, right knee: Secondary | ICD-10-CM | POA: Diagnosis not present

## 2019-03-16 DIAGNOSIS — M1711 Unilateral primary osteoarthritis, right knee: Secondary | ICD-10-CM | POA: Diagnosis not present

## 2019-03-27 DIAGNOSIS — M5136 Other intervertebral disc degeneration, lumbar region: Secondary | ICD-10-CM | POA: Diagnosis not present

## 2019-03-27 DIAGNOSIS — M48062 Spinal stenosis, lumbar region with neurogenic claudication: Secondary | ICD-10-CM | POA: Diagnosis not present

## 2019-03-27 DIAGNOSIS — M5416 Radiculopathy, lumbar region: Secondary | ICD-10-CM | POA: Diagnosis not present

## 2019-06-04 DIAGNOSIS — G629 Polyneuropathy, unspecified: Secondary | ICD-10-CM | POA: Diagnosis not present

## 2019-06-04 DIAGNOSIS — K219 Gastro-esophageal reflux disease without esophagitis: Secondary | ICD-10-CM | POA: Diagnosis not present

## 2019-06-04 DIAGNOSIS — F329 Major depressive disorder, single episode, unspecified: Secondary | ICD-10-CM | POA: Diagnosis not present

## 2019-06-04 DIAGNOSIS — J209 Acute bronchitis, unspecified: Secondary | ICD-10-CM | POA: Diagnosis not present

## 2019-06-04 DIAGNOSIS — Z20828 Contact with and (suspected) exposure to other viral communicable diseases: Secondary | ICD-10-CM | POA: Diagnosis not present

## 2019-06-17 DIAGNOSIS — E538 Deficiency of other specified B group vitamins: Secondary | ICD-10-CM | POA: Diagnosis not present

## 2019-06-17 DIAGNOSIS — D500 Iron deficiency anemia secondary to blood loss (chronic): Secondary | ICD-10-CM | POA: Diagnosis not present

## 2019-06-17 DIAGNOSIS — E782 Mixed hyperlipidemia: Secondary | ICD-10-CM | POA: Diagnosis not present

## 2019-06-24 DIAGNOSIS — K2100 Gastro-esophageal reflux disease with esophagitis, without bleeding: Secondary | ICD-10-CM | POA: Diagnosis not present

## 2019-06-24 DIAGNOSIS — Z0000 Encounter for general adult medical examination without abnormal findings: Secondary | ICD-10-CM | POA: Diagnosis not present

## 2019-06-24 DIAGNOSIS — M1711 Unilateral primary osteoarthritis, right knee: Secondary | ICD-10-CM | POA: Diagnosis not present

## 2019-06-24 DIAGNOSIS — M5136 Other intervertebral disc degeneration, lumbar region: Secondary | ICD-10-CM | POA: Diagnosis not present

## 2019-06-24 DIAGNOSIS — F320 Major depressive disorder, single episode, mild: Secondary | ICD-10-CM | POA: Diagnosis not present

## 2019-07-10 ENCOUNTER — Ambulatory Visit
Admission: RE | Admit: 2019-07-10 | Discharge: 2019-07-10 | Disposition: A | Discharge status: Home / Self Care | Payer: Medicare HMO | Source: Ambulatory Visit | Attending: Internal Medicine | Admitting: Internal Medicine

## 2019-07-10 DIAGNOSIS — Z1231 Encounter for screening mammogram for malignant neoplasm of breast: Secondary | ICD-10-CM | POA: Diagnosis not present

## 2019-07-10 IMAGING — US US ABDOMEN LIMITED
1 series · 14 of 25 positions shown · non-contrast
Comparison: None.

CLINICAL DATA: Right upper quadrant pain

EXAM:
ULTRASOUND ABDOMEN LIMITED RIGHT UPPER QUADRANT

[Series 1: us abdomen limited · 0.23mm/px · 14 of 50 slices shown]
[im 1/50]
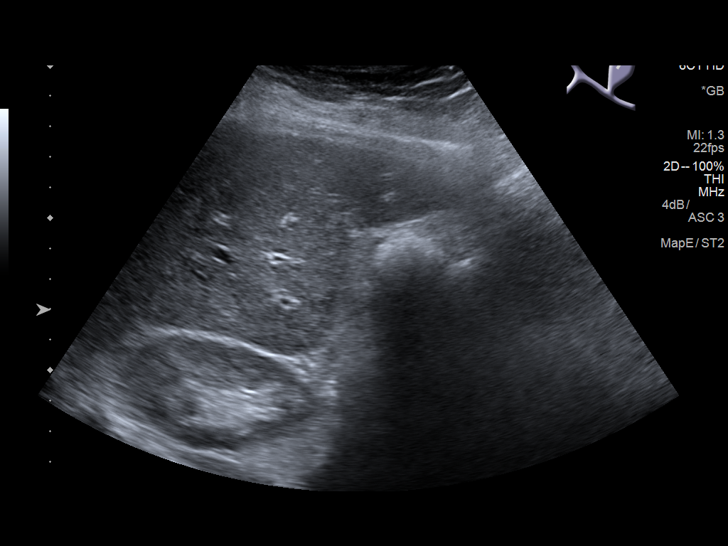
[im 5/50]
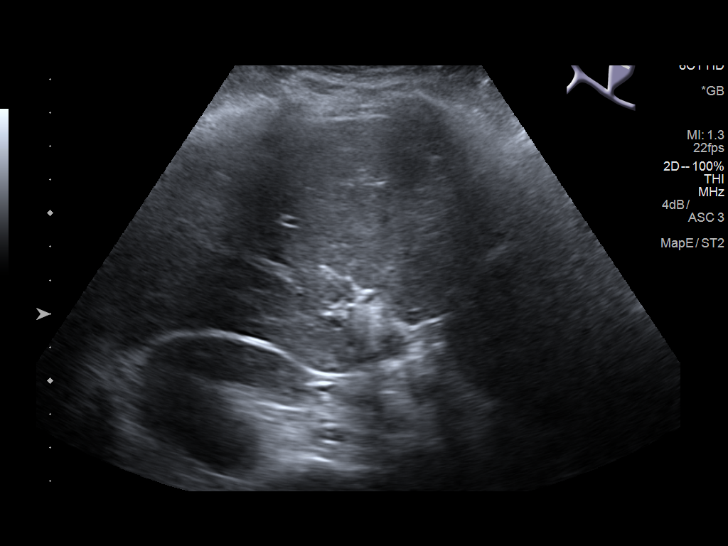
[im 9/50]
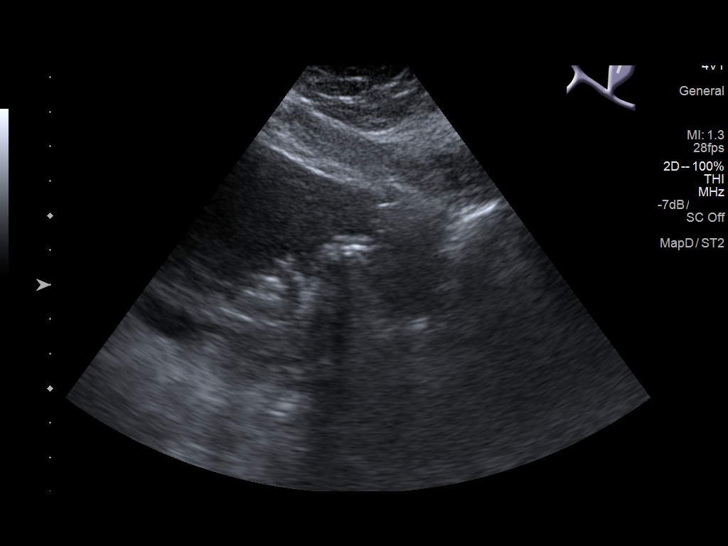
[im 13/50]
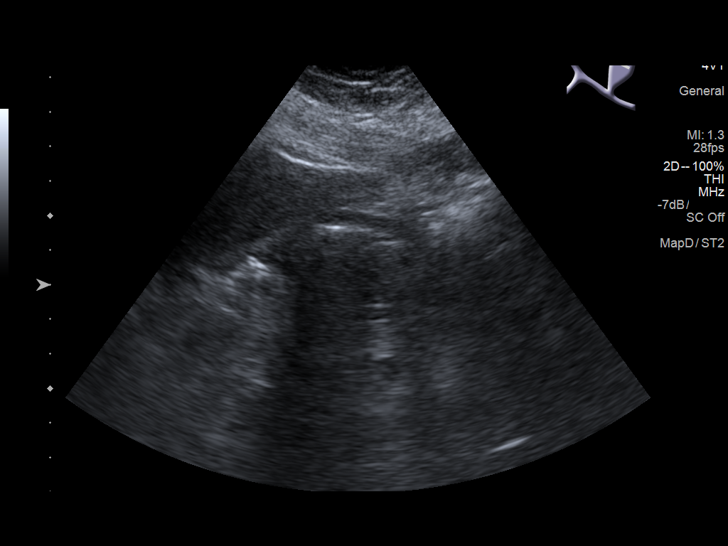
[im 17/50]
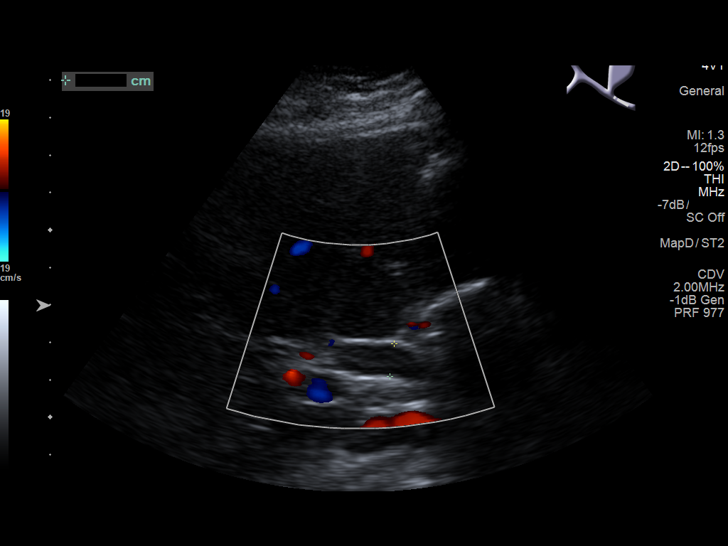
[im 19/50]
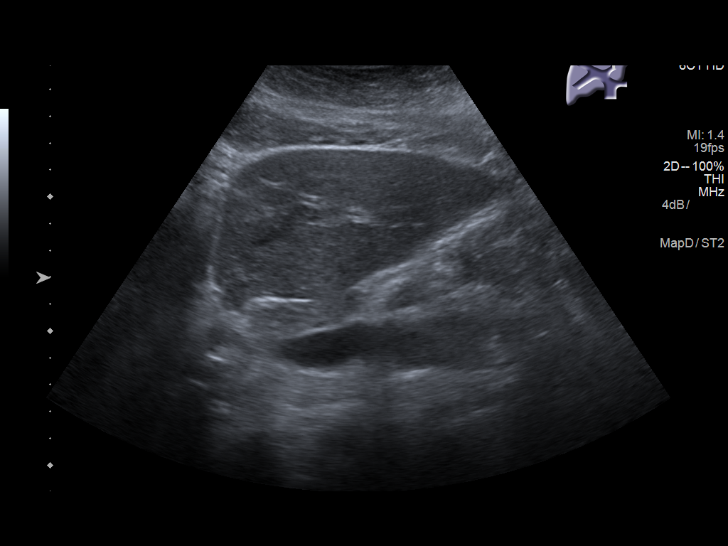
[im 23/50]
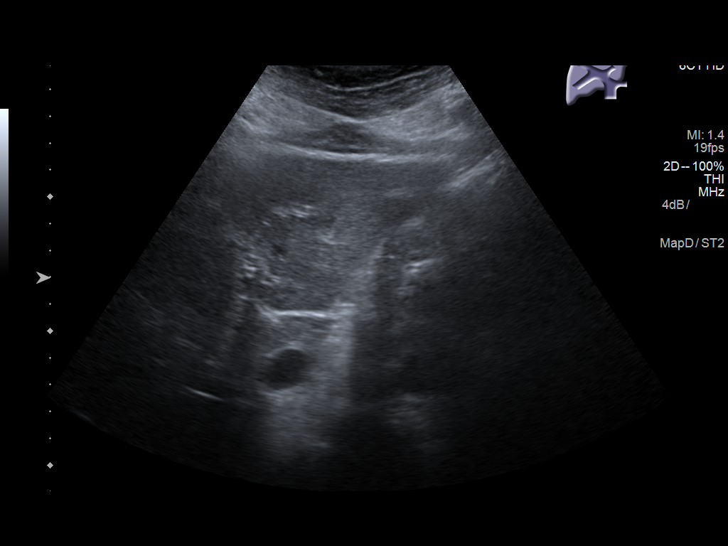
[im 27/50]
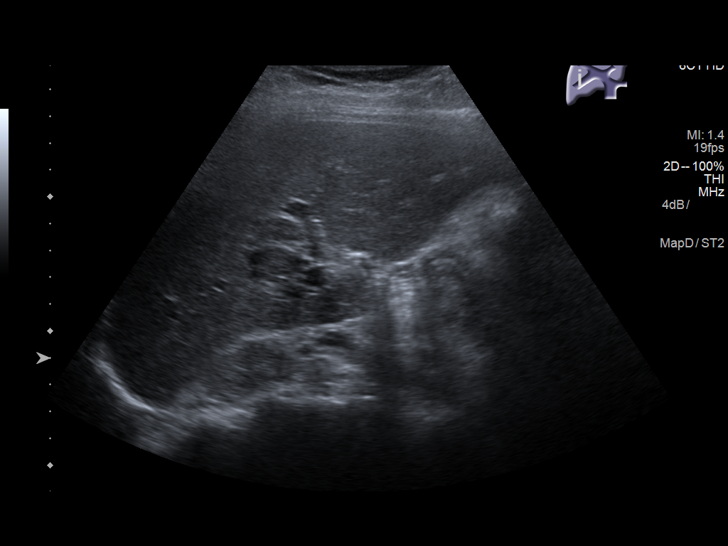
[im 31/50]
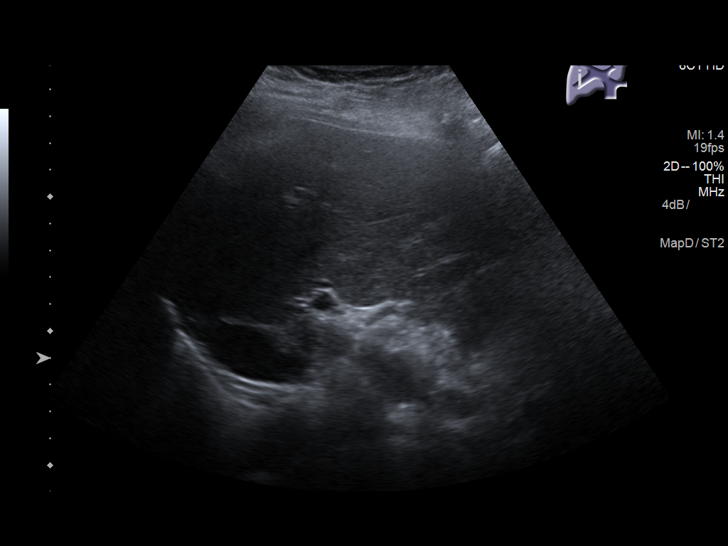
[im 33/50]
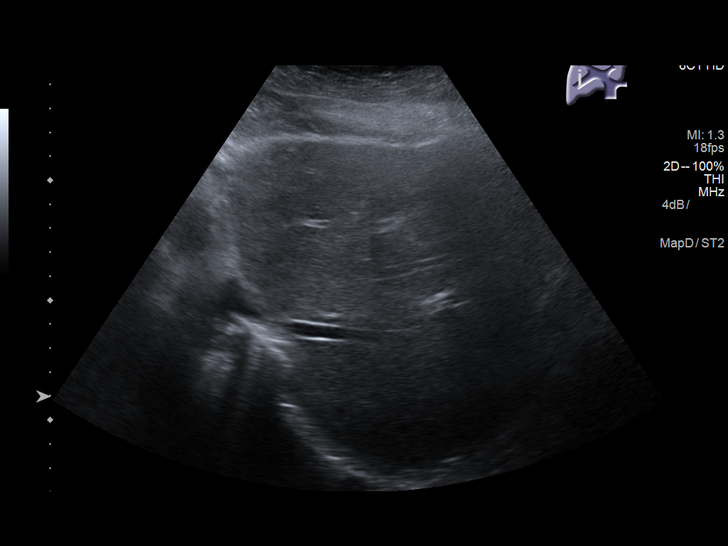
[im 37/50]
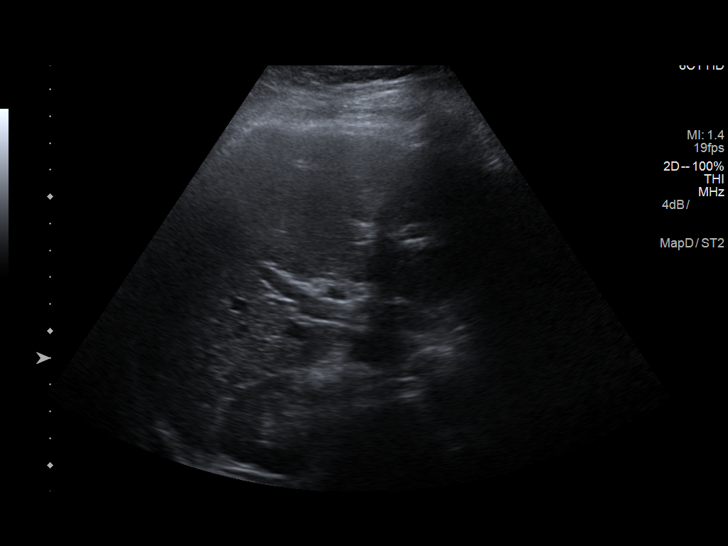
[im 41/50]
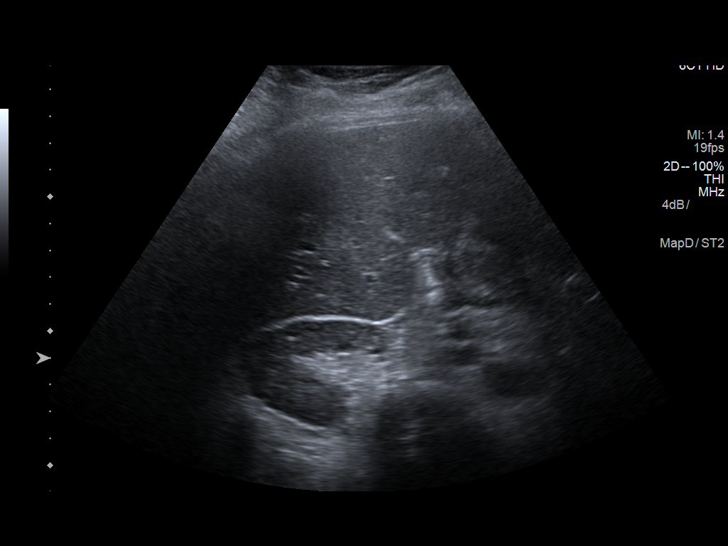
[im 45/50]
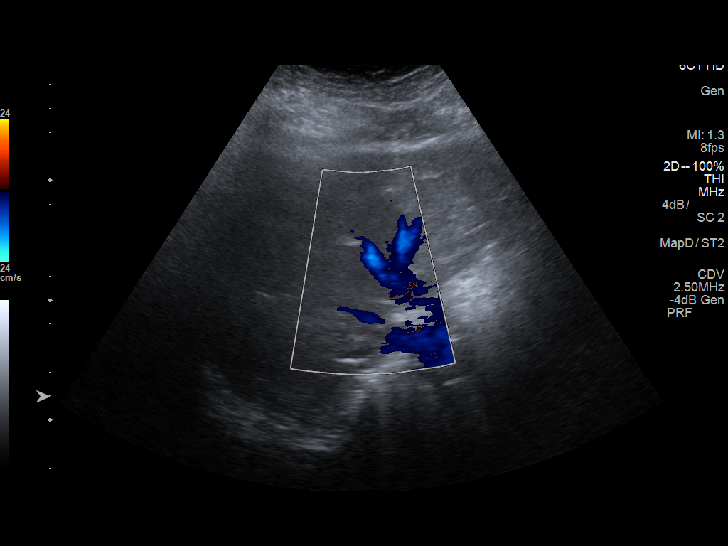
[im 50/50]
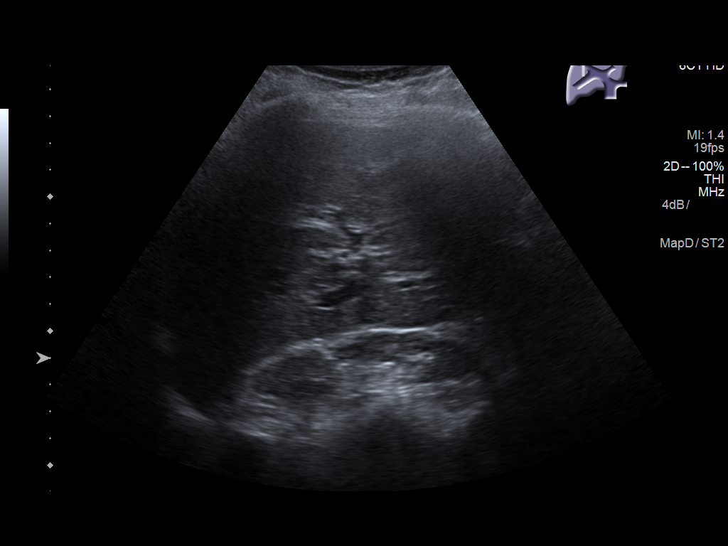

[14 of 25 positions shown; findings below may reference images not displayed]

FINDINGS: Gallbladder:

Contracted, filled with stones. Wall is difficult to visualize due
to the shadowing from stones. Negative sonographic [place].

Common bile duct:

Diameter: Dilated, 9 mm. The distal duct cannot be visualized due to
overlying bowel gas.

Liver:

No focal lesion identified. Within normal limits in parenchymal
echogenicity. Portal vein is patent on color Doppler imaging with
normal direction of blood flow towards the liver.
IMPRESSION: Cholelithiasis. Gallbladder is contracted and filled with
gallstones. Common bile duct dilated at 9 mm. The distal duct cannot
be visualized to evaluate for distal CBD stone. This could be
further evaluated with MRCP or ERCP if felt clinically indicated.

## 2019-07-27 DIAGNOSIS — N3281 Overactive bladder: Secondary | ICD-10-CM | POA: Diagnosis not present

## 2019-07-29 ENCOUNTER — Other Ambulatory Visit: Payer: Self-pay | Admitting: Surgery

## 2019-07-30 ENCOUNTER — Inpatient Hospital Stay: Admission: RE | Admit: 2019-07-30 | Payer: Medicare HMO | Source: Ambulatory Visit

## 2019-08-04 ENCOUNTER — Encounter
Admission: RE | Admit: 2019-08-04 | Discharge: 2019-08-04 | Disposition: A | Discharge status: Home / Self Care | Payer: Medicare HMO | Source: Ambulatory Visit | Attending: Surgery | Admitting: Surgery

## 2019-08-04 ENCOUNTER — Other Ambulatory Visit: Payer: Self-pay

## 2019-08-04 DIAGNOSIS — Z01818 Encounter for other preprocedural examination: Secondary | ICD-10-CM | POA: Insufficient documentation

## 2019-08-04 HISTORY — DX: Urge incontinence: N39.41

## 2019-08-04 HISTORY — DX: Depression, unspecified: F32.A

## 2019-08-04 HISTORY — DX: Headache, unspecified: R51.9

## 2019-08-04 HISTORY — DX: Hyperlipidemia, unspecified: E78.5

## 2019-08-04 NOTE — Patient Instructions (Signed)
Your procedure is scheduled on: Thurs 3/25 Report to Day Surgery. To find out your arrival time please call [phone] between 1PM - 3PM on .wed. 3/24  Remember: Instructions that are not followed completely may result in serious medical risk,  up to and including death, or upon the discretion of your surgeon and anesthesiologist your  surgery may need to be rescheduled.     _X__ 1. Do not eat food after midnight the night before your procedure.                 No gum chewing or hard candies. You may drink clear liquids up to 2 hours                 before you are scheduled to arrive for your surgery- DO not drink clear                 liquids within 2 hours of the start of your surgery.                 Clear Liquids include:  water, apple juice without pulp, clear Gatorade, G2 or                  Gatorade Zero (avoid Red/Purple/Blue), Black Coffee or Tea (Do not add                 anything to coffee or tea). __x___2.   Complete the carbohydrate drink provided to you, 2 hours before arrival.      ( helps with your recovery)  __X__2.  On the morning of surgery brush your teeth with toothpaste and water, you                may rinse your mouth with mouthwash if you wish.  Do not swallow any toothpaste of mouthwash.     _X__ 3.  No Alcohol for 24 hours before or after surgery.   __ 4.  Do Not Smoke or use e-cigarettes For 24 Hours Prior to Your Surgery.                 Do not use any chewable tobacco products for at least 6 hours prior to                 surgery.  ____  5.  Bring all medications with you on the day of surgery if instructed.   __x__  6.  Notify your doctor if there is any change in your medical condition      (cold, fever, infections).     Do not wear jewelry, make-up, hairpins, clips or nail polish. Do not wear lotions, powders, or perfumes. You may wear deodorant. Do not shave 48 hours prior to surgery. Men may shave face and neck. Do not  bring valuables to the hospital.    [facility] is not responsible for any belongings or valuables.  Contacts, dentures or bridgework may not be worn into surgery. Leave your suitcase in the car. After surgery it may be brought to your room. For patients admitted to the hospital, discharge time is determined by your treatment team.   Patients discharged the day of surgery will not be allowed to drive home.   Make arrangements for someone to be with you for the first 24 hours of your Same Day Discharge.    Please read over the following fact sheets that you were given:       __x__ Take these  medicines the morning of surgery with A SIP OF WATER:    1. buPROPion (WELLBUTRIN XL) 150 MG 24 hr tablet  2. fexofenadine (ALLEGRA) 180 MG tablet  3. fluticasone (FLONASE) 50 MCG/ACT nasal spray  4.pantoprazole (PROTONIX) 40 MG tablet  5.sucralfate (CARAFATE) 1 g tablet  6.traMADol (ULTRAM) 50 MG tablet     If needed  ____ Fleet Enema (as directed)   __x__ Use CHG Soap (or wipes) as directed  ____ Use Benzoyl Peroxide Gel as instructed  ____ Use inhalers on the day of surgery  ____ Stop metformin 2 days prior to surgery    ____ Take 1/2 of usual insulin dose the night before surgery. No insulin the morning          of surgery.   __x__ Stop aspirin on  3/18  __x__ Stop Anti-inflammatories no ibuprofen or aleve 3/18    ___x_ Stop supplements until after surgery.  Glucosamine-Chondroitin 500-400 MG CAPS on 3/18  ____ Bring C-Pap to the hospital.

## 2019-08-07 ENCOUNTER — Other Ambulatory Visit: Payer: Self-pay

## 2019-08-07 ENCOUNTER — Encounter
Admission: RE | Admit: 2019-08-07 | Discharge: 2019-08-07 | Disposition: A | Discharge status: Home / Self Care | Payer: Medicare HMO | Source: Ambulatory Visit | Attending: Surgery | Admitting: Surgery

## 2019-08-07 DIAGNOSIS — M1711 Unilateral primary osteoarthritis, right knee: Secondary | ICD-10-CM | POA: Diagnosis not present

## 2019-08-07 DIAGNOSIS — Z01810 Encounter for preprocedural cardiovascular examination: Secondary | ICD-10-CM | POA: Diagnosis not present

## 2019-08-07 DIAGNOSIS — Z01818 Encounter for other preprocedural examination: Secondary | ICD-10-CM | POA: Diagnosis not present

## 2019-08-07 LAB — CBC WITH DIFFERENTIAL/PLATELET
Abs Immature Granulocytes: 0.03 10*3/uL (ref 0.00–0.07)
Basophils Absolute: 0 10*3/uL (ref 0.0–0.1)
Basophils Relative: 0 %
Eosinophils Absolute: 0.1 10*3/uL (ref 0.0–0.5)
Eosinophils Relative: 2 %
HCT: 33.8 % — ABNORMAL LOW (ref 36.0–46.0)
Hemoglobin: 11.5 g/dL — ABNORMAL LOW (ref 12.0–15.0)
Immature Granulocytes: 1 %
Lymphocytes Relative: 28 %
Lymphs Abs: 1.5 10*3/uL (ref 0.7–4.0)
MCH: 32.8 pg (ref 26.0–34.0)
MCHC: 34 g/dL (ref 30.0–36.0)
MCV: 96.3 fL (ref 80.0–100.0)
Monocytes Absolute: 0.5 10*3/uL (ref 0.1–1.0)
Monocytes Relative: 10 %
Neutro Abs: 3.1 10*3/uL (ref 1.7–7.7)
Neutrophils Relative %: 59 %
Platelets: 227 10*3/uL (ref 150–400)
RBC: 3.51 MIL/uL — ABNORMAL LOW (ref 3.87–5.11)
RDW: 12 % (ref 11.5–15.5)
WBC: 5.3 10*3/uL (ref 4.0–10.5)
nRBC: 0 % (ref 0.0–0.2)

## 2019-08-07 LAB — SURGICAL PCR SCREEN
MRSA, PCR: NEGATIVE
Staphylococcus aureus: NEGATIVE

## 2019-08-07 LAB — URINALYSIS, ROUTINE W REFLEX MICROSCOPIC
Bilirubin Urine: NEGATIVE
Glucose, UA: NEGATIVE mg/dL
Hgb urine dipstick: NEGATIVE
Ketones, ur: NEGATIVE mg/dL
Nitrite: NEGATIVE
Protein, ur: NEGATIVE mg/dL
Specific Gravity, Urine: 1.02 (ref 1.005–1.030)
pH: 7.5 (ref 5.0–8.0)

## 2019-08-07 LAB — URINALYSIS, MICROSCOPIC (REFLEX)

## 2019-08-07 LAB — COMPREHENSIVE METABOLIC PANEL
ALT: 18 U/L (ref 0–44)
AST: 17 U/L (ref 15–41)
Albumin: 3.9 g/dL (ref 3.5–5.0)
Alkaline Phosphatase: 58 U/L (ref 38–126)
Anion gap: 10 (ref 5–15)
BUN: 14 mg/dL (ref 8–23)
CO2: 27 mmol/L (ref 22–32)
Calcium: 9.3 mg/dL (ref 8.9–10.3)
Chloride: 98 mmol/L (ref 98–111)
Creatinine, Ser: 0.77 mg/dL (ref 0.44–1.00)
GFR calc Af Amer: >60 mL/min (ref >60)
GFR calc non Af Amer: >60 mL/min (ref >60)
Glucose, Bld: 98 mg/dL (ref 70–99)
Potassium: 4.1 mmol/L (ref 3.5–5.1)
Sodium: 135 mmol/L (ref 135–145)
Total Bilirubin: 0.7 mg/dL (ref 0.3–1.2)
Total Protein: 6.4 g/dL — ABNORMAL LOW (ref 6.5–8.1)

## 2019-08-08 LAB — TYPE AND SCREEN
ABO/RH(D): A POS
Antibody Screen: NEGATIVE

## 2019-08-11 ENCOUNTER — Other Ambulatory Visit
Admission: RE | Admit: 2019-08-11 | Discharge: 2019-08-11 | Disposition: A | Discharge status: Home / Self Care | Payer: Medicare HMO | Source: Ambulatory Visit | Attending: Surgery | Admitting: Surgery

## 2019-08-11 ENCOUNTER — Other Ambulatory Visit: Payer: Self-pay

## 2019-08-11 ENCOUNTER — Other Ambulatory Visit: Payer: Medicare HMO

## 2019-08-11 DIAGNOSIS — M5416 Radiculopathy, lumbar region: Secondary | ICD-10-CM | POA: Diagnosis not present

## 2019-08-11 DIAGNOSIS — M48062 Spinal stenosis, lumbar region with neurogenic claudication: Secondary | ICD-10-CM | POA: Diagnosis not present

## 2019-08-11 DIAGNOSIS — Z7982 Long term (current) use of aspirin: Secondary | ICD-10-CM | POA: Diagnosis not present

## 2019-08-11 DIAGNOSIS — Z85828 Personal history of other malignant neoplasm of skin: Secondary | ICD-10-CM | POA: Diagnosis not present

## 2019-08-11 DIAGNOSIS — Z471 Aftercare following joint replacement surgery: Secondary | ICD-10-CM | POA: Diagnosis not present

## 2019-08-11 DIAGNOSIS — Z20822 Contact with and (suspected) exposure to covid-19: Secondary | ICD-10-CM | POA: Diagnosis present

## 2019-08-11 DIAGNOSIS — K219 Gastro-esophageal reflux disease without esophagitis: Secondary | ICD-10-CM | POA: Diagnosis present

## 2019-08-11 DIAGNOSIS — E785 Hyperlipidemia, unspecified: Secondary | ICD-10-CM | POA: Diagnosis present

## 2019-08-11 DIAGNOSIS — M1711 Unilateral primary osteoarthritis, right knee: Secondary | ICD-10-CM | POA: Diagnosis present

## 2019-08-11 DIAGNOSIS — Z79899 Other long term (current) drug therapy: Secondary | ICD-10-CM | POA: Diagnosis not present

## 2019-08-11 DIAGNOSIS — Z96651 Presence of right artificial knee joint: Secondary | ICD-10-CM | POA: Diagnosis not present

## 2019-08-11 DIAGNOSIS — M5136 Other intervertebral disc degeneration, lumbar region: Secondary | ICD-10-CM | POA: Diagnosis present

## 2019-08-11 LAB — SARS CORONAVIRUS 2 (TAT 6-24 HRS): SARS Coronavirus 2: NEGATIVE

## 2019-08-13 ENCOUNTER — Inpatient Hospital Stay: Payer: Medicare HMO

## 2019-08-13 ENCOUNTER — Encounter
Admission: RE | Disposition: A | Discharge status: Home / Self Care | Payer: Self-pay | Source: Home / Self Care | Attending: Surgery

## 2019-08-13 ENCOUNTER — Encounter: Payer: Self-pay | Admitting: Surgery

## 2019-08-13 ENCOUNTER — Inpatient Hospital Stay: Payer: Medicare HMO | Admitting: Anesthesiology

## 2019-08-13 ENCOUNTER — Inpatient Hospital Stay
Admission: RE | Admit: 2019-08-13 | Discharge: 2019-08-16 | DRG: 470 | Disposition: A | Discharge status: Home Health | Payer: Medicare HMO | Attending: Surgery | Admitting: Surgery

## 2019-08-13 ENCOUNTER — Other Ambulatory Visit: Payer: Self-pay

## 2019-08-13 DIAGNOSIS — Z96651 Presence of right artificial knee joint: Secondary | ICD-10-CM

## 2019-08-13 DIAGNOSIS — M1711 Unilateral primary osteoarthritis, right knee: Principal | ICD-10-CM | POA: Diagnosis present

## 2019-08-13 DIAGNOSIS — K219 Gastro-esophageal reflux disease without esophagitis: Secondary | ICD-10-CM | POA: Diagnosis present

## 2019-08-13 DIAGNOSIS — Z79899 Other long term (current) drug therapy: Secondary | ICD-10-CM | POA: Diagnosis not present

## 2019-08-13 DIAGNOSIS — Z20822 Contact with and (suspected) exposure to covid-19: Secondary | ICD-10-CM | POA: Diagnosis present

## 2019-08-13 DIAGNOSIS — E785 Hyperlipidemia, unspecified: Secondary | ICD-10-CM | POA: Diagnosis present

## 2019-08-13 DIAGNOSIS — Z85828 Personal history of other malignant neoplasm of skin: Secondary | ICD-10-CM | POA: Diagnosis not present

## 2019-08-13 DIAGNOSIS — Z7982 Long term (current) use of aspirin: Secondary | ICD-10-CM

## 2019-08-13 DIAGNOSIS — M5136 Other intervertebral disc degeneration, lumbar region: Secondary | ICD-10-CM | POA: Diagnosis present

## 2019-08-13 HISTORY — PX: TOTAL KNEE ARTHROPLASTY: SHX125

## 2019-08-13 LAB — ABO/RH: ABO/RH(D): A POS

## 2019-08-13 SURGERY — ARTHROPLASTY, KNEE, TOTAL
Anesthesia: Spinal | Site: Knee | Laterality: Right

## 2019-08-13 MED ORDER — METOCLOPRAMIDE HCL 10 MG PO TABS: 5.0000 mg | ORAL_TABLET | Freq: Three times a day (TID) | ORAL | Status: DC | PRN

## 2019-08-13 MED ORDER — BUPIVACAINE LIPOSOME 1.3 % IJ SUSP
INTRAMUSCULAR | Status: AC
  Filled 2019-08-13: qty 20

## 2019-08-13 MED ORDER — OXYCODONE HCL 5 MG/5ML PO SOLN: 5.0000 mg | Freq: Once | ORAL | Status: DC | PRN

## 2019-08-13 MED ORDER — ASPIRIN EC 81 MG PO TBEC
81.0000 mg | DELAYED_RELEASE_TABLET | Freq: Every day | ORAL | Status: DC
  Administered 2019-08-13 – 2019-08-16 (×4): 81 mg via ORAL
  Filled 2019-08-13 (×4): qty 1

## 2019-08-13 MED ORDER — ACETAMINOPHEN 10 MG/ML IV SOLN
INTRAVENOUS | Status: DC | PRN
  Administered 2019-08-13: 1000 mg via INTRAVENOUS

## 2019-08-13 MED ORDER — GLUCOSAMINE-CHONDROITIN 500-400 MG PO CAPS: 1.0000 | ORAL_CAPSULE | Freq: Every day | ORAL | Status: DC

## 2019-08-13 MED ORDER — ENOXAPARIN SODIUM 40 MG/0.4ML ~~LOC~~ SOLN: 40.0000 mg | SUBCUTANEOUS | 0 refills | Status: DC

## 2019-08-13 MED ORDER — BUPIVACAINE-EPINEPHRINE (PF) 0.5% -1:200000 IJ SOLN
INTRAMUSCULAR | Status: DC | PRN
  Administered 2019-08-13: 30 mL via PERINEURAL

## 2019-08-13 MED ORDER — CHLORHEXIDINE GLUCONATE 4 % EX LIQD
60.0000 mL | Freq: Once | CUTANEOUS | Status: AC
  Administered 2019-08-13: 4 via TOPICAL

## 2019-08-13 MED ORDER — ONDANSETRON HCL 4 MG/2ML IJ SOLN: 4.0000 mg | Freq: Four times a day (QID) | INTRAMUSCULAR | Status: DC | PRN

## 2019-08-13 MED ORDER — ONDANSETRON HCL 4 MG PO TABS: 4.0000 mg | ORAL_TABLET | Freq: Four times a day (QID) | ORAL | Status: DC | PRN

## 2019-08-13 MED ORDER — LACTATED RINGERS IV SOLN: INTRAVENOUS | Status: DC

## 2019-08-13 MED ORDER — ACETAMINOPHEN 10 MG/ML IV SOLN
INTRAVENOUS | Status: AC
  Filled 2019-08-13: qty 100

## 2019-08-13 MED ORDER — SUCRALFATE 1 G PO TABS
1.0000 g | ORAL_TABLET | Freq: Three times a day (TID) | ORAL | Status: DC
  Administered 2019-08-13 – 2019-08-16 (×11): 1 g via ORAL
  Filled 2019-08-13 (×11): qty 1

## 2019-08-13 MED ORDER — TRAZODONE HCL 50 MG PO TABS
50.0000 mg | ORAL_TABLET | Freq: Every evening | ORAL | Status: DC | PRN
  Administered 2019-08-13 – 2019-08-14 (×2): 50 mg via ORAL
  Filled 2019-08-13 (×2): qty 1

## 2019-08-13 MED ORDER — DOCUSATE SODIUM 100 MG PO CAPS
100.0000 mg | ORAL_CAPSULE | Freq: Two times a day (BID) | ORAL | Status: DC
  Administered 2019-08-13 – 2019-08-16 (×7): 100 mg via ORAL
  Filled 2019-08-13 (×7): qty 1

## 2019-08-13 MED ORDER — OXYCODONE HCL 5 MG PO TABS
5.0000 mg | ORAL_TABLET | ORAL | Status: DC | PRN
  Administered 2019-08-13: 10 mg via ORAL
  Administered 2019-08-13: 5 mg via ORAL
  Administered 2019-08-13: 10 mg via ORAL
  Administered 2019-08-14: 5 mg via ORAL
  Administered 2019-08-14 (×3): 10 mg via ORAL
  Administered 2019-08-15: 5 mg via ORAL
  Administered 2019-08-15 (×2): 10 mg via ORAL
  Administered 2019-08-15: 5 mg via ORAL
  Administered 2019-08-16: 10 mg via ORAL
  Filled 2019-08-13 (×4): qty 2
  Filled 2019-08-13 (×2): qty 1
  Filled 2019-08-13 (×2): qty 2
  Filled 2019-08-13: qty 1
  Filled 2019-08-13 (×2): qty 2
  Filled 2019-08-13: qty 1

## 2019-08-13 MED ORDER — ENOXAPARIN SODIUM 40 MG/0.4ML ~~LOC~~ SOLN
40.0000 mg | SUBCUTANEOUS | Status: DC
  Administered 2019-08-14 – 2019-08-16 (×3): 40 mg via SUBCUTANEOUS
  Filled 2019-08-13 (×3): qty 0.4

## 2019-08-13 MED ORDER — METOCLOPRAMIDE HCL 5 MG/ML IJ SOLN: 5.0000 mg | Freq: Three times a day (TID) | INTRAMUSCULAR | Status: DC | PRN

## 2019-08-13 MED ORDER — FENTANYL CITRATE (PF) 100 MCG/2ML IJ SOLN
25.0000 ug | INTRAMUSCULAR | Status: DC | PRN
Start: 2019-08-13 — End: 2019-08-13

## 2019-08-13 MED ORDER — CALCIUM CARBONATE-VITAMIN D 500-200 MG-UNIT PO TABS
1.0000 | ORAL_TABLET | ORAL | Status: DC
  Administered 2019-08-13 – 2019-08-15 (×2): 1 via ORAL
  Filled 2019-08-13 (×4): qty 1

## 2019-08-13 MED ORDER — SODIUM CHLORIDE 0.9 % IV SOLN: INTRAVENOUS | Status: DC

## 2019-08-13 MED ORDER — ADULT MULTIVITAMIN W/MINERALS CH
1.0000 | ORAL_TABLET | Freq: Every day | ORAL | Status: DC
  Administered 2019-08-13 – 2019-08-16 (×4): 1 via ORAL
  Filled 2019-08-13 (×4): qty 1

## 2019-08-13 MED ORDER — BUPROPION HCL ER (XL) 150 MG PO TB24
150.0000 mg | ORAL_TABLET | Freq: Every day | ORAL | Status: DC
  Administered 2019-08-13 – 2019-08-16 (×4): 150 mg via ORAL
  Filled 2019-08-13 (×4): qty 1

## 2019-08-13 MED ORDER — MAGNESIUM HYDROXIDE 400 MG/5ML PO SUSP
30.0000 mL | Freq: Every day | ORAL | Status: DC | PRN
  Filled 2019-08-13: qty 30

## 2019-08-13 MED ORDER — FLUTICASONE PROPIONATE 50 MCG/ACT NA SUSP
1.0000 | Freq: Every day | NASAL | Status: DC | PRN
  Filled 2019-08-13: qty 16

## 2019-08-13 MED ORDER — LACTATED RINGERS IV SOLN: INTRAVENOUS | Status: DC | PRN

## 2019-08-13 MED ORDER — ACETAMINOPHEN 500 MG PO TABS
1000.0000 mg | ORAL_TABLET | Freq: Four times a day (QID) | ORAL | Status: AC
  Administered 2019-08-13 – 2019-08-14 (×3): 1000 mg via ORAL
  Filled 2019-08-13 (×4): qty 2

## 2019-08-13 MED ORDER — BUPIVACAINE HCL (PF) 0.5 % IJ SOLN
INTRAMUSCULAR | Status: DC | PRN
  Administered 2019-08-13: 3 mg

## 2019-08-13 MED ORDER — TRANEXAMIC ACID 1000 MG/10ML IV SOLN
INTRAVENOUS | Status: DC | PRN
  Administered 2019-08-13: 09:00:00 1000 mg via TOPICAL

## 2019-08-13 MED ORDER — DIPHENHYDRAMINE HCL 12.5 MG/5ML PO ELIX: 12.5000 mg | ORAL_SOLUTION | ORAL | Status: DC | PRN

## 2019-08-13 MED ORDER — TRANEXAMIC ACID 1000 MG/10ML IV SOLN
INTRAVENOUS | Status: AC
  Filled 2019-08-13: qty 10

## 2019-08-13 MED ORDER — PROPOFOL 500 MG/50ML IV EMUL
INTRAVENOUS | Status: DC | PRN
  Administered 2019-08-13: 100 ug/kg/min via INTRAVENOUS

## 2019-08-13 MED ORDER — OXYCODONE HCL 5 MG PO TABS: 5.0000 mg | ORAL_TABLET | Freq: Once | ORAL | Status: DC | PRN

## 2019-08-13 MED ORDER — DEXAMETHASONE SODIUM PHOSPHATE 10 MG/ML IJ SOLN
INTRAMUSCULAR | Status: AC
  Filled 2019-08-13: qty 1

## 2019-08-13 MED ORDER — CEFAZOLIN SODIUM-DEXTROSE 2-4 GM/100ML-% IV SOLN
2.0000 g | Freq: Four times a day (QID) | INTRAVENOUS | Status: AC
  Administered 2019-08-13 – 2019-08-14 (×3): 2 g via INTRAVENOUS
  Filled 2019-08-13 (×3): qty 100

## 2019-08-13 MED ORDER — EPINEPHRINE PF 1 MG/ML IJ SOLN
INTRAMUSCULAR | Status: AC
  Filled 2019-08-13: qty 1

## 2019-08-13 MED ORDER — PANTOPRAZOLE SODIUM 40 MG PO TBEC
40.0000 mg | DELAYED_RELEASE_TABLET | Freq: Every day | ORAL | Status: DC
  Administered 2019-08-13 – 2019-08-16 (×4): 40 mg via ORAL
  Filled 2019-08-13 (×4): qty 1

## 2019-08-13 MED ORDER — ONDANSETRON HCL 4 MG/2ML IJ SOLN
INTRAMUSCULAR | Status: AC
  Filled 2019-08-13: qty 2

## 2019-08-13 MED ORDER — FAMOTIDINE 20 MG PO TABS
40.0000 mg | ORAL_TABLET | Freq: Every day | ORAL | Status: DC
  Administered 2019-08-13 – 2019-08-15 (×3): 40 mg via ORAL
  Filled 2019-08-13 (×3): qty 2

## 2019-08-13 MED ORDER — TRAMADOL HCL 50 MG PO TABS: 50.0000 mg | ORAL_TABLET | Freq: Four times a day (QID) | ORAL | 0 refills | Status: DC | PRN

## 2019-08-13 MED ORDER — PROPOFOL 500 MG/50ML IV EMUL
INTRAVENOUS | Status: AC
  Filled 2019-08-13: qty 50

## 2019-08-13 MED ORDER — ACETAMINOPHEN 325 MG PO TABS
325.0000 mg | ORAL_TABLET | Freq: Four times a day (QID) | ORAL | Status: DC | PRN
  Administered 2019-08-14 – 2019-08-15 (×2): 650 mg via ORAL
  Filled 2019-08-13 (×2): qty 2

## 2019-08-13 MED ORDER — BUPIVACAINE HCL (PF) 0.5 % IJ SOLN
INTRAMUSCULAR | Status: AC
  Filled 2019-08-13: qty 30

## 2019-08-13 MED ORDER — HYDROMORPHONE HCL 1 MG/ML IJ SOLN: 0.2500 mg | INTRAMUSCULAR | Status: DC | PRN

## 2019-08-13 MED ORDER — LORATADINE 10 MG PO TABS
10.0000 mg | ORAL_TABLET | Freq: Every day | ORAL | Status: DC
  Administered 2019-08-13 – 2019-08-16 (×4): 10 mg via ORAL
  Filled 2019-08-13 (×4): qty 1

## 2019-08-13 MED ORDER — OXYCODONE HCL 5 MG PO TABS: 5.0000 mg | ORAL_TABLET | ORAL | 0 refills | Status: DC | PRN

## 2019-08-13 MED ORDER — FLEET ENEMA 7-19 GM/118ML RE ENEM: 1.0000 | ENEMA | Freq: Once | RECTAL | Status: DC | PRN

## 2019-08-13 MED ORDER — ONDANSETRON HCL 4 MG/2ML IJ SOLN
INTRAMUSCULAR | Status: DC | PRN
  Administered 2019-08-13: 4 mg via INTRAVENOUS

## 2019-08-13 MED ORDER — CYCLOBENZAPRINE HCL 10 MG PO TABS
10.0000 mg | ORAL_TABLET | Freq: Three times a day (TID) | ORAL | Status: DC | PRN
  Administered 2019-08-14 – 2019-08-15 (×3): 10 mg via ORAL
  Filled 2019-08-13 (×3): qty 1

## 2019-08-13 MED ORDER — SODIUM CHLORIDE FLUSH 0.9 % IV SOLN
INTRAVENOUS | Status: AC
  Filled 2019-08-13: qty 40

## 2019-08-13 MED ORDER — DEXAMETHASONE SODIUM PHOSPHATE 10 MG/ML IJ SOLN
INTRAMUSCULAR | Status: DC | PRN
  Administered 2019-08-13: 5 mg via INTRAVENOUS

## 2019-08-13 MED ORDER — SODIUM CHLORIDE 0.9 % IV SOLN
INTRAVENOUS | Status: DC | PRN
  Administered 2019-08-13: 09:00:00 60 mL

## 2019-08-13 MED ORDER — GABAPENTIN 300 MG PO CAPS
300.0000 mg | ORAL_CAPSULE | Freq: Every day | ORAL | Status: DC
  Administered 2019-08-13: 300 mg via ORAL
  Administered 2019-08-14: 600 mg via ORAL
  Administered 2019-08-15: 300 mg via ORAL
  Filled 2019-08-13: qty 1
  Filled 2019-08-13: qty 2
  Filled 2019-08-13: qty 1

## 2019-08-13 MED ORDER — TRAMADOL HCL 50 MG PO TABS
50.0000 mg | ORAL_TABLET | Freq: Four times a day (QID) | ORAL | Status: DC | PRN
  Administered 2019-08-14 – 2019-08-16 (×4): 50 mg via ORAL
  Filled 2019-08-13 (×4): qty 1

## 2019-08-13 MED ORDER — BISACODYL 10 MG RE SUPP: 10.0000 mg | Freq: Every day | RECTAL | Status: DC | PRN

## 2019-08-13 MED ORDER — CEFAZOLIN SODIUM-DEXTROSE 2-4 GM/100ML-% IV SOLN
2.0000 g | INTRAVENOUS | Status: AC
  Administered 2019-08-13: 2 g via INTRAVENOUS

## 2019-08-13 SURGICAL SUPPLY — 61 items
BLADE SAW SAG 25X90X1.19 (BLADE) ×2 IMPLANT
BLADE SURG SZ20 CARB STEEL (BLADE) ×2 IMPLANT
BNDG ELASTIC 6X5.8 VLCR NS LF (GAUZE/BANDAGES/DRESSINGS) ×2 IMPLANT
CANISTER SUCT 1200ML W/VALVE (MISCELLANEOUS) ×2 IMPLANT
CANISTER SUCT 3000ML PPV (MISCELLANEOUS) ×2 IMPLANT
CEMENT BONE R 1X40 (Cement) ×4 IMPLANT
CEMENT VACUUM MIXING SYSTEM (MISCELLANEOUS) ×2 IMPLANT
CHLORAPREP W/TINT 26 (MISCELLANEOUS) ×2 IMPLANT
COMP FEMORAL CRUC RIGHT 72.5 (Joint) ×2 IMPLANT
COMPONENT FEMRL CRUC RT 72.5 (Joint) IMPLANT
COOLER POLAR GLACIER W/PUMP (MISCELLANEOUS) ×2 IMPLANT
COVER MAYO STAND REUSABLE (DRAPES) ×2 IMPLANT
COVER WAND RF STERILE (DRAPES) ×2 IMPLANT
CUFF TOURN SGL QUICK 24 (TOURNIQUET CUFF)
CUFF TOURN SGL QUICK 30 (TOURNIQUET CUFF) ×1
CUFF TRNQT CYL 24X4X16.5-23 (TOURNIQUET CUFF) IMPLANT
CUFF TRNQT CYL 30X4X21-28X (TOURNIQUET CUFF) IMPLANT
DRAPE 3/4 80X56 (DRAPES) ×2 IMPLANT
DRAPE IMP U-DRAPE 54X76 (DRAPES) ×1 IMPLANT
DRSG OPSITE POSTOP 4X10 (GAUZE/BANDAGES/DRESSINGS) ×2 IMPLANT
DRSG OPSITE POSTOP 4X8 (GAUZE/BANDAGES/DRESSINGS) ×2 IMPLANT
ELECT CAUTERY BLADE 6.4 (BLADE) ×2 IMPLANT
ELECT REM PT RETURN 9FT ADLT (ELECTROSURGICAL) ×2
ELECTRODE REM PT RTRN 9FT ADLT (ELECTROSURGICAL) ×1 IMPLANT
GLOVE BIO SURGEON STRL SZ7.5 (GLOVE) ×8 IMPLANT
GLOVE BIO SURGEON STRL SZ8 (GLOVE) ×8 IMPLANT
GLOVE BIOGEL PI IND STRL 8 (GLOVE) ×1 IMPLANT
GLOVE BIOGEL PI INDICATOR 8 (GLOVE) ×1
GLOVE INDICATOR 8.0 STRL GRN (GLOVE) ×2 IMPLANT
GOWN STRL REUS W/ TWL LRG LVL3 (GOWN DISPOSABLE) ×1 IMPLANT
GOWN STRL REUS W/ TWL XL LVL3 (GOWN DISPOSABLE) ×1 IMPLANT
GOWN STRL REUS W/TWL LRG LVL3 (GOWN DISPOSABLE) ×1
GOWN STRL REUS W/TWL XL LVL3 (GOWN DISPOSABLE) ×1
HOLDER FOLEY CATH W/STRAP (MISCELLANEOUS) ×1 IMPLANT
HOOD PEEL AWAY FLYTE STAYCOOL (MISCELLANEOUS) ×7 IMPLANT
INSERT TIB BEARING 78X12 (Insert) ×1 IMPLANT
KIT TURNOVER KIT A (KITS) ×2 IMPLANT
NDL SAFETY ECLIPSE 18X1.5 (NEEDLE) ×2 IMPLANT
NDL SPNL 20GX3.5 QUINCKE YW (NEEDLE) ×1 IMPLANT
NEEDLE HYPO 18GX1.5 SHARP (NEEDLE) ×2
NEEDLE SPNL 20GX3.5 QUINCKE YW (NEEDLE) ×2 IMPLANT
NS IRRIG 1000ML POUR BTL (IV SOLUTION) ×2 IMPLANT
PACK TOTAL KNEE (MISCELLANEOUS) ×2 IMPLANT
PAD WRAPON POLAR KNEE (MISCELLANEOUS) ×1 IMPLANT
PEG PATELLA SERIES A 37MMX10MM (Orthopedic Implant) ×1 IMPLANT
PLATE KNEE TIBIAL 79MM FIXED (Plate) ×1 IMPLANT
PULSAVAC PLUS IRRIG FAN TIP (DISPOSABLE) ×2
SOL .9 NS 3000ML IRR  AL (IV SOLUTION) ×1
SOL .9 NS 3000ML IRR UROMATIC (IV SOLUTION) ×1 IMPLANT
STAPLER SKIN PROX 35W (STAPLE) ×2 IMPLANT
SUCTION FRAZIER HANDLE 10FR (MISCELLANEOUS) ×1
SUCTION TUBE FRAZIER 10FR DISP (MISCELLANEOUS) ×1 IMPLANT
SUT VIC AB 0 CT1 36 (SUTURE) ×6 IMPLANT
SUT VIC AB 2-0 CT1 27 (SUTURE) ×3
SUT VIC AB 2-0 CT1 TAPERPNT 27 (SUTURE) ×3 IMPLANT
SYR 10ML LL (SYRINGE) ×2 IMPLANT
SYR 20ML LL LF (SYRINGE) ×2 IMPLANT
SYR 30ML LL (SYRINGE) ×6 IMPLANT
TIP FAN IRRIG PULSAVAC PLUS (DISPOSABLE) ×1 IMPLANT
TRAY FOLEY MTR SLVR 16FR STAT (SET/KITS/TRAYS/PACK) ×1 IMPLANT
WRAPON POLAR PAD KNEE (MISCELLANEOUS) ×2

## 2019-08-13 NOTE — Discharge Instructions (Signed)

## 2019-08-13 NOTE — Anesthesia Preprocedure Evaluation (Addendum)
Anesthesia Evaluation  Patient identified by MRN, date of birth, ID band Patient awake    Reviewed: Allergy & Precautions, H&P , NPO status , Patient's Chart, lab work & pertinent test results  History of Anesthesia Complications (+) PONV and history of anesthetic complications  Airway Mallampati: II  TM Distance: <3 FB Neck ROM: full    Dental  (+) Partial Upper   Pulmonary neg shortness of breath, neg COPD, neg recent URI,    breath sounds clear to auscultation       Cardiovascular (-) hypertension(-) anginanegative cardio ROS  (-) dysrhythmias  Rhythm:regular Rate:Normal     Neuro/Psych  Headaches, PSYCHIATRIC DISORDERS Depression Peripheral neuropathy    GI/Hepatic Neg liver ROS, GERD  ,  Endo/Other  negative endocrine ROS  Renal/GU      Musculoskeletal   Abdominal   Peds  Hematology  (+) Blood dyscrasia, anemia , Hgb 11.5   Anesthesia Other Findings Past Medical History: No date: Anemia     Comment:  " when I was young and when I was pregnant " No date: Cancer ([place])     Comment:  Basal Cell Carcinoma face No date: DDD (degenerative disc disease), lumbar No date: Depression No date: Environmental allergies No date: GERD (gastroesophageal reflux disease) No date: Headache No date: Hyperlipidemia No date: Insomnia No date: Mild atherosclerosis of both carotid arteries No date: Peripheral neuropathy No date: PONV (postoperative nausea and vomiting) No date: Shortness of breath dyspnea     Comment:  with exertion No date: Spinal cord tumor     Comment:  lumbar No date: Syncope No date: Urge incontinence No date: Wears contact lenses  Past Surgical History: No date: ABDOMINAL HYSTERECTOMY No date: CHOLECYSTECTOMY No date: COLONOSCOPY W/ BIOPSIES AND POLYPECTOMY     Comment:  x3 09/02/2017: COLONOSCOPY WITH PROPOFOL; N/A     Comment:  Procedure: COLONOSCOPY WITH PROPOFOL;  Surgeon: Manya Silvas, MD;  Location: [facility] ENDOSCOPY;  Service:               Endoscopy;  Laterality: N/A; No date: DILATION AND CURETTAGE OF UTERUS 09/02/2017: ESOPHAGOGASTRODUODENOSCOPY; N/A     Comment:  Procedure: ESOPHAGOGASTRODUODENOSCOPY (EGD);  Surgeon:               Manya Silvas, MD;  Location: [facility] ENDOSCOPY;                Service: Endoscopy;  Laterality: N/A; 11/03/2015: LAMINECTOMY; N/A     Comment:  Procedure: L1-2 Laminectomy for resection of intradural               tumor;  Surgeon: Newman Pies, MD;  Location: [place] NEURO              ORS;  Service: Neurosurgery;  Laterality: N/A;  L1-2               Laminectomy for resection of intradural tumor 06/06/2017: LEFT HEART CATH AND CORONARY ANGIOGRAPHY; Left     Comment:  Procedure: LEFT HEART CATH AND CORONARY ANGIOGRAPHY;                Surgeon: Isaias Cowman, MD;  Location: [place] CV LAB;  Service: Cardiovascular;  Laterality:               Left; No date: TONSILLECTOMY  BMI    Body Mass Index: 28.80 kg/m  Reproductive/Obstetrics negative OB ROS                            Anesthesia Physical Anesthesia Plan  ASA: II  Anesthesia Plan: Spinal   Post-op Pain Management:    Induction:   PONV Risk Score and Plan: Propofol infusion  Airway Management Planned: Natural Airway and Simple Face Mask  Additional Equipment:   Intra-op Plan:   Post-operative Plan:   Informed Consent: I have reviewed the patients History and Physical, chart, labs and discussed the procedure including the risks, benefits and alternatives for the proposed anesthesia with the patient or authorized representative who has indicated his/her understanding and acceptance.     Dental Advisory Given  Plan Discussed with: Anesthesiologist  Anesthesia Plan Comments:         Anesthesia Quick Evaluation

## 2019-08-13 NOTE — Progress Notes (Signed)
Upon arrival to PACU   BLADDER scan 31ml    Discharge to floor bladder scan 227

## 2019-08-13 NOTE — Discharge Summary (Signed)
Physician Discharge Summary  Patient ID: Heidi Cross MRN: [id number] DOB/AGE: 10-21-1940 79 y.o.  Admit date: 08/13/2019 Discharge date: 08/16/2019  Admission Diagnoses:  Status post total knee replacement using cement, right [Z96.651]  Discharge Diagnoses: Patient Active Problem List   Diagnosis Date Noted  . Status post total knee replacement using cement, right 08/13/2019  . Lumbar spine tumor 11/03/2015   Past Medical History:  Diagnosis Date  . Anemia    " when I was young and when I was pregnant "  . Cancer ([place])    Basal Cell Carcinoma face  . DDD (degenerative disc disease), lumbar   . Depression   . Environmental allergies   . GERD (gastroesophageal reflux disease)   . Headache   . Hyperlipidemia   . Insomnia   . Mild atherosclerosis of both carotid arteries   . Peripheral neuropathy   . PONV (postoperative nausea and vomiting)   . Shortness of breath dyspnea    with exertion  . Spinal cord tumor    lumbar  . Syncope   . Urge incontinence   . Wears contact lenses      Transfusion: None.   Consultants (if any):   Discharged Condition: Improved  Hospital Course: CHANNIE LADD is an 79 y.o. female who was admitted 08/13/2019 with a diagnosis of primary osteoarthritis of the right knee and went to the operating room on 08/13/2019 and underwent the above named procedures.    Surgeries: Procedure(s): RIGHT TOTAL KNEE ARTHROPLASTY on 08/13/2019 Patient tolerated the surgery well. Taken to PACU where she was stabilized and then transferred to the orthopedic floor.  Started on Lovenox 40mg  q 24 hrs. Foot pumps applied bilaterally at 80 mm. Heels elevated on bed with rolled towels. No evidence of DVT. Negative Homan. Physical therapy started on day #1 for gait training and transfer. OT started day #1 for ADL and assisted devices.  Patient's IV was removed on POD2.  Patient continued to work wit Physical therapy and made progress. She was discharged on  POD#3  Implants: Right TKA using all-cemented [employer] Vanguard system with a 72.5 mm PCR femur, a 79 mm tibial tray with a 12 mm anterior stabilized E-poly insert, and a 37 x 10 mm all-poly 3-pegged domed patella.  She was given perioperative antibiotics:  Anti-infectives (From admission, onward)   Start     Dose/Rate Route Frequency Ordered Stop   08/13/19 1145  ceFAZolin (ANCEF) IVPB 2g/100 mL premix     2 g 200 mL/hr over 30 Minutes Intravenous Every 6 hours 08/13/19 1140 08/14/19 0123   08/13/19 0615  ceFAZolin (ANCEF) IVPB 2g/100 mL premix     2 g 200 mL/hr over 30 Minutes Intravenous On call to O.R. 08/13/19 [id number] 08/13/19 0744    .  She was given sequential compression devices, early ambulation, and Lovenox for DVT prophylaxis.  She benefited maximally from the hospital stay and there were no complications.    Recent vital signs:  Vitals:   08/15/19 2211 08/16/19 0112  BP: 137/66 134/67  Pulse: 85 90  Resp: 16 16  Temp: 98.4 F (36.9 C) 98.6 F (37 C)  SpO2: 100% 100%    Recent laboratory studies:  Lab Results  Component Value Date   HGB 10.1 (L) 08/15/2019   HGB 10.1 (L) 08/14/2019   HGB 11.5 (L) 08/07/2019   Lab Results  Component Value Date   WBC 7.7 08/15/2019   PLT 234 08/15/2019   No results found for: INR Lab  Results  Component Value Date   NA 130 (L) 08/16/2019   K 4.1 08/16/2019   CL 98 08/16/2019   CO2 25 08/16/2019   BUN 12 08/16/2019   CREATININE 0.59 08/16/2019   GLUCOSE 122 (H) 08/16/2019    Discharge Medications:   Allergies as of 08/16/2019   No Known Allergies     Medication List    STOP taking these medications   aspirin EC 81 MG tablet     TAKE these medications   acetaminophen 500 MG tablet Commonly known as: TYLENOL Take 1,000 mg by mouth every 6 (six) hours as needed.   buPROPion 150 MG 24 hr tablet Commonly known as: WELLBUTRIN XL Take 150 mg by mouth daily.   Calcium 500 + D 500-125 MG-UNIT Tabs Generic drug:  Calcium Carbonate-Vitamin D Take 1 tablet by mouth every other day.   cyclobenzaprine 10 MG tablet Commonly known as: FLEXERIL Take 10 mg by mouth 3 (three) times daily as needed for muscle spasms.   enoxaparin 40 MG/0.4ML injection Commonly known as: LOVENOX Inject 0.4 mLs (40 mg total) into the skin daily.   famotidine 40 MG tablet Commonly known as: PEPCID Take 40 mg by mouth at bedtime.   fexofenadine 180 MG tablet Commonly known as: ALLEGRA Take 180 mg by mouth daily.   fluticasone 50 MCG/ACT nasal spray Commonly known as: FLONASE Place 1 spray into both nostrils daily as needed for allergies or rhinitis.   gabapentin 300 MG capsule Commonly known as: NEURONTIN Take 300-600 mg by mouth at bedtime.   Glucosamine-Chondroitin 500-400 MG Caps Take 1 tablet by mouth daily.   oxyCODONE 5 MG immediate release tablet Commonly known as: Oxy IR/ROXICODONE Take 1-2 tablets (5-10 mg total) by mouth every 4 (four) hours as needed for moderate pain (pain score 4-6).   pantoprazole 40 MG tablet Commonly known as: PROTONIX Take 40 mg by mouth daily.   QC Womens Daily Multivitamin Tabs Take 1 tablet by mouth daily.   sucralfate 1 g tablet Commonly known as: CARAFATE Take 1 g by mouth 4 (four) times daily -  with meals and at bedtime.   traMADol 50 MG tablet Commonly known as: ULTRAM Take 1 tablet (50 mg total) by mouth every 6 (six) hours as needed for moderate pain. What changed: when to take this   traZODone 50 MG tablet Commonly known as: DESYREL Take 50 mg by mouth at bedtime.            Durable Medical Equipment  (From admission, onward)         Start     Ordered   08/13/19 1141  DME Bedside commode  Once    Question:  Patient needs a bedside commode to treat with the following condition  Answer:  Status post total knee replacement using cement, right   08/13/19 1140   08/13/19 1141  DME 3 n 1  Once     08/13/19 1140   08/13/19 1141  DME Walker rolling   Once    Question Answer Comment  Walker: With 5 Inch Wheels   Patient needs a walker to treat with the following condition Status post total knee replacement using cement, right      08/13/19 1140          Diagnostic Studies: DG Knee Right Port  Result Date: 08/13/2019 CLINICAL DATA:  Postop knee replacement EXAM: PORTABLE RIGHT KNEE - 1-2 VIEW COMPARISON:  02/28/2014 FINDINGS: Total knee replacement in satisfactory position and alignment.  No fracture or complication. Gas and fluid in the knee joint. IMPRESSION: Satisfactory knee replacement. Electronically Signed   By: Franchot Gallo M.D.   On: 08/13/2019 10:10   Disposition: home with home health PT   Follow-up Information    Lattie Corns, PA-C Follow up in 14 day(s).   Specialty: Physician Assistant Why: Electa Sniff information: [place] [address] [phone]          Signed: Fausto Skillern PA-C 08/16/2019, 12:48 PM

## 2019-08-13 NOTE — Transfer of Care (Signed)
Immediate Anesthesia Transfer of Care Note  Patient: Heidi Cross  Procedure(s) Performed: RIGHT TOTAL KNEE ARTHROPLASTY (Right Knee)  Patient Location: PACU  Anesthesia Type:Spinal  Level of Consciousness: awake and oriented  Airway & Oxygen Therapy: Patient Spontanous Breathing and Patient connected to face mask oxygen  Post-op Assessment: Report given to RN and Post -op Vital signs reviewed and stable  Post vital signs: Reviewed and stable  Last Vitals:  Vitals Value Taken Time  BP 105/38 08/13/19 0946  Temp    Pulse 79 08/13/19 0949  Resp 15 08/13/19 0949  SpO2 96 % 08/13/19 0949  Vitals shown include unvalidated device data.  Last Pain:  Vitals:   08/13/19 0652  TempSrc: Temporal  PainSc: 5          Complications: No apparent anesthesia complications

## 2019-08-13 NOTE — Anesthesia Procedure Notes (Signed)
Spinal  Patient location during procedure: OR Start time: 08/13/2019 7:33 AM End time: 08/13/2019 7:38 AM Staffing Performed: resident/CRNA  Resident/CRNA: Justus Memory, CRNA Preanesthetic Checklist Completed: patient identified, IV checked, site marked, risks and benefits discussed, surgical consent, monitors and equipment checked, pre-op evaluation and timeout performed Spinal Block Patient position: sitting Prep: Betadine Patient monitoring: heart rate, continuous pulse ox, blood pressure and cardiac monitor Approach: midline Location: L3-4 Injection technique: single-shot Needle Needle type: Introducer and Pencan  Needle gauge: 25 G Needle length: 9 cm Assessment Sensory level: T6 Additional Notes Negative paresthesia. Negative blood return. Positive free-flowing CSF. Expiration date of kit checked and confirmed. Patient tolerated procedure well, without complications.

## 2019-08-13 NOTE — Evaluation (Signed)
Physical Therapy Evaluation Patient Details Name: Heidi Cross MRN: [id number] DOB: 29-Apr-1941 Today's Date: 08/13/2019   History of Present Illness  Per MD notes: Pt is a 79 y/o F with degenerative joint disease of the R knee and is s/p elective R TKA. PMH includes BCC, DDD, depression, GERD, HLD, and peripheral neuropathy.   Clinical Impression  Pt pleasant and motivated to participate during the session. Pt found on RA and SpO2 and HR were WNL t/o session. Pt did not require physical assistance with bed mobility, sit to/from stand transfers, or when ambulating 3 feet from the EOB to the recliner; however, pt did display increased fatigue after ambulating with poor eccentric control with sitting down along with generally being a little bit unsteady with amb. Pt appeared slightly apprehensive to WB through the RLE initially but increased weight-bearing as she continued to ambulate. Pt also had a brief moment of post instability in static standing but did not require physical assistance to regain balance. Pt will benefit from HHPT services upon discharge to safely address deficits listed in patient problem list for decreased caregiver assistance and eventual return to PLOF.       Follow Up Recommendations Home health PT    Equipment Recommendations  Rolling walker with 5" wheels;3in1 (PT)    Recommendations for Other Services       Precautions / Restrictions Precautions Precautions: Knee Precaution Booklet Issued: Yes (comment) Restrictions Weight Bearing Restrictions: Yes RLE Weight Bearing: Weight bearing as tolerated      Mobility  Bed Mobility Overal bed mobility: Needs Assistance Bed Mobility: Supine to Sit     Supine to sit: Supervision     General bed mobility comments: Min cueing/reassurance with movement pattern, management of IV line. Extra time and effort needs but not physical assistance required  Transfers Overall transfer level: Needs assistance Equipment  used: Rolling walker (2 wheeled) Transfers: Sit to/from Stand Sit to Stand: Min guard         General transfer comment: Pt able to complete without physical assitance but required cueing to WB through the RLE and to stand-up straight. On first stand-to sit attempt, pt demonstarted good eccentric control but on second attempt after ambulating, pt appeared fatigued and had poor eccentric control when returning to a seated positon  Ambulation/Gait Ambulation/Gait assistance: Min guard Gait Distance (Feet): 3 Feet Assistive device: Rolling walker (2 wheeled) Gait Pattern/deviations: Step-to pattern;Decreased step length - left;Decreased stance time - right;Decreased weight shift to right;Antalgic Gait velocity: decreased   General Gait Details: Small steps, cueing required for LE and RW sequencing, decreased stance time on the RLE resulted in decreased step length on the left. Mod BUE assist through RW in R stance, min in double leg stance or L stance.  Stairs            Wheelchair Mobility    Modified Rankin (Stroke Patients Only)       Balance Overall balance assessment: Needs assistance Sitting-balance support: No upper extremity supported;Feet unsupported Sitting balance-Leahy Scale: Fair Sitting balance - Comments: Able to maintain sitting balance without UE within BOS   Standing balance support: Bilateral upper extremity supported;During functional activity Standing balance-Leahy Scale: Fair Standing balance comment: Slight post LOB in standing and min-mod BUE through the RW during functional activity                             Pertinent Vitals/Pain Pain Assessment: 0-10 Pain Score: 3 (  3/10 at rest, 5-6/10 with amb) Pain Location: R knee Pain Descriptors / Indicators: Aching;Sore Pain Intervention(s): Monitored during session;Premedicated before session;Ice applied    Home Living Family/patient expects to be discharged to:: Private residence Living  Arrangements: Spouse/significant other;Children Available Help at Discharge: Family;Available 24 hours/day Type of Home: House Home Access: Stairs to enter;Ramped entrance Entrance Stairs-Rails: Right;Left;Can reach both Entrance Stairs-Number of Steps: 2 Home Layout: One level Home Equipment: Grab bars - toilet;Walker - 4 wheels;Grab bars - tub/shower;Cane - single point;Other (comment)(Pt uses a rolling chair in the kitchen with ADL's and IADL's) Additional Comments: Pt has several ways to enter her house. Up a ramp installed by her husband that is "a little steep", up 2 tall steps in the garage with bilat handrails, or around the back of the house through the grass up 3 wide steps with bilat handrails.    Prior Function Level of Independence: Needs assistance   Gait / Transfers Assistance Needed: In home, pt uses 4WW, in public, pt uses a SPC and handheld assist  ADL's / Homemaking Assistance Needed: Ind with ADL's, uses rolling chair for most kitchen IADL's or light household chores. Has someone come to her home biweekly for heavy chores (i.e. vacuuming)  Comments: denies falls     Hand Dominance        Extremity/Trunk Assessment        Lower Extremity Assessment Lower Extremity Assessment: Generalized weakness;RLE deficits/detail RLE: Unable to fully assess due to pain RLE Sensation: WNL       Communication   Communication: No difficulties  Cognition Arousal/Alertness: Awake/alert Behavior During Therapy: WFL for tasks assessed/performed Overall Cognitive Status: Within Functional Limits for tasks assessed                                        General Comments      Exercises Total Joint Exercises Ankle Circles/Pumps: AROM;Strengthening;Both;20 reps Quad Sets: Strengthening;Both;10 reps Gluteal Sets: Strengthening;Both;10 reps Long Arc Quad: AROM;Strengthening;Both;10 reps Knee Flexion: AROM;Strengthening;Both;10 reps Goniometric ROM: R knee  AROM: 10-74 deg Marching in Standing: AROM;Strengthening;Both;10 reps Other Exercises Other Exercises: HEP education Other Exercises: RW training/sequencing Other Exercises: sit to/from stand transfer sequencing   Assessment/Plan    PT Assessment Patient needs continued PT services  PT Problem List Decreased strength;Decreased mobility;Decreased range of motion;Decreased knowledge of precautions;Decreased activity tolerance;Decreased balance;Decreased knowledge of use of DME;Pain       PT Treatment Interventions DME instruction;Therapeutic exercise;Gait training;Balance training;Stair training;Functional mobility training;Therapeutic activities;Patient/family education    PT Goals (Current goals can be found in the Care Plan section)  Acute Rehab PT Goals Patient Stated Goal: "get up and moving" PT Goal Formulation: With patient Time For Goal Achievement: 08/26/19 Potential to Achieve Goals: Good    Frequency BID   Barriers to discharge        Co-evaluation               AM-PAC PT "6 Clicks" Mobility  Outcome Measure Help needed turning from your back to your side while in a flat bed without using bedrails?: A Little Help needed moving from lying on your back to sitting on the side of a flat bed without using bedrails?: A Little Help needed moving to and from a bed to a chair (including a wheelchair)?: A Little Help needed standing up from a chair using your arms (e.g., wheelchair or bedside chair)?: A Little Help  needed to walk in hospital room?: A Lot Help needed climbing 3-5 steps with a railing? : A Lot 6 Click Score: 16    End of Session Equipment Utilized During Treatment: Gait belt Activity Tolerance: Patient tolerated treatment well Patient left: in chair;with chair alarm set;with family/visitor present;with SCD's reapplied Nurse Communication: Mobility status;Precautions;Weight bearing status;Other (comment)(call button not lit up) PT Visit Diagnosis:  Unsteadiness on feet (R26.81);Other abnormalities of gait and mobility (R26.89);Muscle weakness (generalized) (M62.81);Pain Pain - Right/Left: Right Pain - part of body: Knee    Time: [id number] PT Time Calculation (min) (ACUTE ONLY): 59 min   Charges:              Annabelle Harman, SPT 08/13/19 4:53 PM

## 2019-08-13 NOTE — Progress Notes (Addendum)
PHARMACIST - PHYSICIAN ORDER COMMUNICATION  CONCERNING: P&T Medication Policy on Herbal Medications  DESCRIPTION:  This patient's order for:  Glucosamine/chrondroitin  has been noted.  This product(s) is classified as an "herbal" or natural product. Due to a lack of definitive safety studies or FDA approval, nonstandard manufacturing practices, plus the potential risk of unknown drug-drug interactions while on inpatient medications, the Pharmacy and Therapeutics Committee does not permit the use of "herbal" or natural products of this type within [facility].   ACTION TAKEN: The pharmacy department is unable to verify this order at this time and your patient has been informed of this safety policy. Please reevaluate patient's clinical condition at discharge and address if the herbal or natural product(s) should be resumed at that time.  Lu Duffel, PharmD, BCPS Clinical Pharmacist 08/13/2019 11:46 AM

## 2019-08-13 NOTE — H&P (Signed)
Paper H&P to be scanned into permanent record. H&P reviewed and patient re-examined. No changes. 

## 2019-08-13 NOTE — Op Note (Signed)
08/13/2019  9:52 AM  Patient:   Heidi Cross  Pre-Op Diagnosis:   Degenerative joint disease, right knee.  Post-Op Diagnosis:   Same  Procedure:   Right TKA using all-cemented [employer] Vanguard system with a 72.5 mm PCR femur, a 79 mm tibial tray with a 12 mm anterior stabilized E-poly insert, and a 37 x 10 mm all-poly 3-pegged domed patella.  Surgeon:   Pascal Lux, MD  Assistant:   Cameron Proud, PA-C   Anesthesia:   Spinal  Findings:   As above  Complications:   None  EBL:   10 cc  Fluids:   800 cc crystalloid  UOP:   None  TT:   80 minutes at 300 mmHg  Drains:   None  Closure:   Staples  Implants:   As above  Brief Clinical Note:   The patient is a 79 year old female with a long history of progressively worsening right knee pain. The patient's symptoms have progressed despite medications, activity modification, injections, etc. The patient's history and examination were consistent with advanced degenerative joint disease of the right knee confirmed by plain radiographs. The patient presents at this time for a right total knee arthroplasty.  Procedure:   The patient was brought into the operating room. After adequate spinal anesthesia was obtained, the patient was lain in the supine position. A Foley catheter was placed by the nurse before the right lower extremity was prepped with ChloraPrep solution and draped sterilely. Preoperative antibiotics were administered. After verifying the proper laterality with a surgical timeout, the limb was exsanguinated with an Esmarch and the tourniquet inflated to 300 mmHg. A standard anterior approach to the knee was made through an approximately 7 inch incision. The incision was carried down through the subcutaneous tissues to expose superficial retinaculum. This was split the length of the incision and the medial flap elevated sufficiently to expose the medial retinaculum. The medial retinaculum was incised, leaving a 3-4 mm cuff of  tissue on the patella. This was extended distally along the medial border of the patellar tendon and proximally through the medial third of the quadriceps tendon. A subtotal fat pad excision was performed before the soft tissues were elevated off the anteromedial and anterolateral aspects of the proximal tibia to the level of the collateral ligaments. The anterior portions of the medial and lateral menisci were removed, as was the anterior cruciate ligament. With the knee flexed to 90, the external tibial guide was positioned and the appropriate proximal tibial cut made. This piece was taken to the back table where it was measured and found to be optimally replicated by a 79 mm component.  Attention was directed to the distal femur. The intramedullary canal was accessed through a 3/8" drill hole. The intramedullary guide was inserted and positioned in order to obtain a neutral flexion gap. The intercondylar block was positioned with care taken to avoid notching the anterior cortex of the femur. The appropriate cut was made. Next, the distal cutting block was placed at 6 of valgus alignment. Using the 9 mm slot, the distal cut was made. The distal femur was measured and found to be optimally replicated by the A999333 mm component. The 72.5 mm 4-in-1 cutting block was positioned and first the posterior, then the posterior chamfer, the anterior chamfer, and finally the anterior cuts were made. At this point, the posterior portions medial and lateral menisci were removed. A trial reduction was performed using the appropriate femoral and tibial components with first  the 10 mm and then the 12 mm insert. The 12 mm insert demonstrated excellent stability to varus and valgus stressing both in flexion and extension while permitting full extension. Patella tracking was assessed and found to be excellent. Therefore, the tibial guide position was marked on the proximal tibia. The patella thickness was measured and found to be  22 mm. Therefore, the appropriate cut was made. The patellar surface was measured and found to be optimally replicated by the 37 mm component. The three peg holes were drilled in place before the trial button was inserted. Patella tracking was assessed and found to be excellent, passing the "no thumb test". The lug holes were drilled into the distal femur before the trial component was removed, leaving only the tibial tray. The keel was then created using the appropriate tower, reamer, and punch.  The bony surfaces were prepared for cementing by irrigating them thoroughly with bacitracin saline solution via the jet lavage system. A bone plug was fashioned from some of the bone that had been removed previously and used to plug the distal femoral canal. In addition, 20 cc of Exparel diluted out to 60 cc with normal saline and 30 cc of 0.5% Sensorcaine were injected into the postero-medial and postero-lateral aspects of the knee, the medial and lateral gutter regions, and the peri-incisional tissues to help with postoperative analgesia. Meanwhile, the cement was being mixed on the back table. When it was ready, the tibial tray was cemented in first. The excess cement was removed using [employer] fast streamer. Next, the femoral component was impacted into place. Again, the excess cement was removed using [employer] fast streamer. The 12 mm trial insert was positioned and the knee brought into extension while the cement hardened. Finally, the patella was cemented into place and secured using the patellar clamp. Again, the excess cement was removed using [employer] fast streamer. Once the cement had hardened, the knee was placed through a range of motion with the findings as described above. Therefore, the trial insert was removed and, after verifying that no cement had been retained posteriorly, the permanent 12 mm anterior stabilized E-polyethylene insert was positioned and secured using the appropriate key locking mechanism. Again the knee  was placed through a range of motion with the findings as described above.  The wound was copiously irrigated with sterile saline solution using the jet lavage system before the quadriceps tendon and retinacular layer were reapproximated using #0 Vicryl interrupted sutures. The superficial retinacular layer also was closed using a running #0 Vicryl suture. A total of 10 cc of transexemic acid (TXA) was injected intra-articularly before the subcutaneous tissues were closed in several layers using 2-0 Vicryl interrupted sutures. The skin was closed using staples. A sterile honeycomb dressing was applied to the skin before the leg was wrapped with an Ace wrap to accommodate the Polar Care device. The patient was then awakened and returned to the recovery room in satisfactory condition after tolerating the procedure well.

## 2019-08-13 NOTE — TOC Initial Note (Signed)
Transition of Care [facility]) - Initial/Assessment Note    Patient Details  Name: Heidi Cross MRN: [id number] Date of Birth: 04/17/1941  Transition of [facility]) CM/SW Contact:    Elease Hashimoto, LCSW Phone Number: 08/13/2019, 2:45 PM  Clinical Narrative:   Met with pt and husband who is here by the bedside, to discuss discharge needs. Pt has a rw but will need a 3 in 1 before going home. Pt was independent prior to admission and her husband is in good health and can assist if needed. They have grown children who are involved and supportive. Aware [facility] will be providing home health services at discharge. See tomorrow to see how doing and follow while here for discharge needs. Husband drives and pt was prior to admission. Pt has PCP and insurance.             Expected Discharge Plan: [place] Barriers to Discharge: Continued Medical Work up   Patient Goals and CMS Choice Patient states their goals for this hospitalization and ongoing recovery are:: To go home and be doing better      Expected Discharge Plan and Services Expected Discharge Plan: [place] In-house Referral: Clinical Social Work   Post Acute Care Choice: Museum/gallery conservator, Home Health Living arrangements for the past 2 months: [place]: PT [place]: [place] (now [facility]) Date [place]: 08/13/19 Time [place]: [place] Representative spoke with at [place]: teresa  Prior Living Arrangements/Services Living arrangements for the past 2 months: [place] with:: Spouse          Need for Family Participation in Patient Care: No (Comment) Care giver support system in place?: Yes (comment) Current home services: DME(has rw)    Activities of Daily Living Home Assistive Devices/Equipment: Contact lenses, Eyeglasses, Dentures (specify type), Cane (specify quad or  straight), Walker (specify type) ADL Screening (condition at time of admission) Patient's cognitive ability adequate to safely complete daily activities?: Yes Is the patient deaf or have difficulty hearing?: No Does the patient have difficulty seeing, even when wearing glasses/contacts?: No Does the patient have difficulty concentrating, remembering, or making decisions?: No Patient able to express need for assistance with ADLs?: Yes Does the patient have difficulty dressing or bathing?: No Independently performs ADLs?: Yes (appropriate for developmental age) Does the patient have difficulty walking or climbing stairs?: Yes Weakness of Legs: Right Weakness of Arms/Hands: None  Permission Sought/Granted   Permission granted to share information with : Yes, Verbal Permission Granted  Share Information with NAME: Helene Kelp  Permission granted to share info w AGENCY: [facility]     Permission granted to share info w Contact Information: Husband  Emotional Assessment Appearance:: Appears stated age Attitude/Demeanor/Rapport: Engaged Affect (typically observed): Adaptable, Accepting Orientation: : Oriented to Self, Oriented to Place, Oriented to  Time, Oriented to Situation Alcohol / Substance Use: Never Used Psych Involvement: No (comment)  Admission diagnosis:  Status post total knee replacement using cement, right [Z96.651] Patient Active Problem List   Diagnosis Date Noted  . Status post total knee replacement using cement, right 08/13/2019  . Lumbar spine tumor 11/03/2015   PCP:  Rusty Aus, MD Pharmacy:   [id number] [place] [place] [id number]  [place] [address] Phone: [phone] Fax: [phone]     Social Determinants of Health (SDOH) Interventions    Readmission Risk Interventions No flowsheet data found.

## 2019-08-14 LAB — BASIC METABOLIC PANEL
Anion gap: 8 (ref 5–15)
BUN: 10 mg/dL (ref 8–23)
CO2: 25 mmol/L (ref 22–32)
Calcium: 8.6 mg/dL — ABNORMAL LOW (ref 8.9–10.3)
Chloride: 103 mmol/L (ref 98–111)
Creatinine, Ser: 0.71 mg/dL (ref 0.44–1.00)
GFR calc Af Amer: >60 mL/min (ref >60)
GFR calc non Af Amer: >60 mL/min (ref >60)
Glucose, Bld: 117 mg/dL — ABNORMAL HIGH (ref 70–99)
Potassium: 3.8 mmol/L (ref 3.5–5.1)
Sodium: 136 mmol/L (ref 135–145)

## 2019-08-14 LAB — CBC
HCT: 31 % — ABNORMAL LOW (ref 36.0–46.0)
Hemoglobin: 10.1 g/dL — ABNORMAL LOW (ref 12.0–15.0)
MCH: 31.7 pg (ref 26.0–34.0)
MCHC: 32.6 g/dL (ref 30.0–36.0)
MCV: 97.2 fL (ref 80.0–100.0)
Platelets: 228 10*3/uL (ref 150–400)
RBC: 3.19 MIL/uL — ABNORMAL LOW (ref 3.87–5.11)
RDW: 12.3 % (ref 11.5–15.5)
WBC: 8.7 10*3/uL (ref 4.0–10.5)
nRBC: 0 % (ref 0.0–0.2)

## 2019-08-14 NOTE — Progress Notes (Signed)
  Subjective: 1 Day Post-Op Procedure(s) (LRB): RIGHT TOTAL KNEE ARTHROPLASTY (Right) Patient reports pain as 5 on 0-10 scale.   Patient is well, and has had no acute complaints or problems Plan is to go Home after hospital stay. Negative for chest pain and shortness of breath Fever: no Gastrointestinal:Negative for nausea and vomiting Used CPM last night.  Objective: Vital signs in last 24 hours: Temp:  [97.1 F (36.2 C)-98.4 F (36.9 C)] 98.4 F (36.9 C) (03/25 2318) Pulse Rate:  [72-79] 75 (03/25 2318) Resp:  [9-17] 16 (03/25 2318) BP: (105-137)/(38-75) 137/73 (03/25 2318) SpO2:  [93 %-99 %] 96 % (03/25 2318)  Intake/Output from previous day:  Intake/Output Summary (Last 24 hours) at 08/14/2019 0729 Last data filed at 08/14/2019 0356 Gross per 24 hour  Intake 1328.38 ml  Output 1210 ml  Net 118.38 ml    Intake/Output this shift: No intake/output data recorded.  Labs: Recent Labs    08/14/19 0454  HGB 10.1*   Recent Labs    08/14/19 0454  WBC 8.7  RBC 3.19*  HCT 31.0*  PLT 228   Recent Labs    08/14/19 0454  NA 136  K 3.8  CL 103  CO2 25  BUN 10  CREATININE 0.71  GLUCOSE 117*  CALCIUM 8.6*   No results for input(s): LABPT, INR in the last 72 hours.   EXAM General - Patient is Alert, Appropriate and Oriented Extremity - ABD soft Neurovascular intact Sensation intact distally Intact pulses distally Dorsiflexion/Plantar flexion intact Incision: scant drainage No cellulitis present Dressing/Incision - blood tinged drainage Motor Function - intact, moving foot and toes well on exam.  Abdomen soft on palpation, intact bowel sounds.  Past Medical History:  Diagnosis Date  . Anemia    " when I was young and when I was pregnant "  . Cancer ([place])    Basal Cell Carcinoma face  . DDD (degenerative disc disease), lumbar   . Depression   . Environmental allergies   . GERD (gastroesophageal reflux disease)   . Headache   . Hyperlipidemia   .  Insomnia   . Mild atherosclerosis of both carotid arteries   . Peripheral neuropathy   . PONV (postoperative nausea and vomiting)   . Shortness of breath dyspnea    with exertion  . Spinal cord tumor    lumbar  . Syncope   . Urge incontinence   . Wears contact lenses     Assessment/Plan: 1 Day Post-Op Procedure(s) (LRB): RIGHT TOTAL KNEE ARTHROPLASTY (Right) Active Problems:   Status post total knee replacement using cement, right  Estimated body mass index is 28.8 kg/m as calculated from the following:   Height as of this encounter: 5\' 9"  (1.753 m).   Weight as of this encounter: 88.5 kg. Advance diet Up with therapy D/C IV fluids when tolerating po intake.  Labs reviewed this AM.  CBC and BMP ordered for tomorrow morning. Hg 10.1. Up with therapy today. Begin working on BM. Plan for possible d/c home tomorrow pending progress with PT today.  DVT Prophylaxis - Lovenox, Foot Pumps and TED hose Weight-Bearing as tolerated to right leg  J. Cameron Proud, PA-C [facility] Orthopaedic Surgery 08/14/2019, 7:29 AM

## 2019-08-14 NOTE — TOC Progression Note (Signed)
Transition of Care [facility]) - Progression Note    Patient Details  Name: Heidi Cross MRN: [id number] Date of Birth: 05/07/1941  [facility] Care [facility]) CM/SW Contact  Helton Oleson, Gardiner Rhyme, LCSW Phone Number: 08/14/2019, 12:04 PM  Clinical Narrative:  Have delivered 3 in 1 to pt;s room she has rolling walker. She reports has walked to the door but is having much pain with this, but hopeful this will get better.    Expected Discharge Plan: [place] Barriers to Discharge: Continued Medical Work up  Expected Discharge Plan and Services Expected Discharge Plan: [place] In-house Referral: Clinical Social Work   Post Acute Care Choice: Museum/gallery conservator, Home Health Living arrangements for the past 2 months: [place]: PT [place]: [facility] ([facility]) Date [place]: 08/13/19 Time [place]: 1444 Representative spoke with at [place]: teresa   Social Determinants of Health ([place]) Interventions    Readmission Risk Interventions No flowsheet data found.

## 2019-08-14 NOTE — Progress Notes (Signed)
Physical Therapy Treatment Patient Details Name: Heidi Cross MRN: [id number] DOB: July 04, 1940 Today's Date: 08/14/2019    History of Present Illness Per MD notes: Pt is a 79 y/o F with degenerative joint disease of the R knee and is s/p elective R TKA. PMH includes BCC, DDD, depression, GERD, HLD, and peripheral neuropathy    PT Comments    Pt pleasant and motivated to participate during the session. Pt had 9/10 back pain but was still agreeable to PT this afternoon, during sit-to stand, pt had difficulty gaining her balance and had several instances of LOB that required min assist. In addition, pt required cueing to firmly place the RW on the ground and to accept more weight through her RLE as she struggled to maintain static standing posture during a sit-to-stand transfer. Once pt achieved appropriate postural stability, pt had continued complaints of back pain and new complaints of nausea. Pt did not feel safe to ambulate this afternoon, but was able to maintain a standing position for around 5 minutes before needing to return to sitting secondary to back pain and nausea. Given that pt was limited for factors other than her R knee, pt should be followed closely for potential changes for discharge planning. Based on performance in previous sessions prior to onset of back pain and nausea, pt will most benefit from HHPT services upon discharge to safely address deficits listed in patient problem list for decreased caregiver assistance and eventual return to PLOF.      Follow Up Recommendations  Home health PT;Supervision for mobility/OOB     Equipment Recommendations  Rolling walker with 5" wheels;3in1 (PT)    Recommendations for Other Services       Precautions / Restrictions Precautions Precautions: Knee Precaution Booklet Issued: Yes (comment) Restrictions Weight Bearing Restrictions: Yes RLE Weight Bearing: Weight bearing as tolerated    Mobility  Bed Mobility Overal bed  mobility: Needs Assistance Bed Mobility: Supine to Sit;Sit to Supine     Supine to sit: Modified independent (Device/Increase time) Sit to supine: Modified independent (Device/Increase time)   General bed mobility comments: Pt found in recliner  Transfers Overall transfer level: Needs assistance Equipment used: Rolling walker (2 wheeled) Transfers: Sit to/from Stand Sit to Stand: Min guard;From elevated surface         General transfer comment: Pt had several instances of posterior LOB when standing up that required min assist to maintain upright posture. Pt had nausea when standing up, the did not improve over time  Ambulation/Gait   General Gait Details: Unable/unsafe to ambulate this afternoon   Stairs             Wheelchair Mobility    Modified Rankin (Stroke Patients Only)       Balance Overall balance assessment: Needs assistance Sitting-balance support: No upper extremity supported;Feet unsupported Sitting balance-Leahy Scale: Fair Sitting balance - Comments: Able to maintain sitting balance in recliner   Standing balance support: Bilateral upper extremity supported;During functional activity Standing balance-Leahy Scale: Poor Standing balance comment: Pt was unsteady in standing this afternoon with reports of nausea and back pain                            Cognition Arousal/Alertness: Awake/alert Behavior During Therapy: WFL for tasks assessed/performed Overall Cognitive Status: Within Functional Limits for tasks assessed  Exercises Total Joint Exercises Ankle Circles/Pumps: AROM;Strengthening;Both;20 reps Quad Sets: Strengthening;Both;10 reps Gluteal Sets: Strengthening;Both;10 reps Hip ABduction/ADduction: AAROM;Both;10 reps Straight Leg Raises: AAROM;Both;10 reps Long Arc Quad: AROM;Strengthening;Both;10 reps Knee Flexion: AROM;Strengthening;Both;10 reps Goniometric ROM: R  knee AROM: 3-84 deg Marching in Standing: AROM;Strengthening;Both;10 reps Other Exercises Other Exercises: HEP education Other Exercises: Statitc standing weigth shifting training Other Exercises: sit to/from stand transfer sequencing    General Comments        Pertinent Vitals/Pain Pain Assessment: 0-10 Pain Score: 9 (9/10 in lower back, 7/10 in R knee) Pain Location: R knee and lower back Pain Descriptors / Indicators: Aching;Sore Pain Intervention(s): Premedicated before session;Monitored during session;Ice applied    Home Living                      Prior Function            PT Goals (current goals can now be found in the care plan section) Progress towards PT goals: Progressing toward goals    Frequency    BID      PT Plan Current plan remains appropriate    Co-evaluation              AM-PAC PT "6 Clicks" Mobility   Outcome Measure  Help needed turning from your back to your side while in a flat bed without using bedrails?: A Little Help needed moving from lying on your back to sitting on the side of a flat bed without using bedrails?: A Little Help needed moving to and from a bed to a chair (including a wheelchair)?: A Little Help needed standing up from a chair using your arms (e.g., wheelchair or bedside chair)?: A Little Help needed to walk in hospital room?: A Lot Help needed climbing 3-5 steps with a railing? : A Lot 6 Click Score: 16    End of Session Equipment Utilized During Treatment: Gait belt Activity Tolerance: Other (comment);Patient limited by pain(Pt limited by back pain and by nausea) Patient left: in chair;with call bell/phone within reach;with chair alarm set;with nursing/sitter in room;with family/visitor present;with SCD's reapplied Nurse Communication: Mobility status;Precautions;Weight bearing status PT Visit Diagnosis: Unsteadiness on feet (R26.81);Other abnormalities of gait and mobility (R26.89);Muscle weakness  (generalized) (M62.81);Pain Pain - Right/Left: Right Pain - part of body: Knee     Time: [id number] PT Time Calculation (min) (ACUTE ONLY): 32 min  Charges:  $Gait Training: 8-22 mins $Therapeutic Exercise: 8-22 mins $Therapeutic Activity: 8-22 mins                     Annabelle Harman, SPT 08/14/19 3:46 PM

## 2019-08-14 NOTE — Anesthesia Postprocedure Evaluation (Signed)
Anesthesia Post Note  Patient: Heidi Cross  Procedure(s) Performed: RIGHT TOTAL KNEE ARTHROPLASTY (Right Knee)  Patient location during evaluation: Nursing Unit Anesthesia Type: Spinal Level of consciousness: awake, awake and alert and patient cooperative Pain management: pain level controlled Vital Signs Assessment: post-procedure vital signs reviewed and stable Respiratory status: spontaneous breathing and nonlabored ventilation Postop Assessment: no headache, no backache, no apparent nausea or vomiting, patient able to bend at knees and adequate PO intake Anesthetic complications: no     Last Vitals:  Vitals:   08/13/19 2013 08/13/19 2318  BP: 131/62 137/73  Pulse: 75 75  Resp: 16 16  Temp: (!) 36.4 C 36.9 C  SpO2: 98% 96%    Last Pain:  Vitals:   08/14/19 0550  TempSrc:   PainSc: Asleep                 [name],  [facility]

## 2019-08-14 NOTE — Progress Notes (Signed)
Physical Therapy Treatment Patient Details Name: Heidi Cross MRN: [id number] DOB: December 17, 1940 Today's Date: 08/14/2019    History of Present Illness Per MD notes: Pt is a 79 y/o F with degenerative joint disease of the R knee and is s/p elective R TKA. PMH includes BCC, DDD, depression, GERD, HLD, and peripheral neuropathy    PT Comments    Pt pleasant and motivated to participate during the session. Pt did not require physical assistance with bed mobility or transfers but required an increase in effort and time. With ambulation, pt became SOB with HR and SpO2 WNL before and after ambulating. Pt stated that she was mostly limited by SOB but also pain when ambulating 15 feet. Pt will benefit from HHPT services upon discharge to safely address deficits listed in patient problem list for decreased caregiver assistance and eventual return to PLOF.       Follow Up Recommendations  Home health PT;Supervision for mobility/OOB     Equipment Recommendations  Rolling walker with 5" wheels;3in1 (PT)    Recommendations for Other Services       Precautions / Restrictions Precautions Precautions: Knee Precaution Booklet Issued: Yes (comment) Restrictions Weight Bearing Restrictions: Yes RLE Weight Bearing: Weight bearing as tolerated    Mobility  Bed Mobility Overal bed mobility: Needs Assistance Bed Mobility: Supine to Sit;Sit to Supine     Supine to sit: Modified independent (Device/Increase time) Sit to supine: Modified independent (Device/Increase time)   General bed mobility comments: Extra time and effort needs but not physical assistance required  Transfers Overall transfer level: Needs assistance Equipment used: Rolling walker (2 wheeled) Transfers: Sit to/from Stand Sit to Stand: Min guard;From elevated surface         General transfer comment: Pt required multiple attempts to complete the transfer and needed an elevated surface.  Ambulation/Gait Ambulation/Gait  assistance: Min guard Gait Distance (Feet): 15 Feet Assistive device: Rolling walker (2 wheeled) Gait Pattern/deviations: Step-to pattern;Decreased step length - left;Decreased stance time - right;Decreased weight shift to right;Antalgic Gait velocity: decreased   General Gait Details: Small steps, cueing required for LE and RW sequencing, decreased stance time on the RLE resulted in decreased step length on the left. Mod BUE assist through RW in R stance, min in double leg stance or L stance.   Stairs             Wheelchair Mobility    Modified Rankin (Stroke Patients Only)       Balance Overall balance assessment: Needs assistance Sitting-balance support: No upper extremity supported;Feet unsupported Sitting balance-Leahy Scale: Fair Sitting balance - Comments: Able to maintain sitting balance without UE within BOS   Standing balance support: Bilateral upper extremity supported;During functional activity Standing balance-Leahy Scale: Fair Standing balance comment: Slight post lean in standing and min-mod BUE through the RW during functional activity                            Cognition Arousal/Alertness: Awake/alert Behavior During Therapy: WFL for tasks assessed/performed Overall Cognitive Status: Within Functional Limits for tasks assessed                                        Exercises Total Joint Exercises Ankle Circles/Pumps: AROM;Strengthening;Both;20 reps Quad Sets: Strengthening;Both;10 reps Gluteal Sets: Strengthening;Both;10 reps Hip ABduction/ADduction: AAROM;Both;10 reps Straight Leg Raises: AAROM;Both;10 reps Long Arc Quad:  AROM;Strengthening;Both;10 reps Knee Flexion: AROM;Strengthening;Both;10 reps Goniometric ROM: R knee AROM: 3-84 deg Marching in Standing: AROM;Strengthening;Both;10 reps Other Exercises Other Exercises: HEP education Other Exercises: RW training/sequencing Other Exercises: sit to/from stand  transfer sequencing    General Comments        Pertinent Vitals/Pain Pain Assessment: 0-10 Pain Score: 6  Pain Location: R knee Pain Descriptors / Indicators: Aching;Sore Pain Intervention(s): Monitored during session;Premedicated before session;Ice applied    Home Living                      Prior Function            PT Goals (current goals can now be found in the care plan section) Progress towards PT goals: Progressing toward goals    Frequency    BID      PT Plan Current plan remains appropriate    Co-evaluation              AM-PAC PT "6 Clicks" Mobility   Outcome Measure  Help needed turning from your back to your side while in a flat bed without using bedrails?: A Little Help needed moving from lying on your back to sitting on the side of a flat bed without using bedrails?: A Little Help needed moving to and from a bed to a chair (including a wheelchair)?: A Little Help needed standing up from a chair using your arms (e.g., wheelchair or bedside chair)?: A Little Help needed to walk in hospital room?: A Little Help needed climbing 3-5 steps with a railing? : A Lot 6 Click Score: 17    End of Session Equipment Utilized During Treatment: Gait belt Activity Tolerance: Patient tolerated treatment well Patient left: in bed;with call bell/phone within reach;with bed alarm set;with SCD's reapplied Nurse Communication: Mobility status;Precautions;Weight bearing status PT Visit Diagnosis: Unsteadiness on feet (R26.81);Other abnormalities of gait and mobility (R26.89);Muscle weakness (generalized) (M62.81);Pain Pain - Right/Left: Right Pain - part of body: Knee     Time: [id number] PT Time Calculation (min) (ACUTE ONLY): 48 min  Charges:                        Annabelle Harman, SPT 08/14/19 1:00 PM

## 2019-08-14 NOTE — Progress Notes (Signed)
PT Cancellation Note  Patient Details Name: Heidi Cross MRN: [id number] DOB: 1941-02-07   Cancelled Treatment:    Reason Eval/Treat Not Completed: Patient declined PT session secondary to "back spasms" and requested pain medication, nursing notified. Will attempt to see pt at a future date/time as medically appropriate.     Linus Salmons PT, DPT 08/14/19, 1:54 PM

## 2019-08-15 LAB — CBC
HCT: 30.5 % — ABNORMAL LOW (ref 36.0–46.0)
Hemoglobin: 10.1 g/dL — ABNORMAL LOW (ref 12.0–15.0)
MCH: 32.5 pg (ref 26.0–34.0)
MCHC: 33.1 g/dL (ref 30.0–36.0)
MCV: 98.1 fL (ref 80.0–100.0)
Platelets: 234 10*3/uL (ref 150–400)
RBC: 3.11 MIL/uL — ABNORMAL LOW (ref 3.87–5.11)
RDW: 12.4 % (ref 11.5–15.5)
WBC: 7.7 10*3/uL (ref 4.0–10.5)
nRBC: 0 % (ref 0.0–0.2)

## 2019-08-15 LAB — BASIC METABOLIC PANEL
Anion gap: 10 (ref 5–15)
BUN: 13 mg/dL (ref 8–23)
CO2: 24 mmol/L (ref 22–32)
Calcium: 8.7 mg/dL — ABNORMAL LOW (ref 8.9–10.3)
Chloride: 98 mmol/L (ref 98–111)
Creatinine, Ser: 0.67 mg/dL (ref 0.44–1.00)
GFR calc Af Amer: >60 mL/min (ref >60)
GFR calc non Af Amer: >60 mL/min (ref >60)
Glucose, Bld: 131 mg/dL — ABNORMAL HIGH (ref 70–99)
Potassium: 4.1 mmol/L (ref 3.5–5.1)
Sodium: 132 mmol/L — ABNORMAL LOW (ref 135–145)

## 2019-08-15 NOTE — Progress Notes (Signed)
  Subjective: 2 Days Post-Op Procedure(s) (LRB): RIGHT TOTAL KNEE ARTHROPLASTY (Right) Patient reports pain as moderate.  She has requested pain medication. Patient is well, and has had no acute complaints or problems Plan is to go Home after hospital stay. Negative for chest pain and shortness of breath Fever: no Gastrointestinal: negative for nausea and vomiting. Does report some nausea yesterday. Patient has not had a bowel movement. She reports passing gas.  Objective: Vital signs in last 24 hours: Temp:  [98.5 F (36.9 C)-98.8 F (37.1 C)] 98.8 F (37.1 C) (03/27 0737) Pulse Rate:  [82-87] 87 (03/27 0737) Resp:  [16-17] 16 (03/27 0737) BP: (151-159)/(61-70) 151/61 (03/27 0737) SpO2:  [98 %] 98 % (03/27 0737)  Intake/Output from previous day:  Intake/Output Summary (Last 24 hours) at 08/15/2019 0938 Last data filed at 08/15/2019 0500 Gross per 24 hour  Intake --  Output 950 ml  Net -950 ml    Intake/Output this shift: No intake/output data recorded.  Labs: Recent Labs    08/14/19 0454 08/15/19 0501  HGB 10.1* 10.1*   Recent Labs    08/14/19 0454 08/15/19 0501  WBC 8.7 7.7  RBC 3.19* 3.11*  HCT 31.0* 30.5*  PLT 228 234   Recent Labs    08/14/19 0454 08/15/19 0501  NA 136 132*  K 3.8 4.1  CL 103 98  CO2 25 24  BUN 10 13  CREATININE 0.71 0.67  GLUCOSE 117* 131*  CALCIUM 8.6* 8.7*   No results for input(s): LABPT, INR in the last 72 hours.   EXAM General - Patient is Alert, Appropriate and Oriented Extremity - Neurovascular intact Dorsiflexion/Plantar flexion intact Compartment soft Dressing/Incision -Polar Care in place and working. Mild sanguinous drainage noted. Motor Function - intact, moving foot and toes well on exam.  Cardiovascular- Regular rate and rhythm, no murmurs/rubs/gallops Respiratory- Lungs clear to auscultation bilaterally Gastrointestinal- soft, nontender and hypoactive bowel sounds   Assessment/Plan: 2 Days Post-Op  Procedure(s) (LRB): RIGHT TOTAL KNEE ARTHROPLASTY (Right) Active Problems:   Status post total knee replacement using cement, right  Estimated body mass index is 28.8 kg/m as calculated from the following:   Height as of this encounter: 5\' 9"  (1.753 m).   Weight as of this encounter: 88.5 kg. Advance diet Up with therapy Discharge today or tomorrow pending completion of therapy goals.  Fresh honeycomb bandage placed.   DVT Prophylaxis - Lovenox, Ted hose and foot pumps Weight-Bearing as tolerated to right leg  Cassell Smiles, PA-C [facility] 08/15/2019, 9:38 AM

## 2019-08-15 NOTE — Progress Notes (Signed)
Physical Therapy Treatment Patient Details Name: Heidi Cross MRN: [id number] DOB: [birth year] Today's Date: 08/15/2019    History of Present Illness Per MD notes: Pt is a 79 y/o F with degenerative joint disease of the R knee and is s/p elective R TKA. PMH includes BCC, DDD, depression, GERD, HLD, and peripheral neuropathy    PT Comments    Pt was seen for mobility on RW, noted her difficulty with maintaining posterior balance and having awareness and making moves to control balance.  Her plan is to go home but neither controls transfers and gait well enough nor has enough balance protection responses to feel comfortable that home is the best first choice.  Follow with her to work on controlling mobility and work toward her goal but concern for safety is still there.   Follow Up Recommendations  Supervision for mobility/OOB;SNF     Equipment Recommendations  Rolling walker with 5" wheels;3in1 (PT)    Recommendations for Other Services       Precautions / Restrictions Precautions Precautions: Knee Precaution Booklet Issued: Yes (comment) Restrictions Weight Bearing Restrictions: Yes RLE Weight Bearing: Weight bearing as tolerated    Mobility  Bed Mobility Overal bed mobility: Needs Assistance             General bed mobility comments: up in chair  Transfers Overall transfer level: Needs assistance Equipment used: Rolling walker (2 wheeled) Transfers: Sit to/from Stand Sit to Stand: Mod assist         General transfer comment: mult episodes posterior LOB that pt cannot give a reason for having  Ambulation/Gait Ambulation/Gait assistance: Mod assist Gait Distance (Feet): 8 Feet Assistive device: Rolling walker (2 wheeled) Gait Pattern/deviations: Step-to pattern;Step-through pattern;Decreased stance time - right;Decreased step length - left;Wide base of support;Trunk flexed Gait velocity: decreased Gait velocity interpretation: <1.31 ft/sec, indicative of  household ambulator General Gait Details: pt listed to L and backward very unsafely, no ability to regain balance without PT to assist   Stairs             Wheelchair Mobility    Modified Rankin (Stroke Patients Only)       Balance Overall balance assessment: Needs assistance Sitting-balance support: Feet supported Sitting balance-Leahy Scale: Fair Sitting balance - Comments: Able to maintain sitting balance in recliner   Standing balance support: Bilateral upper extremity supported;During functional activity Standing balance-Leahy Scale: Poor Standing balance comment: pt reported her unsteadiness was from back yesterday but today is not hurting                            Cognition Arousal/Alertness: Awake/alert Behavior During Therapy: WFL for tasks assessed/performed Overall Cognitive Status: No family/caregiver present to determine baseline cognitive functioning                                 General Comments: pt seems a bit confused about some history and how she is doing      Exercises Total Joint Exercises Ankle Circles/Pumps: AROM;10 reps Quad Sets: AROM;10 reps Gluteal Sets: AROM;10 reps Hip ABduction/ADduction: AAROM;10 reps    General Comments        Pertinent Vitals/Pain Pain Score: 7  Pain Location: R knee, back is better Pain Descriptors / Indicators: Aching;Sore Pain Intervention(s): Limited activity within patient's tolerance;Monitored during session;Premedicated before session;Repositioned    Home Living  Prior Function            PT Goals (current goals can now be found in the care plan section) Acute Rehab PT Goals Patient Stated Goal: go home instead of SNF Progress towards PT goals: Not progressing toward goals - comment    Frequency    BID      PT Plan Discharge plan needs to be updated    Co-evaluation              AM-PAC PT "6 Clicks" Mobility   Outcome  Measure  Help needed turning from your back to your side while in a flat bed without using bedrails?: A Little Help needed moving from lying on your back to sitting on the side of a flat bed without using bedrails?: A Lot Help needed moving to and from a bed to a chair (including a wheelchair)?: A Lot Help needed standing up from a chair using your arms (e.g., wheelchair or bedside chair)?: A Lot Help needed to walk in hospital room?: A Lot Help needed climbing 3-5 steps with a railing? : A Lot 6 Click Score: 13    End of Session Equipment Utilized During Treatment: Gait belt Activity Tolerance: Patient limited by fatigue;Treatment limited secondary to medical complications (Comment) Patient left: in chair;with chair alarm set;with call bell/phone within reach Nurse Communication: Mobility status;Precautions;Weight bearing status PT Visit Diagnosis: Unsteadiness on feet (R26.81);Other abnormalities of gait and mobility (R26.89);Muscle weakness (generalized) (M62.81);Pain Pain - Right/Left: Right Pain - part of body: Knee     Time: 0932-1006 PT Time Calculation (min) (ACUTE ONLY): 34 min  Charges:  $Gait Training: 8-22 mins $Therapeutic Exercise: 8-22 mins                   Ramond Dial 08/15/2019, 12:25 PM   Mee Hives, PT MS Acute Rehab Dept. Number: [place] and [place]

## 2019-08-15 NOTE — Progress Notes (Signed)
Physical Therapy Treatment Patient Details Name: Heidi Cross MRN: [id number] DOB: 10/19/1940 Today's Date: 08/15/2019    History of Present Illness Per MD notes: Pt is a 79 y/o F with degenerative joint disease of the R knee and is s/p elective R TKA. PMH includes BCC, DDD, depression, GERD, HLD, and peripheral neuropathy    PT Comments    Pt was seen with her family in attendance, husband and daughter were there to discuss her functional level, HEP and the plan for pt to go home.  Recommended SNF to them but they are going to be more comfortable with home given current Covid environment and the fact that they have been planning for this knee surgery for some time with her, including her assistance needed at home.  Pt was assisted to get back in chair with better spine support, heat in place and with instruction to the family about car transfers at DC.  Will try a step in the room, as pt has to ascend one to get into the house.   Follow Up Recommendations  Supervision for mobility/OOB;SNF     Equipment Recommendations  Rolling walker with 5" wheels;3in1 (PT)    Recommendations for Other Services       Precautions / Restrictions Precautions Precautions: Knee Precaution Booklet Issued: Yes (comment) Restrictions Weight Bearing Restrictions: Yes RLE Weight Bearing: Weight bearing as tolerated    Mobility  Bed Mobility Overal bed mobility: Needs Assistance             General bed mobility comments: up in chair  Transfers Overall transfer level: Needs assistance Equipment used: Rolling walker (2 wheeled) Transfers: Sit to/from Stand Sit to Stand: Mod assist         General transfer comment: mult episodes posterior LOB that pt cannot give a reason for having  Ambulation/Gait Ambulation/Gait assistance: Mod assist Gait Distance (Feet): 24 Feet(12x2) Assistive device: Rolling walker (2 wheeled) Gait Pattern/deviations: Step-to pattern;Step-through pattern;Decreased  stance time - right;Decreased step length - left;Wide base of support;Trunk flexed Gait velocity: decreased Gait velocity interpretation: <1.31 ft/sec, indicative of household ambulator General Gait Details: pt listed to L and backward very unsafely, no ability to regain balance without PT to assist   Stairs             Wheelchair Mobility    Modified Rankin (Stroke Patients Only)       Balance Overall balance assessment: Needs assistance Sitting-balance support: Feet supported Sitting balance-Leahy Scale: Fair Sitting balance - Comments: Able to maintain sitting balance in recliner   Standing balance support: Bilateral upper extremity supported;During functional activity Standing balance-Leahy Scale: Poor Standing balance comment: pt reported her unsteadiness was from back yesterday but today is not hurting                            Cognition Arousal/Alertness: Awake/alert Behavior During Therapy: WFL for tasks assessed/performed Overall Cognitive Status: No family/caregiver present to determine baseline cognitive functioning                                 General Comments: pt seems a bit confused about some history and how she is doing      Exercises Total Joint Exercises Ankle Circles/Pumps: AROM;10 reps Quad Sets: AROM;10 reps Long Arc Quad: AROM;AAROM;15 reps Knee Flexion: AAROM;AROM;15 reps Goniometric ROM: 75 flexion    General Comments  Pertinent Vitals/Pain Pain Assessment: 0-10 Pain Score: 7  Pain Location: R knee, back is better Pain Descriptors / Indicators: Aching;Sore    Home Living                      Prior Function            PT Goals (current goals can now be found in the care plan section) Acute Rehab PT Goals Patient Stated Goal: go home instead of SNF Progress towards PT goals: Progressing toward goals    Frequency    BID      PT Plan Discharge plan needs to be updated     Co-evaluation              AM-PAC PT "6 Clicks" Mobility   Outcome Measure  Help needed turning from your back to your side while in a flat bed without using bedrails?: A Little Help needed moving from lying on your back to sitting on the side of a flat bed without using bedrails?: A Lot Help needed moving to and from a bed to a chair (including a wheelchair)?: A Lot Help needed standing up from a chair using your arms (e.g., wheelchair or bedside chair)?: A Lot Help needed to walk in hospital room?: A Lot Help needed climbing 3-5 steps with a railing? : A Lot 6 Click Score: 13    End of Session Equipment Utilized During Treatment: Gait belt Activity Tolerance: Patient limited by fatigue;Treatment limited secondary to medical complications (Comment) Patient left: in chair;with chair alarm set;with call bell/phone within reach Nurse Communication: Mobility status;Precautions;Weight bearing status PT Visit Diagnosis: Unsteadiness on feet (R26.81);Other abnormalities of gait and mobility (R26.89);Muscle weakness (generalized) (M62.81);Pain Pain - Right/Left: Right Pain - part of body: Knee     Time: 1417-1500 PT Time Calculation (min) (ACUTE ONLY): 43 min  Charges:  $Gait Training: 8-22 mins $Therapeutic Exercise: 8-22 mins $Therapeutic Activity: 8-22 mins                    Ramond Dial 08/15/2019, 4:07 PM  Mee Hives, PT MS Acute Rehab Dept. Number: [place] and [place]

## 2019-08-16 LAB — BASIC METABOLIC PANEL
Anion gap: 7 (ref 5–15)
BUN: 12 mg/dL (ref 8–23)
CO2: 25 mmol/L (ref 22–32)
Calcium: 8.3 mg/dL — ABNORMAL LOW (ref 8.9–10.3)
Chloride: 98 mmol/L (ref 98–111)
Creatinine, Ser: 0.59 mg/dL (ref 0.44–1.00)
GFR calc Af Amer: >60 mL/min (ref >60)
GFR calc non Af Amer: >60 mL/min (ref >60)
Glucose, Bld: 122 mg/dL — ABNORMAL HIGH (ref 70–99)
Potassium: 4.1 mmol/L (ref 3.5–5.1)
Sodium: 130 mmol/L — ABNORMAL LOW (ref 135–145)

## 2019-08-16 NOTE — Progress Notes (Signed)
Physical Therapy Treatment Patient Details Name: Heidi Cross MRN: [id number] DOB: Jun 06, 1940 Today's Date: 08/16/2019    History of Present Illness Per MD notes: Pt is a 79 y/o F with degenerative joint disease of the R knee and is s/p elective R TKA. PMH includes BCC, DDD, depression, GERD, HLD, and peripheral neuropathy    PT Comments    Pt was able to cover all the concerns about her mobility in session this AM, noting her tolerance for ROM to R knee, able to step up a step for entering house and can walk with much greater distance, no instability and better management of walker.  Family was not in yet but pt is ready to go and will tolerate home demands much better now.  Follow acutely if dc is held for some reason.     Follow Up Recommendations  Home health PT;Supervision for mobility/OOB     Equipment Recommendations  Rolling walker with 5" wheels;3in1 (PT)    Recommendations for Other Services       Precautions / Restrictions Precautions Precautions: Knee Precaution Booklet Issued: Yes (comment) Restrictions Weight Bearing Restrictions: Yes RLE Weight Bearing: Weight bearing as tolerated    Mobility  Bed Mobility               General bed mobility comments: up in chair  Transfers Overall transfer level: Needs assistance Equipment used: Rolling walker (2 wheeled) Transfers: Sit to/from Stand Sit to Stand: Min guard         General transfer comment: able to take her time and lift off chair  Ambulation/Gait Ambulation/Gait assistance: Min guard Gait Distance (Feet): 30 Feet Assistive device: Rolling walker (2 wheeled) Gait Pattern/deviations: Step-to pattern;Decreased stride length;Wide base of support;Trunk flexed Gait velocity: decreased Gait velocity interpretation: <1.31 ft/sec, indicative of household ambulator General Gait Details: more controlled with RW, able to turn without cues   Stairs Stairs: Yes Stairs assistance: Min guard;Min  assist Stair Management: No rails Number of Stairs: 1 General stair comments: pt lifted walker to platform and pulled up with min assist and cues on LLE, backed down on RLE first with LLE supporting   Wheelchair Mobility    Modified Rankin (Stroke Patients Only)       Balance Overall balance assessment: Needs assistance Sitting-balance support: Feet supported Sitting balance-Leahy Scale: Good     Standing balance support: Bilateral upper extremity supported;During functional activity Standing balance-Leahy Scale: Fair Standing balance comment: able to release belt with pt statically standing on walker                            Cognition Arousal/Alertness: Awake/alert Behavior During Therapy: WFL for tasks assessed/performed Overall Cognitive Status: Within Functional Limits for tasks assessed                                 General Comments: pt is more alert and clear, sleep seems helpful, had meds      Exercises Total Joint Exercises Ankle Circles/Pumps: AAROM;AROM;5 reps Quad Sets: AROM;10 reps Gluteal Sets: AROM;10 reps Long Arc Quad: AAROM;AROM;10 reps Goniometric ROM: 83 flexion, -5 ext    General Comments General comments (skin integrity, edema, etc.): good tolerance for all mobility and controlled balance, able to move R knee through greater ROM as well      Pertinent Vitals/Pain Pain Assessment: 0-10 Pain Score: 6  Pain Location: R knee  with movement Pain Descriptors / Indicators: Operative site guarding Pain Intervention(s): Monitored during session;Premedicated before session;Repositioned;Ice applied    Home Living                      Prior Function            PT Goals (current goals can now be found in the care plan section) Acute Rehab PT Goals Patient Stated Goal: go home instead of SNF Progress towards PT goals: Progressing toward goals    Frequency    BID      PT Plan Discharge plan needs to be  updated    Co-evaluation              AM-PAC PT "6 Clicks" Mobility   Outcome Measure  Help needed turning from your back to your side while in a flat bed without using bedrails?: None Help needed moving from lying on your back to sitting on the side of a flat bed without using bedrails?: A Little Help needed moving to and from a bed to a chair (including a wheelchair)?: A Little Help needed standing up from a chair using your arms (e.g., wheelchair or bedside chair)?: A Little Help needed to walk in hospital room?: A Little Help needed climbing 3-5 steps with a railing? : A Little 6 Click Score: 19    End of Session Equipment Utilized During Treatment: Gait belt Activity Tolerance: Patient limited by fatigue Patient left: in chair;with call bell/phone within reach;with chair alarm set Nurse Communication: Mobility status PT Visit Diagnosis: Unsteadiness on feet (R26.81);Other abnormalities of gait and mobility (R26.89);Muscle weakness (generalized) (M62.81);Pain Pain - Right/Left: Right Pain - part of body: Knee     Time: [id number] PT Time Calculation (min) (ACUTE ONLY): 34 min  Charges:  $Gait Training: 8-22 mins $Therapeutic Exercise: 8-22 mins                  Ramond Dial 08/16/2019, 12:08 PM  Mee Hives, PT MS Acute Rehab Dept. Number: [place] and [place]

## 2019-08-16 NOTE — Progress Notes (Addendum)
  Subjective: 3 Days Post-Op Procedure(s) (LRB): RIGHT TOTAL KNEE ARTHROPLASTY (Right) Patient reports pain as well-controlled.  She states she had an oxycodone around an hour ago and her pain is doing well at this time.  Patient is well, and has had no acute complaints or problems Plan is to go Home after hospital stay. Negative for chest pain and shortness of breath Fever: no Gastrointestinal: negative for nausea and vomiting.  Patient has had a bowel movement.  Objective: Vital signs in last 24 hours: Temp:  [98.4 F (36.9 C)-98.6 F (37 C)] 98.6 F (37 C) (03/28 0112) Pulse Rate:  [85-91] 90 (03/28 0112) Resp:  [15-16] 16 (03/28 0112) BP: (132-137)/(58-67) 134/67 (03/28 0112) SpO2:  [97 %-100 %] 100 % (03/28 0112)  Intake/Output from previous day:  Intake/Output Summary (Last 24 hours) at 08/16/2019 1111 Last data filed at 08/16/2019 1017 Gross per 24 hour  Intake 240 ml  Output --  Net 240 ml    Intake/Output this shift: Total I/O In: 240 [P.O.:240] Out: -   Labs: Recent Labs    08/14/19 0454 08/15/19 0501  HGB 10.1* 10.1*   Recent Labs    08/14/19 0454 08/15/19 0501  WBC 8.7 7.7  RBC 3.19* 3.11*  HCT 31.0* 30.5*  PLT 228 234   Recent Labs    08/15/19 0501 08/16/19 0539  NA 132* 130*  K 4.1 4.1  CL 98 98  CO2 24 25  BUN 13 12  CREATININE 0.67 0.59  GLUCOSE 131* 122*  CALCIUM 8.7* 8.3*   No results for input(s): LABPT, INR in the last 72 hours.   EXAM General - Patient is Alert, Appropriate and Oriented Extremity - Neurovascular intact Dorsiflexion/Plantar flexion intact Compartment soft, TED hose on Dressing/Incision -mild sanguinous drainage noted distally, minimal erythema noted proximally; PolarCare in place but without any ice. Motor Function - intact, moving foot and toes well on exam.  Cardiovascular- Regular rate and rhythm, no murmurs/rubs/gallops Respiratory- faint crackles heard in lung bases, right more than  left    Assessment/Plan: 3 Days Post-Op Procedure(s) (LRB): RIGHT TOTAL KNEE ARTHROPLASTY (Right) Active Problems:   Status post total knee replacement using cement, right  Estimated body mass index is 28.8 kg/m as calculated from the following:   Height as of this encounter: 5\' 9"  (1.753 m).   Weight as of this encounter: 88.5 kg. Advance diet Up with therapy Discharge home with home health  Reviewed importance of incentive spirometer with patient. Patient understands.   DVT Prophylaxis - Lovenox, Ted hose and foot pumps Weight-Bearing as tolerated to right leg  Cassell Smiles, PA-C [facility] Orthopaedic Surgery 08/16/2019, 11:11 AM

## 2019-08-16 NOTE — Plan of Care (Signed)
  Problem: Education: Goal: Knowledge of General Education information will improve Description: Including pain rating scale, medication(s)/side effects and non-pharmacologic comfort measures Outcome: Progressing   Problem: Health Behavior/Discharge Planning: Goal: Ability to manage health-related needs will improve Outcome: Progressing   Problem: Clinical Measurements: Goal: Ability to maintain clinical measurements within normal limits will improve Outcome: Progressing Goal: Will remain free from infection Outcome: Progressing Goal: Diagnostic test results will improve Outcome: Progressing Goal: Respiratory complications will improve Outcome: Progressing Goal: Cardiovascular complication will be avoided Outcome: Progressing   Problem: Coping: Goal: Level of anxiety will decrease Outcome: Progressing   Problem: Elimination: Goal: Will not experience complications related to bowel motility Outcome: Progressing Goal: Will not experience complications related to urinary retention Outcome: Progressing   Problem: Pain Managment: Goal: General experience of comfort will improve Outcome: Progressing   Problem: Pain Managment: Goal: General experience of comfort will improve Outcome: Progressing   Problem: Safety: Goal: Ability to remain free from injury will improve Outcome: Progressing   Problem: Skin Integrity: Goal: Risk for impaired skin integrity will decrease Outcome: Progressing   Problem: Education: Goal: Knowledge of the prescribed therapeutic regimen will improve Outcome: Progressing Goal: Individualized Educational Video(s) Outcome: Progressing   Problem: Activity: Goal: Ability to avoid complications of mobility impairment will improve Outcome: Progressing Goal: Range of joint motion will improve Outcome: Progressing   Problem: Clinical Measurements: Goal: Postoperative complications will be avoided or minimized Outcome: Progressing   Problem: Pain  Management: Goal: Pain level will decrease with appropriate interventions Outcome: Progressing   Problem: Skin Integrity: Goal: Will show signs of wound healing Outcome: Progressing

## 2019-08-18 DIAGNOSIS — K219 Gastro-esophageal reflux disease without esophagitis: Secondary | ICD-10-CM | POA: Diagnosis not present

## 2019-08-18 DIAGNOSIS — M5136 Other intervertebral disc degeneration, lumbar region: Secondary | ICD-10-CM | POA: Diagnosis not present

## 2019-08-18 DIAGNOSIS — N3941 Urge incontinence: Secondary | ICD-10-CM | POA: Diagnosis not present

## 2019-08-18 DIAGNOSIS — E785 Hyperlipidemia, unspecified: Secondary | ICD-10-CM | POA: Diagnosis not present

## 2019-08-18 DIAGNOSIS — D482 Neoplasm of uncertain behavior of peripheral nerves and autonomic nervous system: Secondary | ICD-10-CM | POA: Diagnosis not present

## 2019-08-18 DIAGNOSIS — Z471 Aftercare following joint replacement surgery: Secondary | ICD-10-CM | POA: Diagnosis not present

## 2019-08-18 DIAGNOSIS — G4700 Insomnia, unspecified: Secondary | ICD-10-CM | POA: Diagnosis not present

## 2019-08-18 DIAGNOSIS — F329 Major depressive disorder, single episode, unspecified: Secondary | ICD-10-CM | POA: Diagnosis not present

## 2019-08-18 DIAGNOSIS — G629 Polyneuropathy, unspecified: Secondary | ICD-10-CM | POA: Diagnosis not present

## 2019-08-19 DIAGNOSIS — G629 Polyneuropathy, unspecified: Secondary | ICD-10-CM | POA: Diagnosis not present

## 2019-08-19 DIAGNOSIS — K219 Gastro-esophageal reflux disease without esophagitis: Secondary | ICD-10-CM | POA: Diagnosis not present

## 2019-08-19 DIAGNOSIS — Z471 Aftercare following joint replacement surgery: Secondary | ICD-10-CM | POA: Diagnosis not present

## 2019-08-19 DIAGNOSIS — N3941 Urge incontinence: Secondary | ICD-10-CM | POA: Diagnosis not present

## 2019-08-19 DIAGNOSIS — M5136 Other intervertebral disc degeneration, lumbar region: Secondary | ICD-10-CM | POA: Diagnosis not present

## 2019-08-19 DIAGNOSIS — G4700 Insomnia, unspecified: Secondary | ICD-10-CM | POA: Diagnosis not present

## 2019-08-19 DIAGNOSIS — E785 Hyperlipidemia, unspecified: Secondary | ICD-10-CM | POA: Diagnosis not present

## 2019-08-19 DIAGNOSIS — D482 Neoplasm of uncertain behavior of peripheral nerves and autonomic nervous system: Secondary | ICD-10-CM | POA: Diagnosis not present

## 2019-08-19 DIAGNOSIS — F329 Major depressive disorder, single episode, unspecified: Secondary | ICD-10-CM | POA: Diagnosis not present

## 2019-08-21 DIAGNOSIS — F329 Major depressive disorder, single episode, unspecified: Secondary | ICD-10-CM | POA: Diagnosis not present

## 2019-08-21 DIAGNOSIS — D482 Neoplasm of uncertain behavior of peripheral nerves and autonomic nervous system: Secondary | ICD-10-CM | POA: Diagnosis not present

## 2019-08-21 DIAGNOSIS — N3941 Urge incontinence: Secondary | ICD-10-CM | POA: Diagnosis not present

## 2019-08-21 DIAGNOSIS — M5136 Other intervertebral disc degeneration, lumbar region: Secondary | ICD-10-CM | POA: Diagnosis not present

## 2019-08-21 DIAGNOSIS — E785 Hyperlipidemia, unspecified: Secondary | ICD-10-CM | POA: Diagnosis not present

## 2019-08-21 DIAGNOSIS — G629 Polyneuropathy, unspecified: Secondary | ICD-10-CM | POA: Diagnosis not present

## 2019-08-21 DIAGNOSIS — K219 Gastro-esophageal reflux disease without esophagitis: Secondary | ICD-10-CM | POA: Diagnosis not present

## 2019-08-21 DIAGNOSIS — Z471 Aftercare following joint replacement surgery: Secondary | ICD-10-CM | POA: Diagnosis not present

## 2019-08-21 DIAGNOSIS — G4700 Insomnia, unspecified: Secondary | ICD-10-CM | POA: Diagnosis not present

## 2019-08-24 DIAGNOSIS — Z471 Aftercare following joint replacement surgery: Secondary | ICD-10-CM | POA: Diagnosis not present

## 2019-08-24 DIAGNOSIS — E785 Hyperlipidemia, unspecified: Secondary | ICD-10-CM | POA: Diagnosis not present

## 2019-08-24 DIAGNOSIS — G629 Polyneuropathy, unspecified: Secondary | ICD-10-CM | POA: Diagnosis not present

## 2019-08-24 DIAGNOSIS — K219 Gastro-esophageal reflux disease without esophagitis: Secondary | ICD-10-CM | POA: Diagnosis not present

## 2019-08-24 DIAGNOSIS — F329 Major depressive disorder, single episode, unspecified: Secondary | ICD-10-CM | POA: Diagnosis not present

## 2019-08-24 DIAGNOSIS — G4700 Insomnia, unspecified: Secondary | ICD-10-CM | POA: Diagnosis not present

## 2019-08-24 DIAGNOSIS — N3941 Urge incontinence: Secondary | ICD-10-CM | POA: Diagnosis not present

## 2019-08-24 DIAGNOSIS — D482 Neoplasm of uncertain behavior of peripheral nerves and autonomic nervous system: Secondary | ICD-10-CM | POA: Diagnosis not present

## 2019-08-24 DIAGNOSIS — M5136 Other intervertebral disc degeneration, lumbar region: Secondary | ICD-10-CM | POA: Diagnosis not present

## 2019-08-25 DIAGNOSIS — E785 Hyperlipidemia, unspecified: Secondary | ICD-10-CM | POA: Diagnosis not present

## 2019-08-25 DIAGNOSIS — Z471 Aftercare following joint replacement surgery: Secondary | ICD-10-CM | POA: Diagnosis not present

## 2019-08-25 DIAGNOSIS — M5136 Other intervertebral disc degeneration, lumbar region: Secondary | ICD-10-CM | POA: Diagnosis not present

## 2019-08-25 DIAGNOSIS — D482 Neoplasm of uncertain behavior of peripheral nerves and autonomic nervous system: Secondary | ICD-10-CM | POA: Diagnosis not present

## 2019-08-25 DIAGNOSIS — N3941 Urge incontinence: Secondary | ICD-10-CM | POA: Diagnosis not present

## 2019-08-25 DIAGNOSIS — F329 Major depressive disorder, single episode, unspecified: Secondary | ICD-10-CM | POA: Diagnosis not present

## 2019-08-25 DIAGNOSIS — G629 Polyneuropathy, unspecified: Secondary | ICD-10-CM | POA: Diagnosis not present

## 2019-08-25 DIAGNOSIS — G4700 Insomnia, unspecified: Secondary | ICD-10-CM | POA: Diagnosis not present

## 2019-08-25 DIAGNOSIS — K219 Gastro-esophageal reflux disease without esophagitis: Secondary | ICD-10-CM | POA: Diagnosis not present

## 2019-08-27 DIAGNOSIS — Z471 Aftercare following joint replacement surgery: Secondary | ICD-10-CM | POA: Diagnosis not present

## 2019-08-27 DIAGNOSIS — K219 Gastro-esophageal reflux disease without esophagitis: Secondary | ICD-10-CM | POA: Diagnosis not present

## 2019-08-27 DIAGNOSIS — G4700 Insomnia, unspecified: Secondary | ICD-10-CM | POA: Diagnosis not present

## 2019-08-27 DIAGNOSIS — M5136 Other intervertebral disc degeneration, lumbar region: Secondary | ICD-10-CM | POA: Diagnosis not present

## 2019-08-27 DIAGNOSIS — G629 Polyneuropathy, unspecified: Secondary | ICD-10-CM | POA: Diagnosis not present

## 2019-08-27 DIAGNOSIS — F329 Major depressive disorder, single episode, unspecified: Secondary | ICD-10-CM | POA: Diagnosis not present

## 2019-08-27 DIAGNOSIS — E785 Hyperlipidemia, unspecified: Secondary | ICD-10-CM | POA: Diagnosis not present

## 2019-08-27 DIAGNOSIS — D482 Neoplasm of uncertain behavior of peripheral nerves and autonomic nervous system: Secondary | ICD-10-CM | POA: Diagnosis not present

## 2019-08-27 DIAGNOSIS — N3941 Urge incontinence: Secondary | ICD-10-CM | POA: Diagnosis not present

## 2019-08-28 DIAGNOSIS — Z96651 Presence of right artificial knee joint: Secondary | ICD-10-CM | POA: Diagnosis not present

## 2019-08-28 DIAGNOSIS — M25661 Stiffness of right knee, not elsewhere classified: Secondary | ICD-10-CM | POA: Diagnosis not present

## 2019-08-28 DIAGNOSIS — M25561 Pain in right knee: Secondary | ICD-10-CM | POA: Diagnosis not present

## 2019-08-28 DIAGNOSIS — R29898 Other symptoms and signs involving the musculoskeletal system: Secondary | ICD-10-CM | POA: Diagnosis not present

## 2019-08-31 DIAGNOSIS — Z96651 Presence of right artificial knee joint: Secondary | ICD-10-CM | POA: Diagnosis not present

## 2019-08-31 DIAGNOSIS — M25561 Pain in right knee: Secondary | ICD-10-CM | POA: Diagnosis not present

## 2019-09-02 DIAGNOSIS — Z96651 Presence of right artificial knee joint: Secondary | ICD-10-CM | POA: Diagnosis not present

## 2019-09-02 DIAGNOSIS — M25561 Pain in right knee: Secondary | ICD-10-CM | POA: Diagnosis not present

## 2019-09-04 DIAGNOSIS — M25561 Pain in right knee: Secondary | ICD-10-CM | POA: Diagnosis not present

## 2019-09-04 DIAGNOSIS — Z96651 Presence of right artificial knee joint: Secondary | ICD-10-CM | POA: Diagnosis not present

## 2019-09-07 DIAGNOSIS — M25561 Pain in right knee: Secondary | ICD-10-CM | POA: Diagnosis not present

## 2019-09-07 DIAGNOSIS — Z96651 Presence of right artificial knee joint: Secondary | ICD-10-CM | POA: Diagnosis not present

## 2019-09-09 DIAGNOSIS — M25561 Pain in right knee: Secondary | ICD-10-CM | POA: Diagnosis not present

## 2019-09-10 DIAGNOSIS — H2513 Age-related nuclear cataract, bilateral: Secondary | ICD-10-CM | POA: Diagnosis not present

## 2019-09-11 DIAGNOSIS — Z96651 Presence of right artificial knee joint: Secondary | ICD-10-CM | POA: Diagnosis not present

## 2019-09-16 DIAGNOSIS — Z96651 Presence of right artificial knee joint: Secondary | ICD-10-CM | POA: Diagnosis not present

## 2019-09-16 DIAGNOSIS — M25561 Pain in right knee: Secondary | ICD-10-CM | POA: Diagnosis not present

## 2019-09-18 DIAGNOSIS — Z96651 Presence of right artificial knee joint: Secondary | ICD-10-CM | POA: Diagnosis not present

## 2019-10-05 DIAGNOSIS — M25561 Pain in right knee: Secondary | ICD-10-CM | POA: Diagnosis not present

## 2019-10-05 DIAGNOSIS — Z96651 Presence of right artificial knee joint: Secondary | ICD-10-CM | POA: Diagnosis not present

## 2019-10-09 DIAGNOSIS — M1711 Unilateral primary osteoarthritis, right knee: Secondary | ICD-10-CM | POA: Diagnosis not present

## 2019-10-09 DIAGNOSIS — M25561 Pain in right knee: Secondary | ICD-10-CM | POA: Diagnosis not present

## 2019-10-09 DIAGNOSIS — Z96651 Presence of right artificial knee joint: Secondary | ICD-10-CM | POA: Diagnosis not present

## 2019-12-15 DIAGNOSIS — E782 Mixed hyperlipidemia: Secondary | ICD-10-CM | POA: Diagnosis not present

## 2019-12-21 ENCOUNTER — Other Ambulatory Visit: Payer: Self-pay

## 2019-12-21 ENCOUNTER — Emergency Department
Admission: EM | Admit: 2019-12-21 | Discharge: 2019-12-21 | Disposition: A | Discharge status: Home / Self Care | Payer: Medicare HMO | Attending: Emergency Medicine | Admitting: Emergency Medicine

## 2019-12-21 ENCOUNTER — Emergency Department: Payer: Medicare HMO

## 2019-12-21 DIAGNOSIS — Y939 Activity, unspecified: Secondary | ICD-10-CM | POA: Diagnosis not present

## 2019-12-21 DIAGNOSIS — Z85828 Personal history of other malignant neoplasm of skin: Secondary | ICD-10-CM | POA: Insufficient documentation

## 2019-12-21 DIAGNOSIS — W19XXXA Unspecified fall, initial encounter: Secondary | ICD-10-CM

## 2019-12-21 DIAGNOSIS — Y92410 Unspecified street and highway as the place of occurrence of the external cause: Secondary | ICD-10-CM | POA: Insufficient documentation

## 2019-12-21 DIAGNOSIS — Y999 Unspecified external cause status: Secondary | ICD-10-CM | POA: Insufficient documentation

## 2019-12-21 DIAGNOSIS — R52 Pain, unspecified: Secondary | ICD-10-CM | POA: Diagnosis not present

## 2019-12-21 DIAGNOSIS — R0602 Shortness of breath: Secondary | ICD-10-CM | POA: Diagnosis not present

## 2019-12-21 DIAGNOSIS — S0990XA Unspecified injury of head, initial encounter: Secondary | ICD-10-CM | POA: Diagnosis not present

## 2019-12-21 DIAGNOSIS — R0689 Other abnormalities of breathing: Secondary | ICD-10-CM | POA: Diagnosis not present

## 2019-12-21 DIAGNOSIS — S199XXA Unspecified injury of neck, initial encounter: Secondary | ICD-10-CM | POA: Diagnosis not present

## 2019-12-21 DIAGNOSIS — M5489 Other dorsalgia: Secondary | ICD-10-CM | POA: Diagnosis not present

## 2019-12-21 MED ORDER — HYDROCODONE-ACETAMINOPHEN 5-325 MG PO TABS: 1.0000 | ORAL_TABLET | Freq: Four times a day (QID) | ORAL | 0 refills | Status: DC | PRN

## 2019-12-21 MED ORDER — ACETAMINOPHEN 325 MG PO TABS
650.0000 mg | ORAL_TABLET | Freq: Once | ORAL | Status: AC
  Administered 2019-12-21: 650 mg via ORAL
  Filled 2019-12-21: qty 2

## 2019-12-21 MED ORDER — HYDROCODONE-ACETAMINOPHEN 5-325 MG PO TABS
1.0000 | ORAL_TABLET | Freq: Once | ORAL | Status: AC
  Administered 2019-12-21: 1 via ORAL
  Filled 2019-12-21: qty 1
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
## 2019-12-24 DIAGNOSIS — M542 Cervicalgia: Secondary | ICD-10-CM | POA: Diagnosis not present

## 2020-01-05 DIAGNOSIS — M5136 Other intervertebral disc degeneration, lumbar region: Secondary | ICD-10-CM | POA: Diagnosis not present

## 2020-01-05 DIAGNOSIS — M5416 Radiculopathy, lumbar region: Secondary | ICD-10-CM | POA: Diagnosis not present

## 2020-01-05 DIAGNOSIS — M48062 Spinal stenosis, lumbar region with neurogenic claudication: Secondary | ICD-10-CM | POA: Diagnosis not present

## 2020-06-24 DIAGNOSIS — E782 Mixed hyperlipidemia: Secondary | ICD-10-CM | POA: Diagnosis not present

## 2020-07-01 DIAGNOSIS — Z0000 Encounter for general adult medical examination without abnormal findings: Secondary | ICD-10-CM | POA: Diagnosis not present

## 2020-07-01 DIAGNOSIS — R053 Chronic cough: Secondary | ICD-10-CM | POA: Diagnosis not present

## 2020-07-01 DIAGNOSIS — K449 Diaphragmatic hernia without obstruction or gangrene: Secondary | ICD-10-CM | POA: Diagnosis not present

## 2020-07-01 DIAGNOSIS — R059 Cough, unspecified: Secondary | ICD-10-CM | POA: Diagnosis not present

## 2020-07-01 DIAGNOSIS — F32A Depression, unspecified: Secondary | ICD-10-CM | POA: Diagnosis not present

## 2020-08-05 DIAGNOSIS — R0602 Shortness of breath: Secondary | ICD-10-CM | POA: Diagnosis not present

## 2020-08-05 DIAGNOSIS — U099 Post covid-19 condition, unspecified: Secondary | ICD-10-CM | POA: Diagnosis not present

## 2020-08-05 DIAGNOSIS — R0600 Dyspnea, unspecified: Secondary | ICD-10-CM | POA: Diagnosis not present

## 2020-08-15 DIAGNOSIS — U099 Post covid-19 condition, unspecified: Secondary | ICD-10-CM | POA: Diagnosis not present

## 2020-08-17 DIAGNOSIS — R0600 Dyspnea, unspecified: Secondary | ICD-10-CM | POA: Diagnosis not present

## 2020-08-23 DIAGNOSIS — R0600 Dyspnea, unspecified: Secondary | ICD-10-CM | POA: Diagnosis not present

## 2020-08-23 DIAGNOSIS — I499 Cardiac arrhythmia, unspecified: Secondary | ICD-10-CM | POA: Diagnosis not present

## 2020-08-31 ENCOUNTER — Other Ambulatory Visit
Admission: RE | Admit: 2020-08-31 | Discharge: 2020-08-31 | Disposition: A | Discharge status: Home / Self Care | Payer: Medicare HMO | Source: Ambulatory Visit | Attending: Physician Assistant | Admitting: Physician Assistant

## 2020-08-31 DIAGNOSIS — R609 Edema, unspecified: Secondary | ICD-10-CM | POA: Diagnosis not present

## 2020-08-31 DIAGNOSIS — R0602 Shortness of breath: Secondary | ICD-10-CM | POA: Diagnosis not present

## 2020-08-31 DIAGNOSIS — R002 Palpitations: Secondary | ICD-10-CM | POA: Diagnosis not present

## 2020-08-31 LAB — BRAIN NATRIURETIC PEPTIDE: B Natriuretic Peptide: 61.7 pg/mL (ref 0.0–100.0)

## 2020-09-16 DIAGNOSIS — G9001 Carotid sinus syncope: Secondary | ICD-10-CM | POA: Diagnosis not present

## 2020-09-16 DIAGNOSIS — R0602 Shortness of breath: Secondary | ICD-10-CM | POA: Diagnosis not present

## 2020-09-16 DIAGNOSIS — Z9889 Other specified postprocedural states: Secondary | ICD-10-CM | POA: Diagnosis not present

## 2020-09-16 DIAGNOSIS — E782 Mixed hyperlipidemia: Secondary | ICD-10-CM | POA: Diagnosis not present

## 2020-09-26 DIAGNOSIS — R079 Chest pain, unspecified: Secondary | ICD-10-CM | POA: Diagnosis not present

## 2020-09-26 DIAGNOSIS — R609 Edema, unspecified: Secondary | ICD-10-CM | POA: Diagnosis not present

## 2020-09-26 DIAGNOSIS — R0602 Shortness of breath: Secondary | ICD-10-CM | POA: Diagnosis not present

## 2020-09-26 DIAGNOSIS — Z79899 Other long term (current) drug therapy: Secondary | ICD-10-CM | POA: Diagnosis not present

## 2020-09-26 DIAGNOSIS — E782 Mixed hyperlipidemia: Secondary | ICD-10-CM | POA: Diagnosis not present

## 2020-09-27 DIAGNOSIS — R079 Chest pain, unspecified: Secondary | ICD-10-CM | POA: Diagnosis not present

## 2020-09-27 DIAGNOSIS — I4891 Unspecified atrial fibrillation: Secondary | ICD-10-CM | POA: Diagnosis not present

## 2020-09-27 DIAGNOSIS — K219 Gastro-esophageal reflux disease without esophagitis: Secondary | ICD-10-CM | POA: Diagnosis not present

## 2020-09-27 DIAGNOSIS — R0602 Shortness of breath: Secondary | ICD-10-CM | POA: Diagnosis not present

## 2020-09-27 DIAGNOSIS — Z79899 Other long term (current) drug therapy: Secondary | ICD-10-CM | POA: Diagnosis not present

## 2020-09-27 DIAGNOSIS — R0600 Dyspnea, unspecified: Secondary | ICD-10-CM | POA: Diagnosis not present

## 2020-09-27 DIAGNOSIS — R609 Edema, unspecified: Secondary | ICD-10-CM | POA: Diagnosis not present

## 2020-09-27 DIAGNOSIS — E782 Mixed hyperlipidemia: Secondary | ICD-10-CM | POA: Diagnosis not present

## 2020-11-01 DIAGNOSIS — R0602 Shortness of breath: Secondary | ICD-10-CM | POA: Diagnosis not present

## 2020-11-01 DIAGNOSIS — R079 Chest pain, unspecified: Secondary | ICD-10-CM | POA: Diagnosis not present

## 2020-11-01 DIAGNOSIS — R609 Edema, unspecified: Secondary | ICD-10-CM | POA: Diagnosis not present

## 2020-11-08 DIAGNOSIS — Z9889 Other specified postprocedural states: Secondary | ICD-10-CM | POA: Diagnosis not present

## 2020-11-08 DIAGNOSIS — R55 Syncope and collapse: Secondary | ICD-10-CM | POA: Diagnosis not present

## 2020-11-08 DIAGNOSIS — E782 Mixed hyperlipidemia: Secondary | ICD-10-CM | POA: Diagnosis not present

## 2020-11-15 ENCOUNTER — Other Ambulatory Visit
Admission: RE | Admit: 2020-11-15 | Discharge: 2020-11-15 | Disposition: A | Discharge status: Home / Self Care | Payer: Medicare HMO | Source: Ambulatory Visit | Attending: Pulmonary Disease | Admitting: Pulmonary Disease

## 2020-11-15 DIAGNOSIS — R0600 Dyspnea, unspecified: Secondary | ICD-10-CM | POA: Diagnosis not present

## 2020-11-15 LAB — D-DIMER, QUANTITATIVE: D-Dimer, Quant: 0.63 ug/mL-FEU — ABNORMAL HIGH (ref 0.00–0.50)

## 2020-11-25 ENCOUNTER — Other Ambulatory Visit: Payer: Self-pay | Admitting: Pulmonary Disease

## 2020-11-25 DIAGNOSIS — R0600 Dyspnea, unspecified: Secondary | ICD-10-CM

## 2020-12-02 DIAGNOSIS — R0600 Dyspnea, unspecified: Secondary | ICD-10-CM | POA: Diagnosis not present

## 2020-12-02 DIAGNOSIS — M778 Other enthesopathies, not elsewhere classified: Secondary | ICD-10-CM | POA: Diagnosis not present

## 2020-12-05 DIAGNOSIS — K449 Diaphragmatic hernia without obstruction or gangrene: Secondary | ICD-10-CM | POA: Diagnosis not present

## 2020-12-05 DIAGNOSIS — R1319 Other dysphagia: Secondary | ICD-10-CM | POA: Diagnosis not present

## 2020-12-05 DIAGNOSIS — R0600 Dyspnea, unspecified: Secondary | ICD-10-CM | POA: Diagnosis not present

## 2020-12-05 DIAGNOSIS — R140 Abdominal distension (gaseous): Secondary | ICD-10-CM | POA: Diagnosis not present

## 2020-12-05 DIAGNOSIS — K222 Esophageal obstruction: Secondary | ICD-10-CM | POA: Diagnosis not present

## 2020-12-05 DIAGNOSIS — R6881 Early satiety: Secondary | ICD-10-CM | POA: Diagnosis not present

## 2020-12-05 DIAGNOSIS — K219 Gastro-esophageal reflux disease without esophagitis: Secondary | ICD-10-CM | POA: Diagnosis not present

## 2020-12-13 ENCOUNTER — Other Ambulatory Visit: Payer: Self-pay

## 2020-12-13 ENCOUNTER — Ambulatory Visit
Admission: RE | Admit: 2020-12-13 | Discharge: 2020-12-13 | Disposition: A | Discharge status: Home / Self Care | Payer: Medicare HMO | Source: Ambulatory Visit | Attending: Pulmonary Disease | Admitting: Pulmonary Disease

## 2020-12-13 DIAGNOSIS — R0600 Dyspnea, unspecified: Secondary | ICD-10-CM | POA: Diagnosis not present

## 2020-12-13 DIAGNOSIS — R0602 Shortness of breath: Secondary | ICD-10-CM | POA: Diagnosis not present

## 2020-12-13 DIAGNOSIS — J9811 Atelectasis: Secondary | ICD-10-CM | POA: Diagnosis not present

## 2020-12-13 DIAGNOSIS — I700 Atherosclerosis of aorta: Secondary | ICD-10-CM | POA: Diagnosis not present

## 2020-12-13 LAB — POCT I-STAT CREATININE: Creatinine, Ser: 0.9 mg/dL (ref 0.44–1.00)

## 2020-12-13 MED ORDER — IOHEXOL 350 MG/ML SOLN
60.0000 mL | Freq: Once | INTRAVENOUS | Status: AC | PRN
  Administered 2020-12-13: 60 mL via INTRAVENOUS

## 2020-12-29 ENCOUNTER — Ambulatory Visit: Payer: Medicare HMO | Admitting: Anesthesiology

## 2020-12-29 ENCOUNTER — Encounter: Payer: Self-pay | Admitting: *Deleted

## 2020-12-29 ENCOUNTER — Encounter
Admission: RE | Disposition: A | Discharge status: Home / Self Care | Payer: Self-pay | Source: Home / Self Care | Attending: Gastroenterology

## 2020-12-29 ENCOUNTER — Ambulatory Visit
Admission: RE | Admit: 2020-12-29 | Discharge: 2020-12-29 | Disposition: A | Discharge status: Home / Self Care | Payer: Medicare HMO | Attending: Gastroenterology | Admitting: Gastroenterology

## 2020-12-29 DIAGNOSIS — K3184 Gastroparesis: Secondary | ICD-10-CM | POA: Diagnosis not present

## 2020-12-29 DIAGNOSIS — Z85828 Personal history of other malignant neoplasm of skin: Secondary | ICD-10-CM | POA: Insufficient documentation

## 2020-12-29 DIAGNOSIS — R140 Abdominal distension (gaseous): Secondary | ICD-10-CM | POA: Insufficient documentation

## 2020-12-29 DIAGNOSIS — R6881 Early satiety: Secondary | ICD-10-CM | POA: Diagnosis not present

## 2020-12-29 DIAGNOSIS — R0989 Other specified symptoms and signs involving the circulatory and respiratory systems: Secondary | ICD-10-CM | POA: Insufficient documentation

## 2020-12-29 DIAGNOSIS — K219 Gastro-esophageal reflux disease without esophagitis: Secondary | ICD-10-CM | POA: Diagnosis not present

## 2020-12-29 DIAGNOSIS — Z79899 Other long term (current) drug therapy: Secondary | ICD-10-CM | POA: Insufficient documentation

## 2020-12-29 DIAGNOSIS — T182XXA Foreign body in stomach, initial encounter: Secondary | ICD-10-CM | POA: Diagnosis not present

## 2020-12-29 DIAGNOSIS — Z7901 Long term (current) use of anticoagulants: Secondary | ICD-10-CM | POA: Diagnosis not present

## 2020-12-29 DIAGNOSIS — Z96651 Presence of right artificial knee joint: Secondary | ICD-10-CM | POA: Insufficient documentation

## 2020-12-29 DIAGNOSIS — E785 Hyperlipidemia, unspecified: Secondary | ICD-10-CM | POA: Diagnosis not present

## 2020-12-29 DIAGNOSIS — R1310 Dysphagia, unspecified: Secondary | ICD-10-CM | POA: Insufficient documentation

## 2020-12-29 DIAGNOSIS — K449 Diaphragmatic hernia without obstruction or gangrene: Secondary | ICD-10-CM | POA: Diagnosis not present

## 2020-12-29 HISTORY — PX: ESOPHAGOGASTRODUODENOSCOPY: SHX5428

## 2020-12-29 SURGERY — EGD (ESOPHAGOGASTRODUODENOSCOPY)
Anesthesia: General

## 2020-12-29 MED ORDER — PROPOFOL 500 MG/50ML IV EMUL
INTRAVENOUS | Status: DC | PRN
  Administered 2020-12-29: 125 ug/kg/min via INTRAVENOUS

## 2020-12-29 MED ORDER — SODIUM CHLORIDE 0.9 % IV SOLN: INTRAVENOUS | Status: DC

## 2020-12-29 MED ORDER — LIDOCAINE HCL (CARDIAC) PF 100 MG/5ML IV SOSY
PREFILLED_SYRINGE | INTRAVENOUS | Status: DC | PRN
  Administered 2020-12-29: 40 mg via INTRAVENOUS

## 2020-12-29 MED ORDER — PROPOFOL 10 MG/ML IV BOLUS
INTRAVENOUS | Status: DC | PRN
  Administered 2020-12-29: 50 mg via INTRAVENOUS

## 2020-12-29 NOTE — Op Note (Signed)
[facility] Gastroenterology Patient Name: Heidi Cross Procedure Date: 12/29/2020 8:57 AM MRN: [id number] Account #: [id number] Date of Birth: [birth year] Admit Type: Outpatient Age: 80 Room: [facility] ENDO ROOM 1 Gender: Female Note Status: Finalized Procedure:             Upper GI endoscopy Indications:           Dysphagia Providers:             Annamaria Helling DO, DO Referring MD:          Rusty Aus, MD (Referring MD) Medicines:             Monitored Anesthesia Care Complications:         No immediate complications. Estimated blood loss: None. Procedure:             Pre-Anesthesia Assessment:                        - Prior to the procedure, a History and Physical was                         performed, and patient medications and allergies were                         reviewed. The patient's tolerance of previous                         anesthesia was also reviewed. The risks and benefits                         of the procedure and the sedation options and risks                         were discussed with the patient. All questions were                         answered, and informed consent was obtained. Prior                         Anticoagulants: The patient has taken no previous                         anticoagulant or antiplatelet agents. ASA Grade                         Assessment: III - A patient with severe systemic                         disease. After reviewing the risks and benefits, the                         patient was deemed in satisfactory condition to                         undergo the procedure.                        - Prior Aspirin/ NSAID therapy: The patient has taken  no previous aspirin or NSAID medications.                        - Prophylactic Antibiotics: The patient does not                         require prophylactic antibiotics.                        - Sedation was administered by an anesthesia                          professional. The sedation level attained was moderate.                        After obtaining informed consent, the endoscope was                         passed under direct vision. Throughout the procedure,                         the patient's blood pressure, pulse, and oxygen                         saturations were monitored continuously. The Endoscope                         was introduced through the mouth, and advanced to the                         second part of duodenum. The upper GI endoscopy was                         accomplished without difficulty. The patient tolerated                         the procedure well. Findings:      Esophagus GEJ was 37 cm from the incisors; GEJ widely patent; 7 cm       hiatal hernia. Dilated with 15 and 37m balloon with no tearing on       endoscopic look.      A TTS dilator was passed through the scope. Dilation with a 15-16.5-18       mm balloon dilator was performed to 15 mm and 18 mm.      The stomach was normal. gastric pylorus widely patent; retained food and       medication throughout the stomach      The examined duodenum was normal.      A large hiatal hernia was present. Estimated blood loss: none.      Suspect gastroparesis due to retained gastric contents.      The examined duodenum was normal. Impression:            - Esophagus GEJ was 37 cm from the incisors; GEJ                         widely patent; 7 cm hiatal hernia. Dilated with 15 and  21m balloon with no tearing on endoscopic look.                        A TTS dilator was passed through the scope. Dilation                         with a 15-16.5-18 mm balloon dilator was performed to                         15 mm and 18 mm.                        - The stomach mucosa was normal. gastric pylorus                         widely patent; retained food and medication throughout                         the stomach                         - Normal examined duodenum.                        - Large hiatal hernia.                        - Gastroparesis- suspected.                        - Normal examined duodenum.                        - No specimens collected.                        - Large hernia.                        - Gastric retention. Recommendation:        - Discharge patient to home.                        - Patient has a contact number available for                         emergencies. The signs and symptoms of potential                         delayed complications were discussed with the patient.                         Return to normal activities tomorrow. Written                         discharge instructions were provided to the patient.                        - Soft diet. small, frequent meals; moisten and chew  food well.                        - Resume previous diet as tolerated.                        - Continue present medications.                        - No aspirin, ibuprofen, naproxen, or other                         non-steroidal anti-inflammatory drugs as possible Procedure Code(s):     --- Professional ---                        [id number], Esophagogastroduodenoscopy, flexible,                         transoral; with transendoscopic balloon dilation of                         esophagus (less than 30 mm diameter) Diagnosis Code(s):     --- Professional ---                        K44.9, Diaphragmatic hernia without obstruction or                         gangrene                        K31.84, Gastroparesis                        K31.89, Other diseases of stomach and duodenum                        R13.10, Dysphagia, unspecified CPT copyright 2019 [employer]. All rights reserved. The codes documented in this report are preliminary and upon coder review may  be revised to meet current compliance requirements. Attending Participation:      I personally  performed the entire procedure. Volney American, DO Annamaria Helling DO, DO 12/29/2020 9:55:17 AM This report has been signed electronically. Number of Addenda: 0 Note Initiated On: 12/29/2020 8:57 AM Estimated Blood Loss:  Estimated blood loss: none.      [facility]

## 2020-12-29 NOTE — H&P (Signed)
Gastroenterology Pre-Procedure H&P   Patient ID: Heidi Cross is a 80 y.o. female.  Gastroenterology Provider: Annamaria Helling, DO  Referring Provider: Laurine Blazer, PA  Date: 12/29/2020 9:52 AM  HPI 80 year old Caucasian female who presents for EGD today with symptoms of dysphagia with suprasternal and xiphoid sticking.  She has had longstanding reflux and is on Protonix 40 mg twice a day and Carafate.  She does describe a globus sensation and postprandial fullness with early satiety.  Last underwent EGD in 2019 where she was found to have a Schatzki's ring which underwent 38 French dilation savary.  Today she denies shortness of breath and chest pain.  She denies any weight loss nausea or vomiting.  Her dysphagia is solely to solids and carbonated beverages, but no issues with other liquids pills hot or cold foods.  She has previously been negative for Helicobacter pylori.  Past Medical History:  Diagnosis Date   Anemia    " when I was young and when I was pregnant "   Cancer ([place])    Basal Cell Carcinoma face   DDD (degenerative disc disease), lumbar    Depression    Environmental allergies    GERD (gastroesophageal reflux disease)    Headache    Hyperlipidemia    Insomnia    Mild atherosclerosis of both carotid arteries    Peripheral neuropathy    PONV (postoperative nausea and vomiting)    Shortness of breath dyspnea    with exertion   Spinal cord tumor    lumbar   Syncope    Urge incontinence    Wears contact lenses     Past Surgical History:  Procedure Laterality Date   ABDOMINAL HYSTERECTOMY     CHOLECYSTECTOMY     COLONOSCOPY W/ BIOPSIES AND POLYPECTOMY     x3   COLONOSCOPY WITH PROPOFOL N/A 09/02/2017   Procedure: COLONOSCOPY WITH PROPOFOL;  Surgeon: Manya Silvas, MD;  Location: [facility] ENDOSCOPY;  Service: Endoscopy;  Laterality: N/A;   DILATION AND CURETTAGE OF UTERUS     ESOPHAGOGASTRODUODENOSCOPY N/A 09/02/2017   Procedure:  ESOPHAGOGASTRODUODENOSCOPY (EGD);  Surgeon: Manya Silvas, MD;  Location: [facility] ENDOSCOPY;  Service: Endoscopy;  Laterality: N/A;   LAMINECTOMY N/A 11/03/2015   Procedure: L1-2 Laminectomy for resection of intradural tumor;  Surgeon: Newman Pies, MD;  Location: [place] NEURO ORS;  Service: Neurosurgery;  Laterality: N/A;  L1-2 Laminectomy for resection of intradural tumor   LEFT HEART CATH AND CORONARY ANGIOGRAPHY Left 06/06/2017   Procedure: LEFT HEART CATH AND CORONARY ANGIOGRAPHY;  Surgeon: Isaias Cowman, MD;  Location: [place] CV LAB;  Service: Cardiovascular;  Laterality: Left;   TONSILLECTOMY     TOTAL KNEE ARTHROPLASTY Right 08/13/2019   Procedure: RIGHT TOTAL KNEE ARTHROPLASTY;  Surgeon: Corky Mull, MD;  Location: [facility] ORS;  Service: Orthopedics;  Laterality: Right;    Family History No h/o GI disease or malignancy  Review of Systems  Constitutional:  Negative for activity change, appetite change, fatigue and fever.  HENT:  Positive for trouble swallowing. Negative for voice change.   Respiratory:  Negative for shortness of breath and wheezing.   Cardiovascular:  Negative for chest pain and palpitations.  Gastrointestinal:  Negative for abdominal distention, abdominal pain, blood in stool, constipation, diarrhea, nausea and vomiting.  Skin:  Negative for color change and pallor.  Neurological:  Negative for dizziness and weakness.  Psychiatric/Behavioral:  Negative for confusion.   All other systems reviewed and are negative.   Medications No  current facility-administered medications on file prior to encounter.   Current Outpatient Medications on File Prior to Encounter  Medication Sig Dispense Refill   acetaminophen (TYLENOL) 500 MG tablet Take 1,000 mg by mouth every 6 (six) hours as needed.     Calcium Carbonate-Vitamin D 500-125 MG-UNIT TABS Take 1 tablet by mouth every other day.     cyclobenzaprine (FLEXERIL) 10 MG tablet Take 10 mg by mouth 3 (three) times  daily as needed for muscle spasms.      famotidine (PEPCID) 40 MG tablet Take 40 mg by mouth at bedtime.     fluticasone (FLONASE) 50 MCG/ACT nasal spray Place 1 spray into both nostrils daily as needed for allergies or rhinitis.     gabapentin (NEURONTIN) 300 MG capsule Take 300-600 mg by mouth at bedtime.     Glucosamine-Chondroitin 500-400 MG CAPS Take 1 tablet by mouth daily.      Multiple Vitamins-Minerals (QC WOMENS DAILY MULTIVITAMIN) TABS Take 1 tablet by mouth daily.      pantoprazole (PROTONIX) 40 MG tablet Take 40 mg by mouth daily.     sucralfate (CARAFATE) 1 g tablet Take 1 g by mouth 4 (four) times daily -  with meals and at bedtime.     traMADol (ULTRAM) 50 MG tablet Take 1 tablet (50 mg total) by mouth every 6 (six) hours as needed for moderate pain. 30 tablet 0   traZODone (DESYREL) 50 MG tablet Take 50 mg by mouth at bedtime.     buPROPion (WELLBUTRIN XL) 150 MG 24 hr tablet Take 150 mg by mouth daily.     enoxaparin (LOVENOX) 40 MG/0.4ML injection Inject 0.4 mLs (40 mg total) into the skin daily. (Patient not taking: Reported on 12/29/2020) 5.6 mL 0   fexofenadine (ALLEGRA) 180 MG tablet Take 180 mg by mouth daily.  (Patient not taking: Reported on 12/29/2020)     HYDROcodone-acetaminophen (NORCO/VICODIN) 5-325 MG tablet Take 1 tablet by mouth every 6 (six) hours as needed for severe pain. (Patient not taking: Reported on 12/29/2020) 8 tablet 0   oxyCODONE (OXY IR/ROXICODONE) 5 MG immediate release tablet Take 1-2 tablets (5-10 mg total) by mouth every 4 (four) hours as needed for moderate pain (pain score 4-6). (Patient not taking: Reported on 12/29/2020) 60 tablet 0     Current Facility-Administered Medications:    0.9 %  sodium chloride infusion, , Intravenous, Continuous, Annamaria Helling, DO, Last Rate: 20 mL/hr at 12/29/20 0847, New Bag at 12/29/20 0847  sodium chloride 20 mL/hr at 12/29/20 0847       No Known Allergies   Objective    Vitals:   12/29/20 0835   BP: (!) 147/73  Pulse: 77  Resp: 18  Temp: (!) 96.7 F (35.9 C)  TempSrc: Temporal  SpO2: 100%  Weight: 90.7 kg  Height: '5\' 9"'$  (1.753 m)     Physical Exam Vitals reviewed.  Constitutional:      General: She is not in acute distress.    Appearance: Normal appearance. She is normal weight. She is not ill-appearing, toxic-appearing or diaphoretic.  HENT:     Head: Normocephalic and atraumatic.     Nose: Nose normal.     Mouth/Throat:     Mouth: Mucous membranes are dry.  Eyes:     General: No scleral icterus.    Extraocular Movements: Extraocular movements intact.  Cardiovascular:     Rate and Rhythm: Normal rate and regular rhythm.     Heart sounds: Normal heart sounds.  No murmur heard.   No friction rub. No gallop.  Pulmonary:     Effort: Pulmonary effort is normal. No respiratory distress.     Breath sounds: Normal breath sounds. No wheezing, rhonchi or rales.  Abdominal:     General: Abdomen is flat. Bowel sounds are normal. There is no distension.     Palpations: Abdomen is soft.     Tenderness: There is no abdominal tenderness. There is no guarding or rebound.  Musculoskeletal:     Cervical back: Neck supple.     Right lower leg: No edema.     Left lower leg: No edema.  Skin:    General: Skin is warm and dry.     Coloration: Skin is not jaundiced or pale.  Neurological:     General: No focal deficit present.     Mental Status: She is alert and oriented to person, place, and time. Mental status is at baseline.  Psychiatric:        Mood and Affect: Mood normal.        Behavior: Behavior normal.        Thought Content: Thought content normal.        Judgment: Judgment normal.     Assessment:  #dysphagia # globus # early eatiety and post prandial fullness # bloating # gerd # h/o schatski's ring  Plan:  Esophagogastroduodenoscopy with possible intervention and dilation today Npo since midnight  Esophagogastroduodenoscopy with possible biopsy, control  of bleeding, polypectomy, and interventions as necessary has been discussed with the patient. Informed consent was obtained from the patient after explaining the indication, nature, and risks of the procedure including but not limited to death, bleeding, perforation, missed neoplasm/lesions, cardiorespiratory compromise, and reaction to medications. Opportunity for questions was given and appropriate answers were provided. Patient has verbalized understanding is amenable to undergoing the procedure.    Annamaria Helling, DO  [facility] Gastroenterology  Portions of the record may have been created with voice recognition software. Occasional wrong-word or 'sound-a-like' substitutions may have occurred due to the inherent limitations of voice recognition software.  Read the chart carefully and recognize, using context, where substitutions may have occurred.

## 2020-12-29 NOTE — Anesthesia Postprocedure Evaluation (Signed)
Anesthesia Post Note  Patient: Heidi Cross  Procedure(s) Performed: ESOPHAGOGASTRODUODENOSCOPY (EGD)  Patient location during evaluation: Endoscopy Anesthesia Type: General Level of consciousness: awake and alert Pain management: pain level controlled Vital Signs Assessment: post-procedure vital signs reviewed and stable Respiratory status: spontaneous breathing, nonlabored ventilation, respiratory function stable and patient connected to nasal cannula oxygen Cardiovascular status: blood pressure returned to baseline and stable Postop Assessment: no apparent nausea or vomiting Anesthetic complications: no   No notable events documented.   Last Vitals:  Vitals:   12/29/20 1006 12/29/20 1016  BP: (!) 145/75 (!) 153/66  Pulse: 70 66  Resp: 16 17  Temp:    SpO2: 100% 98%    Last Pain:  Vitals:   12/29/20 1016  TempSrc:   PainSc: 0-No pain                 Precious Haws [place]

## 2020-12-29 NOTE — Transfer of Care (Signed)
Immediate Anesthesia Transfer of Care Note  Patient: Heidi Cross  Procedure(s) Performed: ESOPHAGOGASTRODUODENOSCOPY (EGD)  Patient Location: PACU  Anesthesia Type:General  Level of Consciousness: awake, alert  and oriented  Airway & Oxygen Therapy: Patient Spontanous Breathing  Post-op Assessment: Report given to RN and Post -op Vital signs reviewed and stable  Post vital signs: Reviewed and stable  Last Vitals:  Vitals Value Taken Time  BP 143/76 12/29/20 0946  Temp 35.9 C 12/29/20 0946  Pulse 70 12/29/20 0947  Resp 23 12/29/20 0947  SpO2 97 % 12/29/20 0947  Vitals shown include unvalidated device data.  Last Pain:  Vitals:   12/29/20 0946  TempSrc: Temporal  PainSc: 0-No pain         Complications: No notable events documented.

## 2020-12-29 NOTE — Interval H&P Note (Signed)
History and Physical Interval Note: H&P reviewed from 12/29/2020.  No interval changes.  Written consent was obtained for the patient and okay to proceed  12/29/2020 9:22 AM  Lowella Grip  has presented today for surgery, with the diagnosis of GERD W/ HIATAL HERNIA ESOPHAGEAL DYSPHAGIA POSTPRANDIAL ABDOMINAL BLOATING SCHATZKI'S RING.  The various methods of treatment have been discussed with the patient and family. After consideration of risks, benefits and other options for treatment, the patient has consented to  Procedure(s): ESOPHAGOGASTRODUODENOSCOPY (EGD) (N/A) as a surgical intervention.  The patient's history has been reviewed, patient examined, no change in status, stable for surgery.  I have reviewed the patient's chart and labs.  Questions were answered to the patient's satisfaction.     Annamaria Helling

## 2020-12-29 NOTE — Anesthesia Preprocedure Evaluation (Signed)
Anesthesia Evaluation  Patient identified by MRN, date of birth, ID band Patient awake    Reviewed: Allergy & Precautions, NPO status , Patient's Chart, lab work & pertinent test results  History of Anesthesia Complications (+) PONV and history of anesthetic complications  Airway Mallampati: III  TM Distance: <3 FB Neck ROM: limited    Dental  (+) Chipped, Poor Dentition, Missing   Pulmonary shortness of breath,    Pulmonary exam normal        Cardiovascular Exercise Tolerance: Good (-) angina(-) Past MI and (-) DOE negative cardio ROS Normal cardiovascular exam     Neuro/Psych  Headaches, PSYCHIATRIC DISORDERS  Neuromuscular disease    GI/Hepatic Neg liver ROS, GERD  Medicated and Controlled,  Endo/Other  negative endocrine ROS  Renal/GU negative Renal ROS  negative genitourinary   Musculoskeletal  (+) Arthritis ,   Abdominal   Peds  Hematology negative hematology ROS (+)   Anesthesia Other Findings Past Medical History: No date: Anemia     Comment:  " when I was young and when I was pregnant " No date: Cancer ([facility])     Comment:  Basal Cell Carcinoma face No date: DDD (degenerative disc disease), lumbar No date: Depression No date: Environmental allergies No date: GERD (gastroesophageal reflux disease) No date: Headache No date: Hyperlipidemia No date: Insomnia No date: Mild atherosclerosis of both carotid arteries No date: Peripheral neuropathy No date: PONV (postoperative nausea and vomiting) No date: Shortness of breath dyspnea     Comment:  with exertion No date: Spinal cord tumor     Comment:  lumbar No date: Syncope No date: Urge incontinence No date: Wears contact lenses  Past Surgical History: No date: ABDOMINAL HYSTERECTOMY No date: CHOLECYSTECTOMY No date: COLONOSCOPY W/ BIOPSIES AND POLYPECTOMY     Comment:  x3 09/02/2017: COLONOSCOPY WITH PROPOFOL; N/A     Comment:  Procedure:  COLONOSCOPY WITH PROPOFOL;  Surgeon: Manya Silvas, MD;  Location: [facility] ENDOSCOPY;  Service:               Endoscopy;  Laterality: N/A; No date: DILATION AND CURETTAGE OF UTERUS 09/02/2017: ESOPHAGOGASTRODUODENOSCOPY; N/A     Comment:  Procedure: ESOPHAGOGASTRODUODENOSCOPY (EGD);  Surgeon:               Manya Silvas, MD;  Location: [facility] ENDOSCOPY;                Service: Endoscopy;  Laterality: N/A; 11/03/2015: LAMINECTOMY; N/A     Comment:  Procedure: L1-2 Laminectomy for resection of intradural               tumor;  Surgeon: Newman Pies, MD;  Location: [place] NEURO              ORS;  Service: Neurosurgery;  Laterality: N/A;  L1-2               Laminectomy for resection of intradural tumor 06/06/2017: LEFT HEART CATH AND CORONARY ANGIOGRAPHY; Left     Comment:  Procedure: LEFT HEART CATH AND CORONARY ANGIOGRAPHY;                Surgeon: Isaias Cowman, MD;  Location: [place] CV LAB;  Service: Cardiovascular;  Laterality:               Left; No date: TONSILLECTOMY 08/13/2019:  TOTAL KNEE ARTHROPLASTY; Right     Comment:  Procedure: RIGHT TOTAL KNEE ARTHROPLASTY;  Surgeon:               Corky Mull, MD;  Location: [facility] ORS;  Service:               Orthopedics;  Laterality: Right;  BMI    Body Mass Index: 29.53 kg/m      Reproductive/Obstetrics negative OB ROS                             Anesthesia Physical Anesthesia Plan  ASA: 3  Anesthesia Plan: General   Post-op Pain Management:    Induction: Intravenous  PONV Risk Score and Plan: Propofol infusion and TIVA  Airway Management Planned: Natural Airway and Nasal Cannula  Additional Equipment:   Intra-op Plan:   Post-operative Plan:   Informed Consent: I have reviewed the patients History and Physical, chart, labs and discussed the procedure including the risks, benefits and alternatives for the proposed anesthesia with the patient or authorized  representative who has indicated his/her understanding and acceptance.     Dental Advisory Given  Plan Discussed with: Anesthesiologist, CRNA and Surgeon  Anesthesia Plan Comments: (Patient consented for risks of anesthesia including but not limited to:  - adverse reactions to medications - risk of airway placement if required - damage to eyes, teeth, lips or other oral mucosa - nerve damage due to positioning  - sore throat or hoarseness - Damage to heart, brain, nerves, lungs, other parts of body or loss of life  Patient voiced understanding.)        Anesthesia Quick Evaluation

## 2020-12-30 ENCOUNTER — Encounter: Payer: Self-pay | Admitting: Gastroenterology

## 2021-01-03 DIAGNOSIS — M5416 Radiculopathy, lumbar region: Secondary | ICD-10-CM | POA: Diagnosis not present

## 2021-01-03 DIAGNOSIS — K449 Diaphragmatic hernia without obstruction or gangrene: Secondary | ICD-10-CM | POA: Diagnosis not present

## 2021-02-08 DIAGNOSIS — K449 Diaphragmatic hernia without obstruction or gangrene: Secondary | ICD-10-CM | POA: Diagnosis not present

## 2021-02-08 DIAGNOSIS — K219 Gastro-esophageal reflux disease without esophagitis: Secondary | ICD-10-CM | POA: Diagnosis not present

## 2021-02-15 ENCOUNTER — Other Ambulatory Visit: Payer: Self-pay | Admitting: Surgery

## 2021-02-15 DIAGNOSIS — K449 Diaphragmatic hernia without obstruction or gangrene: Secondary | ICD-10-CM

## 2021-02-21 ENCOUNTER — Other Ambulatory Visit: Payer: Self-pay | Admitting: Surgery

## 2021-02-21 ENCOUNTER — Ambulatory Visit
Admission: RE | Admit: 2021-02-21 | Discharge: 2021-02-21 | Disposition: A | Discharge status: Home / Self Care | Payer: Medicare HMO | Source: Ambulatory Visit | Attending: Surgery | Admitting: Surgery

## 2021-02-21 DIAGNOSIS — K224 Dyskinesia of esophagus: Secondary | ICD-10-CM | POA: Diagnosis not present

## 2021-02-21 DIAGNOSIS — K219 Gastro-esophageal reflux disease without esophagitis: Secondary | ICD-10-CM | POA: Diagnosis not present

## 2021-02-21 DIAGNOSIS — K449 Diaphragmatic hernia without obstruction or gangrene: Secondary | ICD-10-CM

## 2021-03-03 DIAGNOSIS — W540XXA Bitten by dog, initial encounter: Secondary | ICD-10-CM | POA: Diagnosis not present

## 2021-03-03 DIAGNOSIS — S61452A Open bite of left hand, initial encounter: Secondary | ICD-10-CM | POA: Diagnosis not present

## 2021-03-14 ENCOUNTER — Ambulatory Visit: Payer: Self-pay | Admitting: Surgery

## 2021-03-14 NOTE — Progress Notes (Signed)
Sent message, via epic in basket, requesting orders in epic from surgeon.  

## 2021-03-15 NOTE — Patient Instructions (Signed)
DUE TO COVID-19 ONLY ONE VISITOR IS ALLOWED TO COME WITH YOU AND STAY IN THE WAITING ROOM ONLY DURING PRE OP AND PROCEDURE.   **NO VISITORS ARE ALLOWED IN THE SHORT STAY AREA OR RECOVERY ROOM!!**  IF YOU WILL BE ADMITTED INTO THE HOSPITAL YOU ARE ALLOWED ONLY TWO SUPPORT PEOPLE DURING VISITATION HOURS ONLY (7 AM -8PM)   The support person(s) must pass our screening, gel in and out, and wear a mask at all times, including in the patient's room. Patients must also wear a mask when staff or their support person are in the room. Visitors GUEST BADGE MUST BE WORN VISIBLY  One adult visitor may remain with you overnight and MUST be in the room by 8 P.M.  No visitors under the age of 23. Any visitor under the age of 70 must be accompanied by an adult.    COVID SWAB TESTING MUST BE COMPLETED ON:  03/31/21                    8A - 3P **MUST PRESENT COMPLETED FORM AT TESTING SITE**    [place] (backside of the building) You are not required to quarantine, however you are required to wear a well-fitted mask when you are out and around people not in your household.  Hand Hygiene often Do NOT share personal items Notify your provider if you are in close contact with someone who has COVID or you develop fever 100.4 or greater, new onset of sneezing, cough, sore throat, shortness of breath or body aches.  [address], [address], must go inside of the hospital, NOT A DRIVE THRU!  (Must self quarantine after testing. Follow instructions on handout.)       Your procedure is scheduled on:    Report to [place] Entrance    Report to short stay at: 5:15  AM   Call this number if you have problems the morning of surgery [phone]   Do not eat food :After Midnight.   May have liquids until : 4:14 AM   day of surgery  CLEAR LIQUID DIET  Foods Allowed                                                                      Foods Excluded  Water, Black Coffee and tea, regular and decaf                             liquids that you cannot  Plain Jell-O in any flavor  (No red)                                           see through such as: Fruit ices (not with fruit pulp)                                     milk, soups, orange juice  Iced Popsicles (No red)                                    All solid food                                   Apple juices Sports drinks like Gatorade (No red) Lightly seasoned clear broth or consume(fat free) Sugar  Sample Menu Breakfast                                Lunch                                     Supper Cranberry juice                    Beef broth                            Chicken broth Jell-O                                     Grape juice                           Apple juice Coffee or tea                        Jell-O                                      Popsicle                                                Coffee or tea                        Coffee or tea      Oral Hygiene is also important to reduce your risk of infection.                                    Remember - BRUSH YOUR TEETH THE MORNING OF SURGERY WITH YOUR REGULAR TOOTHPASTE   Do NOT smoke after Midnight   Take these medicines the morning of surgery with A SIP OF WATER: gabapentin,diltiazem,pantoprazole.  DO NOT TAKE ANY ORAL DIABETIC MEDICATIONS DAY OF YOUR SURGERY                              You may not have any metal on your body including hair pins, jewelry, and body piercing             Do not wear make-up, lotions, powders, perfumes/cologne, or deodorant  Do not wear nail polish including gel and S&S, artificial/acrylic  nails, or any other type of covering on natural nails including finger and toenails. If you have artificial nails, gel coating, etc. that needs to be removed by a nail salon please have this removed prior to surgery or surgery may need to be  canceled/ delayed if the surgeon/ anesthesia feels like they are unable to be safely monitored.   Do not shave  48 hours prior to surgery.    Do not bring valuables to the hospital. [place].   Contacts, dentures or bridgework may not be worn into surgery.   Bring small overnight bag day of surgery.    Patients discharged on the day of surgery will not be allowed to drive home.   Special Instructions: Bring a copy of your healthcare power of attorney and living will documents         the day of surgery if you haven't scanned them before.              Please read over the following fact sheets you were given: IF YOU HAVE QUESTIONS ABOUT YOUR PRE-OP INSTRUCTIONS PLEASE CALL [phone]     [facility] - Preparing for Surgery Before surgery, you can play an important role.  Because skin is not sterile, your skin needs to be as free of germs as possible.  You can reduce the number of germs on your skin by washing with CHG (chlorahexidine gluconate) soap before surgery.  CHG is an antiseptic cleaner which kills germs and bonds with the skin to continue killing germs even after washing. Please DO NOT use if you have an allergy to CHG or antibacterial soaps.  If your skin becomes reddened/irritated stop using the CHG and inform your nurse when you arrive at Short Stay. Do not shave (including legs and underarms) for at least 48 hours prior to the first CHG shower.  You may shave your face/neck. Please follow these instructions carefully:  1.  Shower with CHG Soap the night before surgery and the  morning of Surgery.  2.  If you choose to wash your hair, wash your hair first as usual with your  normal  shampoo.  3.  After you shampoo, rinse your hair and body thoroughly to remove the  shampoo.                           4.  Use CHG as you would any other liquid soap.  You can apply chg directly  to the skin and wash                       Gently with  a scrungie or clean washcloth.  5.  Apply the CHG Soap to your body ONLY FROM THE NECK DOWN.   Do not use on face/ open                           Wound or open sores. Avoid contact with eyes, ears mouth and genitals (private parts).                       Wash face,  Genitals (private parts) with your normal soap.             6.  Wash thoroughly, paying special attention to the area  where your surgery  will be performed.  7.  Thoroughly rinse your body with warm water from the neck down.  8.  DO NOT shower/wash with your normal soap after using and rinsing off  the CHG Soap.                9.  Pat yourself dry with a clean towel.            10.  Wear clean pajamas.            11.  Place clean sheets on your bed the night of your first shower and do not  sleep with pets. Day of Surgery : Do not apply any lotions/deodorants the morning of surgery.  Please wear clean clothes to the hospital/surgery center.  FAILURE TO FOLLOW THESE INSTRUCTIONS MAY RESULT IN THE CANCELLATION OF YOUR SURGERY PATIENT SIGNATURE_________________________________  NURSE SIGNATURE__________________________________  ________________________________________________________________________

## 2021-03-16 ENCOUNTER — Encounter (HOSPITAL_COMMUNITY)
Admission: RE | Admit: 2021-03-16 | Discharge: 2021-03-16 | Disposition: A | Discharge status: Home / Self Care | Payer: Medicare HMO | Source: Ambulatory Visit | Attending: Surgery | Admitting: Surgery

## 2021-03-16 ENCOUNTER — Encounter (HOSPITAL_COMMUNITY): Payer: Self-pay

## 2021-03-16 ENCOUNTER — Other Ambulatory Visit: Payer: Self-pay

## 2021-03-16 VITALS — BP 122/67 | HR 67 | Temp 97.6°F | Resp 18 | Ht 69.0 in | Wt 203.5 lb

## 2021-03-16 DIAGNOSIS — I2510 Atherosclerotic heart disease of native coronary artery without angina pectoris: Secondary | ICD-10-CM | POA: Diagnosis not present

## 2021-03-16 DIAGNOSIS — Z01818 Encounter for other preprocedural examination: Secondary | ICD-10-CM | POA: Insufficient documentation

## 2021-03-16 LAB — CBC
HCT: 35.9 % — ABNORMAL LOW (ref 36.0–46.0)
Hemoglobin: 11.7 g/dL — ABNORMAL LOW (ref 12.0–15.0)
MCH: 31.2 pg (ref 26.0–34.0)
MCHC: 32.6 g/dL (ref 30.0–36.0)
MCV: 95.7 fL (ref 80.0–100.0)
Platelets: 302 10*3/uL (ref 150–400)
RBC: 3.75 MIL/uL — ABNORMAL LOW (ref 3.87–5.11)
RDW: 13 % (ref 11.5–15.5)
WBC: 5.3 10*3/uL (ref 4.0–10.5)
nRBC: 0 % (ref 0.0–0.2)

## 2021-03-16 LAB — BASIC METABOLIC PANEL
Anion gap: 6 (ref 5–15)
BUN: 11 mg/dL (ref 8–23)
CO2: 26 mmol/L (ref 22–32)
Calcium: 8.8 mg/dL — ABNORMAL LOW (ref 8.9–10.3)
Chloride: 103 mmol/L (ref 98–111)
Creatinine, Ser: 0.92 mg/dL (ref 0.44–1.00)
GFR, Estimated: >60 mL/min (ref >60)
Glucose, Bld: 90 mg/dL (ref 70–99)
Potassium: 3.9 mmol/L (ref 3.5–5.1)
Sodium: 135 mmol/L (ref 135–145)

## 2021-03-16 NOTE — Progress Notes (Addendum)
COVID Vaccine Completed: Yes Date COVID Vaccine completed: 2021 x 3 COVID vaccine manufacturer: [employer] Test: 03/31/21 PCP - Dr. Emily Filbert  Cardiologist - Dr. Aris Georgia. LOV: 11/08/20 Pulmonologist: Ottie Glazier: LOV: 11/15/20 Chest x-ray - 12/13/20 EKG -  Stress Test -  ECHO - 08/17/20 [facility] Cardiac Cath - 06/06/17 [facility] Pacemaker/ICD device last checked:  Sleep Study -  CPAP -   Fasting Blood Sugar -  Checks Blood Sugar _____ times a day  Blood Thinner Instructions: Aspirin Instructions: Last Dose:  Anesthesia review: Hx: HTN,Syncope. Pt. Only walks short distances because she feels weak and tired.She denies any SOB.  Patient denies shortness of breath, fever, cough and chest pain at PAT appointment   Patient verbalized understanding of instructions that were given to them at the PAT appointment. Patient was also instructed that they will need to review over the PAT instructions again at home before surgery.

## 2021-03-23 NOTE — Progress Notes (Signed)
Anesthesia Chart Review   Case: [id number] Date/Time: 04/04/21 0700   Procedure: XI ROBOTIC ASSISTED HIATAL HERNIA REPAIR   Anesthesia type: General   Pre-op diagnosis: LARGE TYPE III MIXED HIATAL HERNIA   Location: [place] / [facility]   Surgeons: Johnathan Hausen, MD       DISCUSSION:80 y.o. never smoker with h/o PONV, GERD, large hiatal hernia scheduled for above procedure 04/04/2021 with Dr. Johnathan Hausen.   Chronic cough felt to be associated with large hiatal hernia.  Has been evaluated by pulmonology and cardiology.   Negative stress test 11/01/2020.   Echo 08/17/2020 with EF 55%, trivial pulmonic, mitral, and tricuspid valve regurgitation.   Anticipate pt can proceed with planned procedure barring acute status change.   VS: BP 122/67   Pulse 67   Temp 36.4 C (Oral)   Resp 18   Ht 5\' 9"  (1.753 m)   Wt 92.3 kg   SpO2 99%   BMI 30.05 kg/m   PROVIDERS: Rusty Aus, MD is PCP    LABS: Labs reviewed: Acceptable for surgery. (all labs ordered are listed, but only abnormal results are displayed)  Labs Reviewed  BASIC METABOLIC PANEL - Abnormal; Notable for the following components:      Result Value   Calcium 8.8 (*)    All other components within normal limits  CBC - Abnormal; Notable for the following components:   RBC 3.75 (*)    Hemoglobin 11.7 (*)    HCT 35.9 (*)    All other components within normal limits     IMAGES:   EKG: 03/16/2021 Rate 68 bpm  NSR RSR' or QR pattern in V1 suggests right ventricular conduction delay No significant change since last tracing   CV: Stress Test 11/01/2020 1.  Negative ETT  2.  Normal left ventricular function  3.  Normal wall motion  4.  Mild apical ischemia  Echo 08/17/2020 INTERPRETATION  NORMAL LEFT VENTRICULAR SYSTOLIC FUNCTION with Moderate LVH. Estimated EF =/< 55%  NORMAL RIGHT VENTRICULAR SYSTOLIC FUNCTION  Trivial Pulmonic; Mitral; and Tricuspid valve regurgitation  No valvular stenosis observed   Rhythm a fib with vent rate 110   Cardiac Cath 06/06/2017 1.  Normal coronary anatomy 2.  Normal left ventricular function Past Medical History:  Diagnosis Date   Anemia    " when I was young and when I was pregnant "   Cancer ([place])    Basal Cell Carcinoma face   DDD (degenerative disc disease), lumbar    Depression    Environmental allergies    GERD (gastroesophageal reflux disease)    Headache    Hyperlipidemia    Insomnia    Mild atherosclerosis of both carotid arteries    Peripheral neuropathy    PONV (postoperative nausea and vomiting)    Shortness of breath dyspnea    with exertion   Spinal cord tumor    lumbar   Syncope    Urge incontinence    Wears contact lenses     Past Surgical History:  Procedure Laterality Date   ABDOMINAL HYSTERECTOMY     CHOLECYSTECTOMY     COLONOSCOPY W/ BIOPSIES AND POLYPECTOMY     x3   COLONOSCOPY WITH PROPOFOL N/A 09/02/2017   Procedure: COLONOSCOPY WITH PROPOFOL;  Surgeon: Manya Silvas, MD;  Location: [facility];  Service: Endoscopy;  Laterality: N/A;   DILATION AND CURETTAGE OF UTERUS     ESOPHAGOGASTRODUODENOSCOPY N/A 09/02/2017   Procedure: ESOPHAGOGASTRODUODENOSCOPY (EGD);  Surgeon: Gaylyn Cheers  T, MD;  Location: [facility] ENDOSCOPY;  Service: Endoscopy;  Laterality: N/A;   ESOPHAGOGASTRODUODENOSCOPY N/A 12/29/2020   Procedure: ESOPHAGOGASTRODUODENOSCOPY (EGD);  Surgeon: Annamaria Helling, DO;  Location: [facility] ENDOSCOPY;  Service: Gastroenterology;  Laterality: N/A;   LAMINECTOMY N/A 11/03/2015   Procedure: L1-2 Laminectomy for resection of intradural tumor;  Surgeon: Newman Pies, MD;  Location: [place] NEURO ORS;  Service: Neurosurgery;  Laterality: N/A;  L1-2 Laminectomy for resection of intradural tumor   LEFT HEART CATH AND CORONARY ANGIOGRAPHY Left 06/06/2017   Procedure: LEFT HEART CATH AND CORONARY ANGIOGRAPHY;  Surgeon: Isaias Cowman, MD;  Location: [place] CV LAB;  Service: Cardiovascular;  Laterality:  Left;   TONSILLECTOMY     TOTAL KNEE ARTHROPLASTY Right 08/13/2019   Procedure: RIGHT TOTAL KNEE ARTHROPLASTY;  Surgeon: Corky Mull, MD;  Location: [facility] ORS;  Service: Orthopedics;  Laterality: Right;    MEDICATIONS:  Calcium Carbonate-Vitamin D 500-125 MG-UNIT TABS   cyclobenzaprine (FLEXERIL) 10 MG tablet   diltiazem (CARDIZEM CD) 120 MG 24 hr capsule   fluticasone (FLONASE) 50 MCG/ACT nasal spray   furosemide (LASIX) 20 MG tablet   gabapentin (NEURONTIN) 300 MG capsule   Glucosamine-Chondroitin 500-400 MG CAPS   montelukast (SINGULAIR) 10 MG tablet   Multiple Vitamins-Minerals (QC WOMENS DAILY MULTIVITAMIN) TABS   pantoprazole (PROTONIX) 40 MG tablet   PRESCRIPTION MEDICATION   sucralfate (CARAFATE) 1 g tablet   traMADol (ULTRAM) 50 MG tablet   traZODone (DESYREL) 50 MG tablet   venlafaxine XR (EFFEXOR-XR) 37.5 MG 24 hr capsule   No current facility-administered medications for this encounter.     Konrad Felix Ward, PA-C [facility]Surgical [facility] [phone]

## 2021-03-31 ENCOUNTER — Other Ambulatory Visit: Payer: Self-pay | Admitting: Surgery

## 2021-03-31 LAB — SARS CORONAVIRUS 2 (TAT 6-24 HRS): SARS Coronavirus 2: NEGATIVE

## 2021-04-03 NOTE — H&P (Signed)
REFERRING PHYSICIAN: Yevonne Pax, *  PROVIDER: Joya San, MD  MRN: [id number] DOB: 1940/12/23  Subjective   Chief Complaint: large hiatal hernia   History of Present Illness: Heidi Cross is a 80 y.o. female who is seen today as an office consultation at the request of Dr. Sabra Heck for evaluation of a large hiatal hernia. She has had progressive symptoms with reflux but recently has had more problems with pressure in her chest with some nocturnal reflux symptoms of coughing and choking at night. She has not had any aspiration pneumonia. She has noticed a decrease in her exercise tolerance and shortness of breath.  She recently underwent endoscopy which showed a Schatzki's ring and a at least 7 or 7 cm or larger hiatal hernia. She is on sucralfate and PPI. A lot of her symptoms or related to the mechanical aspects of a hiatal hernia and obstruction. I would like to repeat an upper GI series and then plan on taking her to the operating room for a robotic assisted type III hiatal hernia repair and possible fundoplication. She came with her husband and her granddaughter who is a Marine scientist and I went over this procedure with him step-by-step in some detail and gave them a Krames booklet about the surgery and the risk and complications. We also talked about the potential for recurrence.  Her granddaughter asked very good questions as well and help them to leave here with a good fund of knowledge regarding the surgical option. I would like to pursue this after October 8 which is when her granddaughter is getting married.  We will proceed with scheduling at [facility] with the XI robot  Review of Systems: See HPI as well for other ROS.  ROS   Medical History: Past Medical History:  Diagnosis Date   Basal cell carcinoma   DDD (degenerative disc disease), lumbar 06/14/2015   Degenerative disc disease at L5-S1 level   Depression   GERD (gastroesophageal reflux disease)    Hiatal hernia   Hyperlipidemia   Migraines   Mild recurrent major depression (CMS-HCC) 06/23/2018   Neuropathy   Urge incontinence   Patient Active Problem List  Diagnosis   Syncope   GERD (gastroesophageal reflux disease)   DDD (degenerative disc disease), lumbar   Migraine without aura and without status migrainosus, not intractable   Mixed hyperlipidemia   Medicare annual wellness visit, initial   H/O herpes zoster   S/P cardiac catheterization   Mild recurrent major depression (CMS-HCC)   Polyneuropathy due to medical condition (CMS-HCC)   Primary osteoarthritis of right knee   Status post total knee replacement using cement, right   Large hiatal hernia   Past Surgical History:  Procedure Laterality Date   CHOLECYSTECTOMY 2003   COLONOSCOPY 12/16/2002  Normal Colon   COLONOSCOPY 10/19/2005  Adenomatous Polyp   COLONOSCOPY 02/06/2011  PH Adenomatous Polyp   COLONOSCOPY 09/02/2017  PH Adenomatous Polyp: CBF 08/2022   EGD 10/23/2005  Normal   EGD 09/02/2017  Gastritis: No repeat per RTE   EGD 12/29/2020  Esophagus dilated/No repeat/SMR   HYSTERECTOMY   HYSTERECTOMY SUPRACERVICAL ABDOMINAL W/REMOVAL TUBES &/OR OVARIES   LAMINECTOMY LUMBAR SPINE  L1/L2, spinal cord tumor   NM CARDIAC STRESS TEST INTERPRETATION AND REPORT 03/12/2011   NM MYOCARDIAL PERFUSION SPECT MULTIPLE (STRESS AND REST) 03/12/2011   Right TKA using all-cemented [employer] Vanguard system with a 72.5 mm PCR femur, a 79 mm tibial tray with a 12 mm anterior stabilized E-poly insert, and  a 37 x 10 mm all-poly 3-pegged domed patella. Right 08/13/2019  Dr.Poggi    No Known Allergies  Current Outpatient Medications on File Prior to Visit  Medication Sig Dispense Refill   diltiazem (CARDIZEM CD) 120 MG XR capsule Take 1 capsule (120 mg total) by mouth once daily 30 capsule 11   gabapentin (NEURONTIN) 300 MG capsule Take 1 capsule (300 mg total) by mouth nightly 180 capsule 3   traZODone (DESYREL) 50 MG  tablet Take 1 tablet (50 mg total) by mouth nightly 90 tablet 3   venlafaxine (EFFEXOR-XR) 37.5 MG XR capsule Take 2 capsules (75 mg total) by mouth once daily 180 capsule 3   acetaminophen (TYLENOL) 500 MG tablet Take by mouth As needed daily (Patient not taking: Reported on 02/08/2021)   aspirin 81 MG EC tablet Take 81 mg by mouth once daily. (Patient not taking: Reported on 02/08/2021)   calcium carbonate-vitamin D3 (CALTRATE 600+D) 600 mg(1,500mg ) -200 unit tablet Take 1 tablet by mouth once daily. (Patient not taking: Reported on 02/08/2021)   Compound Medication Estriol 1 mg/gram insert pea size amount vaginally nightly for two weeks, every other night for two weeks, then twice a week for maintenance (Patient not taking: Reported on 02/08/2021) 1 each 2   cyclobenzaprine (FLEXERIL) 10 MG tablet TAKE 1 TABLET (10 MG TOTAL) BY MOUTH 2 (TWO) TIMES DAILY AS NEEDED FOR MUSCLE SPASMS (NECK) (Patient not taking: Reported on 02/08/2021) 60 tablet 6   ferrous gluconate (FERGON) 324 MG tablet TAKE 1 TABLET BY MOUTH DAILY WITH BREAKFAST (Patient not taking: Reported on 02/08/2021) 90 tablet 3   fluticasone propionate (FLONASE) 50 mcg/actuation nasal spray PLACE 1 SPRAY INTO BOTH NOSTRILS ONCE DAILY AS NEEDED (Patient not taking: Reported on 02/08/2021) 16 g 3   FUROsemide (LASIX) 20 MG tablet Take 1 tablet (20 mg total) by mouth once daily (Patient not taking: Reported on 02/08/2021) 30 tablet 11   glucosamine-chondroitin 500-400 mg capsule Take 1 capsule by mouth once daily. (Patient not taking: Reported on 02/08/2021)   montelukast (SINGULAIR) 10 mg tablet Take 1 tablet (10 mg total) by mouth once daily (Patient not taking: Reported on 02/08/2021) 90 tablet 3   multivitamin with iron (ONE DAILY) 18-0.4 mg Tab tablet Take 1 tablet by mouth once daily. (Patient not taking: Reported on 02/08/2021)   pantoprazole (PROTONIX) 40 MG DR tablet Take 1 tablet (40 mg total) by mouth 2 (two) times daily before meals (Patient not  taking: Reported on 02/08/2021) 180 tablet 3   sucralfate (CARAFATE) 1 gram tablet TAKE 1 TABLET (1 G TOTAL) BY MOUTH 4 (FOUR) TIMES DAILY (Patient not taking: Reported on 02/08/2021) 360 tablet 3   No current facility-administered medications on file prior to visit.   Family History  Problem Relation Age of Onset   Obesity Mother   High blood pressure (Hypertension) Mother   Hyperlipidemia (Elevated cholesterol) Mother   Skin cancer Father   Coronary Artery Disease (Blocked arteries around heart) Sister    Social History   Tobacco Use  Smoking Status Never Smoker  Smokeless Tobacco Never Used    Social History   Socioeconomic History   Marital status: Married  Occupational History   Occupation: retired  Tobacco Use   Smoking status: Never Smoker   Smokeless tobacco: Never Used  Scientific laboratory technician Use: Never used  Substance and Sexual Activity   Alcohol use: Yes  Alcohol/week: 4.0 standard drinks  Types: 4 Glasses of wine per week  Drug use: No   Sexual activity: Not Currently  Partners: Male  Birth control/protection: Surgical   Objective:   Vitals:  BP: 122/76  Pulse: 79  Temp: 36.4 C (97.6 F)  SpO2: 93%  Weight: 94.6 kg (208 lb 9.6 oz)  Height: 175.3 cm (5\' 9" )   Body mass index is 30.8 kg/m.  Physical Exam General: Well maintained lady in no acute distress. HEENT : Unremarkable-no bruits Chest: Clear Heart: Sinus rhythm without murmurs Breast: Not examined Abdomen: No prior upper abdominal surgery GU not examined Rectal not performed Extremities previous right total knee otherwise with good results and good range of motion Neuro alert and oriented x3. Motor and sensory function grossly intact  Labs, Imaging and Diagnostic Testing: I reviewed her reports of her endoscopy. I plan to get an upper GI series  Assessment and Plan:  Diagnoses and all orders for this visit:  Large hiatal hernia  Gastroesophageal reflux disease, unspecified  whether esophagitis present    UGI reviewed by me.  Questions answered in holding and ready to go.    Heidi Cross Donia Pounds, MD

## 2021-04-03 NOTE — Anesthesia Preprocedure Evaluation (Addendum)
Anesthesia Evaluation  Patient identified by MRN, date of birth, ID band Patient awake    Reviewed: Allergy & Precautions, NPO status , Patient's Chart, lab work & pertinent test results  History of Anesthesia Complications (+) PONV  Airway Mallampati: II  TM Distance: >3 FB Neck ROM: Full    Dental no notable dental hx.    Pulmonary shortness of breath and with exertion,    Pulmonary exam normal        Cardiovascular hypertension, Pt. on medications  Rhythm:Regular Rate:Normal     Neuro/Psych  Headaches, Depression    GI/Hepatic Neg liver ROS, GERD  ,Hiatal hernia    Endo/Other  negative endocrine ROS  Renal/GU negative Renal ROS     Musculoskeletal  (+) Arthritis , Osteoarthritis,    Abdominal Normal abdominal exam  (+)   Peds  Hematology  (+) anemia ,   Anesthesia Other Findings   Reproductive/Obstetrics                            Anesthesia Physical Anesthesia Plan  ASA: 2  Anesthesia Plan: General   Post-op Pain Management:    Induction: Intravenous  PONV Risk Score and Plan: 4 or greater and Ondansetron, Aprepitant, Treatment may vary due to age or medical condition, Dexamethasone and Amisulpride  Airway Management Planned: Mask and Oral ETT  Additional Equipment: None  Intra-op Plan:   Post-operative Plan: Extubation in OR  Informed Consent: I have reviewed the patients History and Physical, chart, labs and discussed the procedure including the risks, benefits and alternatives for the proposed anesthesia with the patient or authorized representative who has indicated his/her understanding and acceptance.     Dental advisory given  Plan Discussed with: CRNA  Anesthesia Plan Comments: (Lab Results      Component                Value               Date                      WBC                      5.3                 03/16/2021                HGB                       11.7 (L)            03/16/2021                HCT                      35.9 (L)            03/16/2021                MCV                      95.7                03/16/2021                PLT  302                 03/16/2021           Lab Results      Component                Value               Date                      NA                       135                 03/16/2021                K                        3.9                 03/16/2021                CO2                      26                  03/16/2021                GLUCOSE                  90                  03/16/2021                BUN                      11                  03/16/2021                CREATININE               0.92                03/16/2021                CALCIUM                  8.8 (L)             03/16/2021                GFRNONAA                 >60                 03/16/2021          )       Anesthesia Quick Evaluation

## 2021-04-04 ENCOUNTER — Encounter (HOSPITAL_COMMUNITY)
Admission: RE | Disposition: A | Discharge status: Home / Self Care | Payer: Self-pay | Source: Ambulatory Visit | Attending: Surgery

## 2021-04-04 ENCOUNTER — Other Ambulatory Visit: Payer: Self-pay

## 2021-04-04 ENCOUNTER — Encounter (HOSPITAL_COMMUNITY): Payer: Self-pay | Admitting: Surgery

## 2021-04-04 ENCOUNTER — Ambulatory Visit (HOSPITAL_COMMUNITY): Payer: Medicare HMO | Admitting: Physician Assistant

## 2021-04-04 ENCOUNTER — Observation Stay (HOSPITAL_COMMUNITY)
Admission: RE | Admit: 2021-04-04 | Discharge: 2021-04-05 | Disposition: A | Discharge status: Home / Self Care | Payer: Medicare HMO | Source: Ambulatory Visit | Attending: Surgery | Admitting: Surgery

## 2021-04-04 ENCOUNTER — Ambulatory Visit (HOSPITAL_COMMUNITY): Payer: Medicare HMO | Admitting: Anesthesiology

## 2021-04-04 DIAGNOSIS — K219 Gastro-esophageal reflux disease without esophagitis: Secondary | ICD-10-CM | POA: Diagnosis not present

## 2021-04-04 DIAGNOSIS — R1310 Dysphagia, unspecified: Secondary | ICD-10-CM | POA: Diagnosis not present

## 2021-04-04 DIAGNOSIS — Z7982 Long term (current) use of aspirin: Secondary | ICD-10-CM | POA: Diagnosis not present

## 2021-04-04 DIAGNOSIS — Z79899 Other long term (current) drug therapy: Secondary | ICD-10-CM | POA: Insufficient documentation

## 2021-04-04 DIAGNOSIS — Z85828 Personal history of other malignant neoplasm of skin: Secondary | ICD-10-CM | POA: Diagnosis not present

## 2021-04-04 DIAGNOSIS — Z9889 Other specified postprocedural states: Secondary | ICD-10-CM

## 2021-04-04 DIAGNOSIS — Z9189 Other specified personal risk factors, not elsewhere classified: Secondary | ICD-10-CM | POA: Diagnosis not present

## 2021-04-04 DIAGNOSIS — E785 Hyperlipidemia, unspecified: Secondary | ICD-10-CM | POA: Diagnosis not present

## 2021-04-04 DIAGNOSIS — Z96651 Presence of right artificial knee joint: Secondary | ICD-10-CM | POA: Diagnosis not present

## 2021-04-04 DIAGNOSIS — G629 Polyneuropathy, unspecified: Secondary | ICD-10-CM | POA: Diagnosis not present

## 2021-04-04 DIAGNOSIS — G4700 Insomnia, unspecified: Secondary | ICD-10-CM | POA: Diagnosis not present

## 2021-04-04 DIAGNOSIS — K449 Diaphragmatic hernia without obstruction or gangrene: Principal | ICD-10-CM | POA: Insufficient documentation

## 2021-04-04 HISTORY — PX: XI ROBOTIC ASSISTED HIATAL HERNIA REPAIR: SHX6889

## 2021-04-04 LAB — CBC
HCT: 32.7 % — ABNORMAL LOW (ref 36.0–46.0)
Hemoglobin: 10.7 g/dL — ABNORMAL LOW (ref 12.0–15.0)
MCH: 30.9 pg (ref 26.0–34.0)
MCHC: 32.7 g/dL (ref 30.0–36.0)
MCV: 94.5 fL (ref 80.0–100.0)
Platelets: 247 10*3/uL (ref 150–400)
RBC: 3.46 MIL/uL — ABNORMAL LOW (ref 3.87–5.11)
RDW: 13.6 % (ref 11.5–15.5)
WBC: 9.8 10*3/uL (ref 4.0–10.5)
nRBC: 0 % (ref 0.0–0.2)

## 2021-04-04 LAB — CREATININE, SERUM
Creatinine, Ser: 0.83 mg/dL (ref 0.44–1.00)
GFR, Estimated: >60 mL/min (ref >60)

## 2021-04-04 SURGERY — REPAIR, HERNIA, HIATAL, ROBOT-ASSISTED
Anesthesia: General

## 2021-04-04 MED ORDER — ORAL CARE MOUTH RINSE: 15.0000 mL | Freq: Once | OROMUCOSAL | Status: AC

## 2021-04-04 MED ORDER — SUGAMMADEX SODIUM 200 MG/2ML IV SOLN
INTRAVENOUS | Status: DC | PRN
  Administered 2021-04-04: 200 mg via INTRAVENOUS

## 2021-04-04 MED ORDER — ACETAMINOPHEN 500 MG PO TABS
1000.0000 mg | ORAL_TABLET | ORAL | Status: AC
  Administered 2021-04-04: 1000 mg via ORAL
  Filled 2021-04-04: qty 2

## 2021-04-04 MED ORDER — PROCHLORPERAZINE MALEATE 10 MG PO TABS
10.0000 mg | ORAL_TABLET | Freq: Four times a day (QID) | ORAL | Status: DC | PRN
  Filled 2021-04-04: qty 1

## 2021-04-04 MED ORDER — CHLORHEXIDINE GLUCONATE CLOTH 2 % EX PADS: 6.0000 | MEDICATED_PAD | Freq: Once | CUTANEOUS | Status: DC

## 2021-04-04 MED ORDER — ONDANSETRON 4 MG PO TBDP: 4.0000 mg | ORAL_TABLET | Freq: Four times a day (QID) | ORAL | Status: DC | PRN

## 2021-04-04 MED ORDER — SODIUM CHLORIDE (PF) 0.9 % IJ SOLN
INTRAMUSCULAR | Status: AC
  Filled 2021-04-04: qty 20

## 2021-04-04 MED ORDER — FENTANYL CITRATE (PF) 250 MCG/5ML IJ SOLN
INTRAMUSCULAR | Status: AC
  Filled 2021-04-04: qty 5

## 2021-04-04 MED ORDER — HEPARIN SODIUM (PORCINE) 5000 UNIT/ML IJ SOLN
5000.0000 [IU] | Freq: Once | INTRAMUSCULAR | Status: AC
  Administered 2021-04-04: 5000 [IU] via SUBCUTANEOUS
  Filled 2021-04-04: qty 1

## 2021-04-04 MED ORDER — OXYCODONE HCL 5 MG PO TABS
5.0000 mg | ORAL_TABLET | ORAL | Status: DC | PRN
  Administered 2021-04-04 – 2021-04-05 (×5): 10 mg via ORAL
  Filled 2021-04-04 (×5): qty 2

## 2021-04-04 MED ORDER — LIDOCAINE HCL (PF) 2 % IJ SOLN
INTRAMUSCULAR | Status: AC
  Filled 2021-04-04: qty 10

## 2021-04-04 MED ORDER — HEPARIN SODIUM (PORCINE) 5000 UNIT/ML IJ SOLN
5000.0000 [IU] | Freq: Three times a day (TID) | INTRAMUSCULAR | Status: DC
  Administered 2021-04-04 – 2021-04-05 (×3): 5000 [IU] via SUBCUTANEOUS
  Filled 2021-04-04 (×3): qty 1

## 2021-04-04 MED ORDER — FUROSEMIDE 20 MG PO TABS
20.0000 mg | ORAL_TABLET | Freq: Every day | ORAL | Status: DC
  Administered 2021-04-05: 20 mg via ORAL
  Filled 2021-04-04: qty 1

## 2021-04-04 MED ORDER — PROCHLORPERAZINE EDISYLATE 10 MG/2ML IJ SOLN: 5.0000 mg | Freq: Four times a day (QID) | INTRAMUSCULAR | Status: DC | PRN

## 2021-04-04 MED ORDER — KETAMINE HCL 50 MG/ML IJ SOLN
INTRAMUSCULAR | Status: DC | PRN
  Administered 2021-04-04: 20 mg via INTRAMUSCULAR

## 2021-04-04 MED ORDER — 0.9 % SODIUM CHLORIDE (POUR BTL) OPTIME
TOPICAL | Status: DC | PRN
  Administered 2021-04-04: 1000 mL

## 2021-04-04 MED ORDER — ONDANSETRON HCL 4 MG/2ML IJ SOLN: 4.0000 mg | Freq: Four times a day (QID) | INTRAMUSCULAR | Status: DC | PRN

## 2021-04-04 MED ORDER — MONTELUKAST SODIUM 10 MG PO TABS
10.0000 mg | ORAL_TABLET | Freq: Every day | ORAL | Status: DC
  Administered 2021-04-04: 10 mg via ORAL
  Filled 2021-04-04: qty 1

## 2021-04-04 MED ORDER — DEXAMETHASONE SODIUM PHOSPHATE 10 MG/ML IJ SOLN
INTRAMUSCULAR | Status: DC | PRN
  Administered 2021-04-04: 5 mg via INTRAVENOUS

## 2021-04-04 MED ORDER — TISSEEL VH 10 ML EX KIT
PACK | CUTANEOUS | Status: AC
  Filled 2021-04-04: qty 1

## 2021-04-04 MED ORDER — ROCURONIUM BROMIDE 10 MG/ML (PF) SYRINGE
PREFILLED_SYRINGE | INTRAVENOUS | Status: DC | PRN
  Administered 2021-04-04 (×2): 20 mg via INTRAVENOUS
  Administered 2021-04-04: 80 mg via INTRAVENOUS

## 2021-04-04 MED ORDER — FENTANYL CITRATE PF 50 MCG/ML IJ SOSY
PREFILLED_SYRINGE | INTRAMUSCULAR | Status: AC
  Administered 2021-04-04: 50 ug via INTRAVENOUS
  Filled 2021-04-04: qty 2

## 2021-04-04 MED ORDER — PROPOFOL 10 MG/ML IV BOLUS
INTRAVENOUS | Status: AC
  Filled 2021-04-04: qty 20

## 2021-04-04 MED ORDER — LIDOCAINE 2% (20 MG/ML) 5 ML SYRINGE
INTRAMUSCULAR | Status: DC | PRN
  Administered 2021-04-04: 1.5 mg/kg/h via INTRAVENOUS

## 2021-04-04 MED ORDER — OXYCODONE HCL 5 MG/5ML PO SOLN: 5.0000 mg | Freq: Once | ORAL | Status: DC | PRN

## 2021-04-04 MED ORDER — DILTIAZEM HCL ER COATED BEADS 120 MG PO CP24
120.0000 mg | ORAL_CAPSULE | Freq: Every day | ORAL | Status: DC
  Administered 2021-04-05: 120 mg via ORAL
  Filled 2021-04-04: qty 1

## 2021-04-04 MED ORDER — KETAMINE HCL 10 MG/ML IJ SOLN
INTRAMUSCULAR | Status: AC
  Filled 2021-04-04: qty 1

## 2021-04-04 MED ORDER — KCL IN DEXTROSE-NACL 20-5-0.45 MEQ/L-%-% IV SOLN
INTRAVENOUS | Status: DC
  Filled 2021-04-04 (×2): qty 1000

## 2021-04-04 MED ORDER — PANTOPRAZOLE SODIUM 40 MG IV SOLR
40.0000 mg | Freq: Every day | INTRAVENOUS | Status: DC
  Administered 2021-04-04: 40 mg via INTRAVENOUS
  Filled 2021-04-04: qty 40

## 2021-04-04 MED ORDER — LACTATED RINGERS IV SOLN
INTRAVENOUS | Status: DC | PRN
  Administered 2021-04-04: 1

## 2021-04-04 MED ORDER — FENTANYL CITRATE PF 50 MCG/ML IJ SOSY: 12.5000 ug | PREFILLED_SYRINGE | INTRAMUSCULAR | Status: DC | PRN

## 2021-04-04 MED ORDER — METOPROLOL TARTRATE 5 MG/5ML IV SOLN: 5.0000 mg | Freq: Four times a day (QID) | INTRAVENOUS | Status: DC | PRN

## 2021-04-04 MED ORDER — PROPOFOL 10 MG/ML IV BOLUS
INTRAVENOUS | Status: DC | PRN
  Administered 2021-04-04: 70 mg via INTRAVENOUS

## 2021-04-04 MED ORDER — ACETAMINOPHEN 10 MG/ML IV SOLN: 1000.0000 mg | Freq: Once | INTRAVENOUS | Status: DC | PRN

## 2021-04-04 MED ORDER — APREPITANT 40 MG PO CAPS
40.0000 mg | ORAL_CAPSULE | Freq: Once | ORAL | Status: AC
  Administered 2021-04-04: 40 mg via ORAL
  Filled 2021-04-04: qty 1

## 2021-04-04 MED ORDER — BUPIVACAINE LIPOSOME 1.3 % IJ SUSP
INTRAMUSCULAR | Status: AC
  Filled 2021-04-04: qty 20

## 2021-04-04 MED ORDER — SODIUM CHLORIDE 0.9 % IV SOLN
2.0000 g | INTRAVENOUS | Status: AC
  Administered 2021-04-04: 2 g via INTRAVENOUS
  Filled 2021-04-04: qty 2

## 2021-04-04 MED ORDER — LACTATED RINGERS IV SOLN: INTRAVENOUS | Status: DC

## 2021-04-04 MED ORDER — SODIUM CHLORIDE 0.9 % IV SOLN
2.0000 g | Freq: Two times a day (BID) | INTRAVENOUS | Status: AC
  Administered 2021-04-04: 2 g via INTRAVENOUS
  Filled 2021-04-04: qty 2

## 2021-04-04 MED ORDER — OXYCODONE HCL 5 MG PO TABS: 5.0000 mg | ORAL_TABLET | Freq: Once | ORAL | Status: DC | PRN

## 2021-04-04 MED ORDER — SCOPOLAMINE 1 MG/3DAYS TD PT72
1.0000 | MEDICATED_PATCH | TRANSDERMAL | Status: DC
  Administered 2021-04-04: 1.5 mg via TRANSDERMAL
  Filled 2021-04-04: qty 1

## 2021-04-04 MED ORDER — CHLORHEXIDINE GLUCONATE 0.12 % MT SOLN
15.0000 mL | Freq: Once | OROMUCOSAL | Status: AC
  Administered 2021-04-04: 15 mL via OROMUCOSAL

## 2021-04-04 MED ORDER — FENTANYL CITRATE PF 50 MCG/ML IJ SOSY
25.0000 ug | PREFILLED_SYRINGE | INTRAMUSCULAR | Status: DC | PRN
  Administered 2021-04-04: 50 ug via INTRAVENOUS

## 2021-04-04 MED ORDER — AMISULPRIDE (ANTIEMETIC) 5 MG/2ML IV SOLN: 10.0000 mg | Freq: Once | INTRAVENOUS | Status: DC | PRN

## 2021-04-04 MED ORDER — FLUTICASONE PROPIONATE 50 MCG/ACT NA SUSP
1.0000 | Freq: Every day | NASAL | Status: DC | PRN
  Filled 2021-04-04: qty 16

## 2021-04-04 MED ORDER — SODIUM CHLORIDE (PF) 0.9 % IJ SOLN
INTRAMUSCULAR | Status: DC | PRN
  Administered 2021-04-04: 10 mL

## 2021-04-04 MED ORDER — ONDANSETRON HCL 4 MG/2ML IJ SOLN
INTRAMUSCULAR | Status: DC | PRN
  Administered 2021-04-04: 4 mg via INTRAVENOUS

## 2021-04-04 MED ORDER — BUPIVACAINE LIPOSOME 1.3 % IJ SUSP
INTRAMUSCULAR | Status: DC | PRN
  Administered 2021-04-04: 20 mL

## 2021-04-04 MED ORDER — FENTANYL CITRATE (PF) 100 MCG/2ML IJ SOLN
INTRAMUSCULAR | Status: DC | PRN
  Administered 2021-04-04: 50 ug via INTRAVENOUS
  Administered 2021-04-04: 25 ug via INTRAVENOUS
  Administered 2021-04-04: 75 ug via INTRAVENOUS

## 2021-04-04 MED ORDER — LIDOCAINE 2% (20 MG/ML) 5 ML SYRINGE
INTRAMUSCULAR | Status: DC | PRN
  Administered 2021-04-04: 40 mg via INTRAVENOUS

## 2021-04-04 MED ORDER — BUPIVACAINE LIPOSOME 1.3 % IJ SUSP: 20.0000 mL | Freq: Once | INTRAMUSCULAR | Status: DC

## 2021-04-04 MED ORDER — ROCURONIUM BROMIDE 10 MG/ML (PF) SYRINGE
PREFILLED_SYRINGE | INTRAVENOUS | Status: AC
  Filled 2021-04-04: qty 50

## 2021-04-04 MED ORDER — FENTANYL CITRATE PF 50 MCG/ML IJ SOSY: 25.0000 ug | PREFILLED_SYRINGE | INTRAMUSCULAR | Status: DC | PRN

## 2021-04-04 SURGICAL SUPPLY — 61 items
APPLIER CLIP 5 13 M/L LIGAMAX5 (MISCELLANEOUS)
APPLIER CLIP ROT 13.4 12 LRG (CLIP)
BLADE SURG 15 STRL LF DISP TIS (BLADE) ×1 IMPLANT
BLADE SURG 15 STRL SS (BLADE) ×2
CLIP APPLIE 5 13 M/L LIGAMAX5 (MISCELLANEOUS) IMPLANT
CLIP APPLIE ROT 13.4 12 LRG (CLIP) IMPLANT
COVER SURGICAL LIGHT HANDLE (MISCELLANEOUS) ×3 IMPLANT
COVER TIP SHEARS 8 DVNC (MISCELLANEOUS) IMPLANT
COVER TIP SHEARS 8MM DA VINCI (MISCELLANEOUS)
DECANTER SPIKE VIAL GLASS SM (MISCELLANEOUS) ×3 IMPLANT
DERMABOND ADVANCED (GAUZE/BANDAGES/DRESSINGS) ×2
DERMABOND ADVANCED .7 DNX12 (GAUZE/BANDAGES/DRESSINGS) ×1 IMPLANT
DEVICE TROCAR PUNCTURE CLOSURE (ENDOMECHANICALS) IMPLANT
DRAIN PENROSE 0.5X18 (DRAIN) ×3 IMPLANT
DRAPE ARM DVNC X/XI (DISPOSABLE) ×4 IMPLANT
DRAPE COLUMN DVNC XI (DISPOSABLE) ×1 IMPLANT
DRAPE DA VINCI XI ARM (DISPOSABLE) ×8
DRAPE DA VINCI XI COLUMN (DISPOSABLE) ×2
ELECT REM PT RETURN 15FT ADLT (MISCELLANEOUS) ×3 IMPLANT
GAUZE 4X4 16PLY ~~LOC~~+RFID DBL (SPONGE) ×3 IMPLANT
GLOVE SURG ENC TEXT LTX SZ8 (GLOVE) ×6 IMPLANT
GOWN STRL REUS W/TWL XL LVL3 (GOWN DISPOSABLE) ×12 IMPLANT
GRASPER SUT TROCAR 14GX15 (MISCELLANEOUS) IMPLANT
IRRIG SUCT STRYKERFLOW 2 WTIP (MISCELLANEOUS) ×3
IRRIGATION SUCT STRKRFLW 2 WTP (MISCELLANEOUS) ×1 IMPLANT
KIT BASIN OR (CUSTOM PROCEDURE TRAY) ×3 IMPLANT
KIT TURNOVER KIT A (KITS) IMPLANT
MARKER SKIN DUAL TIP RULER LAB (MISCELLANEOUS) ×3 IMPLANT
NEEDLE HYPO 22GX1.5 SAFETY (NEEDLE) ×3 IMPLANT
OBTURATOR OPTICAL STANDARD 8MM (TROCAR) ×2
OBTURATOR OPTICAL STND 8 DVNC (TROCAR) ×1
OBTURATOR OPTICALSTD 8 DVNC (TROCAR) ×1 IMPLANT
PACK CARDIOVASCULAR III (CUSTOM PROCEDURE TRAY) ×3 IMPLANT
PAD POSITIONING PINK XL (MISCELLANEOUS) ×3 IMPLANT
SCISSORS LAP 5X45 EPIX DISP (ENDOMECHANICALS) IMPLANT
SEAL CANN UNIV 5-8 DVNC XI (MISCELLANEOUS) ×4 IMPLANT
SEAL XI 5MM-8MM UNIVERSAL (MISCELLANEOUS) ×8
SEALANT SURGICAL APPL DUAL CAN (MISCELLANEOUS) ×3 IMPLANT
SEALER VESSEL DA VINCI XI (MISCELLANEOUS) ×2
SEALER VESSEL EXT DVNC XI (MISCELLANEOUS) ×1 IMPLANT
SOL ANTI FOG 6CC (MISCELLANEOUS) ×1 IMPLANT
SOLUTION ANTI FOG 6CC (MISCELLANEOUS) ×2
SOLUTION ELECTROLUBE (MISCELLANEOUS) ×3 IMPLANT
SUT ETHIBOND 0 36 GRN (SUTURE) ×9 IMPLANT
SUT MNCRL AB 4-0 PS2 18 (SUTURE) ×6 IMPLANT
SUT VICRYL 0 UR6 27IN ABS (SUTURE) IMPLANT
SYR 10ML ECCENTRIC (SYRINGE) ×3 IMPLANT
SYR 20ML LL LF (SYRINGE) ×3 IMPLANT
TIP INNERVISION DETACH 40FR (MISCELLANEOUS) IMPLANT
TIP INNERVISION DETACH 50FR (MISCELLANEOUS) IMPLANT
TIP INNERVISION DETACH 56FR (MISCELLANEOUS) ×3 IMPLANT
TIPS INNERVISION DETACH 40FR (MISCELLANEOUS)
TOWEL OR 17X26 10 PK STRL BLUE (TOWEL DISPOSABLE) ×3 IMPLANT
TRAY FOLEY MTR SLVR 14FR STAT (SET/KITS/TRAYS/PACK) ×3 IMPLANT
TRAY FOLEY MTR SLVR 16FR STAT (SET/KITS/TRAYS/PACK) IMPLANT
TROCAR ADV FIXATION 12X100MM (TROCAR) ×3 IMPLANT
TROCAR ADV FIXATION 5X100MM (TROCAR) IMPLANT
TROCAR BLADELESS OPT 5 100 (ENDOMECHANICALS) ×3 IMPLANT
TUBING CONNECTING 10 (TUBING) IMPLANT
TUBING CONNECTING 10' (TUBING)
TUBING INSUFFLATION 10FT LAP (TUBING) ×3 IMPLANT

## 2021-04-04 NOTE — Op Note (Signed)
ALAIZA YAU  04-22-1941   04/04/2021    PCP:  Rusty Aus, MD   Surgeon: Kaylyn Lim, MD, FACS  Asst:  Romana Juniper, MD, FACS  Anes:  general  Preop Dx: Large type III hiatal hernia with dysphagia  Postop Dx: same  Procedure: Xi robotic takedown and repair of large type III hiatal hernia with repair of the diaphragm and Nissen fundoplication over a 73ZH lighted bougie Location Surgery: [facility] OR 2 Complications:  none  EBL:   minimal cc  Drains: none  Description of Procedure:  The patient was taken to OR 2 .  After anesthesia was administered and the patient was prepped  with chloroprep  and a timeout was performed.  Access to the abdomen was achieved with a 5 mm Optiview through the left upper quadrant without difficulty.  Following insufflation the four robotic trocars were placed and a 12 was placed on the right for Dr. Kae Heller.  The Sherrie Sport was placed in the upper midline and gave good exposure to the hiatus.  The sack excision was begun on the right and continued to the left and ultimately this produced the stomach below the diaphragm the under no tension.  A penrose was placed to facilitate this dissection.    The right and left crurae were approximated with two fig of 8 sutures of 0 ethibond and then a single ethibond.  The Nissen wrapped was then planned and laid out before the lighted 56 Fr. bougie was placed.  The wrap was completed with three 0 Ethibond sutures all tied intracorporally.  Tisseal was placed over the wrap.    A tap block on both sides was done before the robot was docked and the [place] site was infiltrated separately.  The skin was closed with 4-0 Monocryl and Dermabond.    The patient tolerated the procedure well and was taken to the PACU in stable condition.     Matt B. Hassell Done, [place], [facility] Surgery, [place]

## 2021-04-04 NOTE — Progress Notes (Signed)
RN called for SCD's but none available at this time.

## 2021-04-04 NOTE — Anesthesia Procedure Notes (Signed)
Procedure Name: Intubation Date/Time: 04/04/2021 7:46 AM Performed by: Gean Maidens, CRNA Pre-anesthesia Checklist: Patient identified, Emergency Drugs available, Suction available, Patient being monitored and Timeout performed Patient Re-evaluated:Patient Re-evaluated prior to induction Oxygen Delivery Method: Circle system utilized Preoxygenation: Pre-oxygenation with 100% oxygen Induction Type: IV induction Ventilation: Mask ventilation without difficulty Laryngoscope Size: Mac and 3 Grade View: Grade I Tube type: Oral Tube size: 7.0 mm Number of attempts: 1 Airway Equipment and Method: Stylet Placement Confirmation: ETT inserted through vocal cords under direct vision, positive ETCO2 and breath sounds checked- equal and bilateral Secured at: 21 cm Tube secured with: Tape Dental Injury: Teeth and Oropharynx as per pre-operative assessment

## 2021-04-04 NOTE — Transfer of Care (Signed)
Immediate Anesthesia Transfer of Care Note  Patient: MAYRE BURY  Procedure(s) Performed: XI ROBOTIC ASSISTED HIATAL HERNIA REPAIR WITH NISSEN FUNDOPLICATION  Patient Location: PACU  Anesthesia Type:General  Level of Consciousness: sedated, patient cooperative and responds to stimulation  Airway & Oxygen Therapy: Patient Spontanous Breathing and Patient connected to face mask oxygen  Post-op Assessment: Report given to RN and Post -op Vital signs reviewed and stable  Post vital signs: Reviewed and stable  Last Vitals:  Vitals Value Taken Time  BP 126/65 04/04/21 1023  Temp    Pulse 67 04/04/21 1025  Resp 19 04/04/21 1025  SpO2 95 % 04/04/21 1025  Vitals shown include unvalidated device data.  Last Pain:  Vitals:   04/04/21 0628  TempSrc: Oral  PainSc:          Complications: No notable events documented.

## 2021-04-04 NOTE — Anesthesia Postprocedure Evaluation (Signed)
Anesthesia Post Note  Patient: Heidi Cross  Procedure(s) Performed: XI ROBOTIC ASSISTED HIATAL HERNIA REPAIR WITH NISSEN FUNDOPLICATION     Patient location during evaluation: PACU Anesthesia Type: General Level of consciousness: awake and alert Pain management: pain level controlled Vital Signs Assessment: post-procedure vital signs reviewed and stable Respiratory status: spontaneous breathing, nonlabored ventilation, respiratory function stable and patient connected to nasal cannula oxygen Cardiovascular status: blood pressure returned to baseline and stable Postop Assessment: no apparent nausea or vomiting Anesthetic complications: no   No notable events documented.  Last Vitals:  Vitals:   04/04/21 1154 04/04/21 1259  BP: 135/70 (!) 148/67  Pulse: 66 71  Resp: 18 15  Temp: 36.6 C (!) 36.4 C  SpO2: 95% 97%    Last Pain:  Vitals:   04/04/21 1259  TempSrc: Oral  PainSc:                  March Rummage Jerol Rufener

## 2021-04-04 NOTE — Interval H&P Note (Signed)
History and Physical Interval Note:  04/04/2021 7:06 AM  Heidi Cross  has presented today for surgery, with the diagnosis of LARGE TYPE III MIXED HIATAL HERNIA.  The various methods of treatment have been discussed with the patient and family. After consideration of risks, benefits and other options for treatment, the patient has consented to  Procedure(s): XI ROBOTIC [place] (N/A) as a surgical intervention.  The patient's history has been reviewed, patient examined, no change in status, stable for surgery.  I have reviewed the patient's chart and labs.  Questions were answered to the patient's satisfaction.     Pedro Earls

## 2021-04-04 NOTE — Care Management Obs Status (Signed)
[place] NOTIFICATION   Patient Details  Name: Heidi Cross MRN: [id number] Date of Birth: 1940/11/04   Medicare Observation Status Notification Given:  Yes    Cecil Cobbs 04/04/2021, 4:29 PM

## 2021-04-04 NOTE — Care Management CC44 (Addendum)
Condition Code 44 Documentation Completed  Patient Details  Name: Heidi Cross MRN: [id number] Date of Birth: September 13, 1940   Condition Code 44 given:  Yes Patient signature on Condition Code 44 notice:  Yes Documentation of 2 MD's agreement:  Yes Code 44 added to claim:  Yes  Unable to talk with patient due to her sleeping, and unable to speak to husband, no answer and not able to leave a message.   Ross Ludwig, LCSW 04/04/2021, 4:29 PM

## 2021-04-05 ENCOUNTER — Encounter (HOSPITAL_COMMUNITY): Payer: Self-pay | Admitting: Surgery

## 2021-04-05 DIAGNOSIS — Z96651 Presence of right artificial knee joint: Secondary | ICD-10-CM | POA: Diagnosis not present

## 2021-04-05 DIAGNOSIS — Z85828 Personal history of other malignant neoplasm of skin: Secondary | ICD-10-CM | POA: Diagnosis not present

## 2021-04-05 DIAGNOSIS — K219 Gastro-esophageal reflux disease without esophagitis: Secondary | ICD-10-CM | POA: Diagnosis not present

## 2021-04-05 DIAGNOSIS — K449 Diaphragmatic hernia without obstruction or gangrene: Secondary | ICD-10-CM | POA: Diagnosis not present

## 2021-04-05 DIAGNOSIS — Z7982 Long term (current) use of aspirin: Secondary | ICD-10-CM | POA: Diagnosis not present

## 2021-04-05 DIAGNOSIS — Z79899 Other long term (current) drug therapy: Secondary | ICD-10-CM | POA: Diagnosis not present

## 2021-04-05 DIAGNOSIS — Z9189 Other specified personal risk factors, not elsewhere classified: Secondary | ICD-10-CM | POA: Diagnosis not present

## 2021-04-05 LAB — CBC
HCT: 28.1 % — ABNORMAL LOW (ref 36.0–46.0)
Hemoglobin: 9.4 g/dL — ABNORMAL LOW (ref 12.0–15.0)
MCH: 31.5 pg (ref 26.0–34.0)
MCHC: 33.5 g/dL (ref 30.0–36.0)
MCV: 94.3 fL (ref 80.0–100.0)
Platelets: 225 10*3/uL (ref 150–400)
RBC: 2.98 MIL/uL — ABNORMAL LOW (ref 3.87–5.11)
RDW: 13.5 % (ref 11.5–15.5)
WBC: 6.5 10*3/uL (ref 4.0–10.5)
nRBC: 0 % (ref 0.0–0.2)

## 2021-04-05 LAB — COMPREHENSIVE METABOLIC PANEL
ALT: 36 U/L (ref 0–44)
AST: 37 U/L (ref 15–41)
Albumin: 3 g/dL — ABNORMAL LOW (ref 3.5–5.0)
Alkaline Phosphatase: 63 U/L (ref 38–126)
Anion gap: 5 (ref 5–15)
BUN: 6 mg/dL — ABNORMAL LOW (ref 8–23)
CO2: 25 mmol/L (ref 22–32)
Calcium: 8.5 mg/dL — ABNORMAL LOW (ref 8.9–10.3)
Chloride: 102 mmol/L (ref 98–111)
Creatinine, Ser: 0.77 mg/dL (ref 0.44–1.00)
GFR, Estimated: >60 mL/min (ref >60)
Glucose, Bld: 138 mg/dL — ABNORMAL HIGH (ref 70–99)
Potassium: 4.2 mmol/L (ref 3.5–5.1)
Sodium: 132 mmol/L — ABNORMAL LOW (ref 135–145)
Total Bilirubin: 0.5 mg/dL (ref 0.3–1.2)
Total Protein: 5.3 g/dL — ABNORMAL LOW (ref 6.5–8.1)

## 2021-04-05 MED ORDER — HYDROCODONE-ACETAMINOPHEN 5-325 MG PO TABS: 1.0000 | ORAL_TABLET | Freq: Four times a day (QID) | ORAL | 0 refills | Status: DC | PRN

## 2021-04-05 MED ORDER — ONDANSETRON 4 MG PO TBDP: 4.0000 mg | ORAL_TABLET | Freq: Three times a day (TID) | ORAL | 0 refills | Status: DC | PRN

## 2021-04-05 MED ORDER — PANTOPRAZOLE SODIUM 40 MG PO TBEC
40.0000 mg | DELAYED_RELEASE_TABLET | Freq: Every day | ORAL | 0 refills | Status: AC
Start: 2021-04-05 — End: ?

## 2021-04-05 NOTE — Progress Notes (Signed)
Discharge instructions discussed with patient and family, verbalized agreement and understanding 

## 2021-04-05 NOTE — Progress Notes (Signed)
Verbal ordered by MD Hassell Done to discontinue foley

## 2021-04-05 NOTE — Discharge Summary (Signed)
Physician Discharge Summary  Patient ID: Heidi Cross MRN: [id number] DOB/AGE: 12-11-1940 80 y.o.  PCP: Rusty Aus, MD  Admit date: 04/04/2021 Discharge date: 04/05/2021  Admission Diagnoses:  hiatal hernia with GER  Discharge Diagnoses:  same  Principal Problem:   Status post Xi robotic Nissen fundoplication   Surgery:  Xi robotic hiatal hernia repair and Nissen fundoplication  Discharged Condition: improved  Hospital Course:   had surgery on Tuesday.  Begun on clears and advanced.  Wants to go home as her granddaughter is a Marine scientist and will help care for her.    Consults: none  Significant Diagnostic Studies: none    Discharge Exam: Blood pressure (!) 131/56, pulse 70, temperature 98.3 F (36.8 C), temperature source Oral, resp. rate 18, height 5\' 9"  (1.753 m), weight 92.3 kg, SpO2 95 %. Incisions OK  Disposition: Discharge disposition: 01-Home or Self Care       Discharge Instructions     Call MD for:  redness, tenderness, or signs of infection (pain, swelling, redness, odor or green/yellow discharge around incision site)   Complete by: As directed    Diet full liquid   Complete by: As directed    Full liquid diet for one week then pureed foods for 3 weeks.   Discharge instructions   Complete by: As directed    Reduce your Protonix to one a day and then taper and stop after ~ 2 weeks.  Thereafter, you can take if needed for occasional heartburn if present   Discharge wound care:   Complete by: As directed    Shower when you get home and let dermabond peel off   Increase activity slowly   Complete by: As directed       Allergies as of 04/05/2021   No Known Allergies      Medication List     STOP taking these medications    sucralfate 1 g tablet Commonly known as: CARAFATE   traMADol 50 MG tablet Commonly known as: ULTRAM       TAKE these medications    Calcium Carbonate-Vitamin D 500-125 MG-UNIT Tabs Take 1 tablet by mouth every  other day.   cyclobenzaprine 10 MG tablet Commonly known as: FLEXERIL Take 10 mg by mouth 2 (two) times daily as needed for muscle spasms.   diltiazem 120 MG 24 hr capsule Commonly known as: CARDIZEM CD Take 120 mg by mouth daily.   fluticasone 50 MCG/ACT nasal spray Commonly known as: FLONASE Place 1 spray into both nostrils daily as needed for allergies or rhinitis.   furosemide 20 MG tablet Commonly known as: LASIX Take 20 mg by mouth daily.   gabapentin 300 MG capsule Commonly known as: NEURONTIN Take 300-600 mg by mouth.   Glucosamine-Chondroitin 500-400 MG Caps Take 1 tablet by mouth daily.   HYDROcodone-acetaminophen 5-325 MG tablet Commonly known as: NORCO/VICODIN Take 1 tablet by mouth every 6 (six) hours as needed for moderate pain.   montelukast 10 MG tablet Commonly known as: SINGULAIR Take 10 mg by mouth at bedtime.   ondansetron 4 MG disintegrating tablet Commonly known as: Zofran ODT Take 1 tablet (4 mg total) by mouth every 8 (eight) hours as needed for nausea or vomiting.   pantoprazole 40 MG tablet Commonly known as: PROTONIX Take 1 tablet (40 mg total) by mouth daily. What changed: when to take this   [place] 1 application vaginally 2 (two) times a week. Compounded Med - Estriol 1 mg   QC  Womens Daily Multivitamin Tabs Take 1 tablet by mouth daily.   traZODone 50 MG tablet Commonly known as: DESYREL Take 50 mg by mouth at bedtime.   venlafaxine XR 37.5 MG 24 hr capsule Commonly known as: EFFEXOR-XR Take 37.5 mg by mouth daily with breakfast.               Discharge Care Instructions  (From admission, onward)           Start     Ordered   04/05/21 0000  Discharge wound care:       Comments: Shower when you get home and let dermabond peel off   04/05/21 0635             Signed: Pedro Earls 04/05/2021, 6:36 AM

## 2021-07-03 ENCOUNTER — Other Ambulatory Visit: Payer: Self-pay | Admitting: Surgery

## 2021-07-03 DIAGNOSIS — Z9889 Other specified postprocedural states: Secondary | ICD-10-CM

## 2021-07-07 ENCOUNTER — Other Ambulatory Visit: Payer: Self-pay

## 2021-07-07 ENCOUNTER — Ambulatory Visit
Admission: RE | Admit: 2021-07-07 | Discharge: 2021-07-07 | Disposition: A | Discharge status: Home / Self Care | Payer: Medicare Other | Source: Ambulatory Visit | Attending: Surgery | Admitting: Surgery

## 2021-07-07 DIAGNOSIS — Z9889 Other specified postprocedural states: Secondary | ICD-10-CM | POA: Diagnosis not present

## 2021-08-23 ENCOUNTER — Other Ambulatory Visit: Payer: Self-pay

## 2021-08-23 ENCOUNTER — Observation Stay
Admission: EM | Admit: 2021-08-23 | Discharge: 2021-08-25 | Disposition: A | Discharge status: Home / Self Care | Payer: Medicare Other | Attending: Internal Medicine | Admitting: Internal Medicine

## 2021-08-23 DIAGNOSIS — Z85828 Personal history of other malignant neoplasm of skin: Secondary | ICD-10-CM | POA: Insufficient documentation

## 2021-08-23 DIAGNOSIS — A0472 Enterocolitis due to Clostridium difficile, not specified as recurrent: Secondary | ICD-10-CM | POA: Diagnosis present

## 2021-08-23 DIAGNOSIS — Z96651 Presence of right artificial knee joint: Secondary | ICD-10-CM | POA: Diagnosis not present

## 2021-08-23 DIAGNOSIS — R55 Syncope and collapse: Secondary | ICD-10-CM | POA: Diagnosis present

## 2021-08-23 DIAGNOSIS — I10 Essential (primary) hypertension: Secondary | ICD-10-CM | POA: Insufficient documentation

## 2021-08-23 DIAGNOSIS — E876 Hypokalemia: Secondary | ICD-10-CM | POA: Diagnosis present

## 2021-08-23 DIAGNOSIS — Z79899 Other long term (current) drug therapy: Secondary | ICD-10-CM | POA: Diagnosis not present

## 2021-08-23 LAB — COMPREHENSIVE METABOLIC PANEL
ALT: 15 U/L (ref 0–44)
AST: 26 U/L (ref 15–41)
Albumin: 2.2 g/dL — ABNORMAL LOW (ref 3.5–5.0)
Alkaline Phosphatase: 91 U/L (ref 38–126)
Anion gap: 10 (ref 5–15)
BUN: 9 mg/dL (ref 8–23)
CO2: 22 mmol/L (ref 22–32)
Calcium: 7.9 mg/dL — ABNORMAL LOW (ref 8.9–10.3)
Chloride: 107 mmol/L (ref 98–111)
Creatinine, Ser: 0.74 mg/dL (ref 0.44–1.00)
GFR, Estimated: >60 mL/min (ref >60)
Glucose, Bld: 106 mg/dL — ABNORMAL HIGH (ref 70–99)
Potassium: 3 mmol/L — ABNORMAL LOW (ref 3.5–5.1)
Sodium: 139 mmol/L (ref 135–145)
Total Bilirubin: 0.5 mg/dL (ref 0.3–1.2)
Total Protein: 4.9 g/dL — ABNORMAL LOW (ref 6.5–8.1)

## 2021-08-23 LAB — URINALYSIS, COMPLETE (UACMP) WITH MICROSCOPIC
Bacteria, UA: NONE SEEN
Bilirubin Urine: NEGATIVE
Glucose, UA: NEGATIVE mg/dL
Hgb urine dipstick: NEGATIVE
Ketones, ur: NEGATIVE mg/dL
Leukocytes,Ua: NEGATIVE
Nitrite: NEGATIVE
Protein, ur: 30 mg/dL — AB
Specific Gravity, Urine: 1.019 (ref 1.005–1.030)
pH: 6 (ref 5.0–8.0)

## 2021-08-23 LAB — CBC WITH DIFFERENTIAL/PLATELET
Abs Immature Granulocytes: 0.03 10*3/uL (ref 0.00–0.07)
Basophils Absolute: 0 10*3/uL (ref 0.0–0.1)
Basophils Relative: 1 %
Eosinophils Absolute: 0.3 10*3/uL (ref 0.0–0.5)
Eosinophils Relative: 4 %
HCT: 32.6 % — ABNORMAL LOW (ref 36.0–46.0)
Hemoglobin: 10.3 g/dL — ABNORMAL LOW (ref 12.0–15.0)
Immature Granulocytes: 1 %
Lymphocytes Relative: 31 %
Lymphs Abs: 1.8 10*3/uL (ref 0.7–4.0)
MCH: 30.1 pg (ref 26.0–34.0)
MCHC: 31.6 g/dL (ref 30.0–36.0)
MCV: 95.3 fL (ref 80.0–100.0)
Monocytes Absolute: 0.5 10*3/uL (ref 0.1–1.0)
Monocytes Relative: 9 %
Neutro Abs: 3.2 10*3/uL (ref 1.7–7.7)
Neutrophils Relative %: 54 %
Platelets: 328 10*3/uL (ref 150–400)
RBC: 3.42 MIL/uL — ABNORMAL LOW (ref 3.87–5.11)
RDW: 15.8 % — ABNORMAL HIGH (ref 11.5–15.5)
WBC: 5.9 10*3/uL (ref 4.0–10.5)
nRBC: 0 % (ref 0.0–0.2)

## 2021-08-23 LAB — C DIFFICILE QUICK SCREEN W PCR REFLEX
C Diff antigen: POSITIVE — AB
C Diff toxin: NEGATIVE

## 2021-08-23 LAB — CLOSTRIDIUM DIFFICILE BY PCR, REFLEXED: Toxigenic C. Difficile by PCR: POSITIVE — AB

## 2021-08-23 LAB — TROPONIN I (HIGH SENSITIVITY): Troponin I (High Sensitivity): 7 ng/L (ref <18)

## 2021-08-23 MED ORDER — SODIUM CHLORIDE 0.9 % IV SOLN: Freq: Once | INTRAVENOUS | Status: AC

## 2021-08-23 MED ORDER — ACETAMINOPHEN 325 MG PO TABS: 650.0000 mg | ORAL_TABLET | Freq: Four times a day (QID) | ORAL | Status: DC | PRN

## 2021-08-23 MED ORDER — ONDANSETRON HCL 4 MG PO TABS: 4.0000 mg | ORAL_TABLET | Freq: Four times a day (QID) | ORAL | Status: DC | PRN

## 2021-08-23 MED ORDER — ONDANSETRON HCL 4 MG/2ML IJ SOLN: 4.0000 mg | Freq: Four times a day (QID) | INTRAMUSCULAR | Status: DC | PRN

## 2021-08-23 MED ORDER — FIDAXOMICIN 200 MG PO TABS
200.0000 mg | ORAL_TABLET | Freq: Two times a day (BID) | ORAL | Status: DC
  Administered 2021-08-23 – 2021-08-25 (×4): 200 mg via ORAL
  Filled 2021-08-23 (×5): qty 1

## 2021-08-23 MED ORDER — SODIUM CHLORIDE 0.9 % IV BOLUS
500.0000 mL | Freq: Once | INTRAVENOUS | Status: AC
  Administered 2021-08-23: 500 mL via INTRAVENOUS

## 2021-08-23 MED ORDER — LACTATED RINGERS IV SOLN: INTRAVENOUS | Status: AC

## 2021-08-23 MED ORDER — ENOXAPARIN SODIUM 60 MG/0.6ML IJ SOSY: 0.5000 mg/kg | PREFILLED_SYRINGE | INTRAMUSCULAR | Status: DC

## 2021-08-23 MED ORDER — TRAZODONE HCL 50 MG PO TABS
50.0000 mg | ORAL_TABLET | Freq: Every day | ORAL | Status: DC
  Administered 2021-08-24 (×2): 50 mg via ORAL
  Filled 2021-08-23 (×2): qty 1

## 2021-08-23 MED ORDER — ACETAMINOPHEN 650 MG RE SUPP: 650.0000 mg | Freq: Four times a day (QID) | RECTAL | Status: DC | PRN

## 2021-08-23 MED ORDER — POTASSIUM CHLORIDE CRYS ER 20 MEQ PO TBCR
40.0000 meq | EXTENDED_RELEASE_TABLET | Freq: Once | ORAL | Status: AC
  Administered 2021-08-23: 40 meq via ORAL
  Filled 2021-08-23: qty 2

## 2021-08-23 NOTE — H&P (Signed)
?History and Physical  ? ?Heidi Cross JJH:[id number] DOB: 07-19-1940 DOA: 08/23/2021 ? ?PCP: Heidi Aus, MD  ?Outpatient Specialists: Dr. Hassell Cross, general surgery history of hiatal hernia, ?Patient coming from: Home via EMS ? ?I have personally briefly reviewed patient's old medical records in [place]. ? ?Chief Concern: Near syncope ? ?HPI: [name] is a 81 year old female with hypertension, depression, GERD, hyperlipidemia, hiatal hernia status post robotic hiatal hernia repair and [employer] fundoplication in [id number], who presents emergency department for chief concerns of weakness and presyncope. ? ?Initial vitals in the emergency department showed temperature of 97.9, respiration rate 18, heart rate of 70, blood pressure 135/62, SPO2 of 95% on room air. ? ?Serum sodium was 139, potassium 3.0, chloride of 107, bicarb 22, BUN of 9, serum creatinine of 0.79, GFR greater than 60, nonfasting blood glucose 106, WBC 5.9, hemoglobin 10.3, platelets of 328.  UA was negative for leukocytes or nitrites. ? ?Patient's C. difficile test screen was positive and C. difficile PCR was positive.  High sensitive troponin was 7. ? ?ED treatment: Patient was started on Fidaxomicin 200 mg p.o. twice daily, for 10 days by EDP ? ?At bedside, she is able to tell me her name, age, current year.  ? ?She reports that earlier she felt weak and was not able to stand back up. She was sitting on side of her bed and denies actually hitting her head. She was conscious and called her husband for help when things went black.  ? ?She vomited about two times and it was red and she had pedialyte earlier. She denies blood or black vomitus.  ? ?She denies fever. She reports the diarrhea started again yesterday.  ? ?She reports she has lost about 30 pounds since surgery in November 2022.  ? ?Social history: She lives at home with her husband. She infrequently drinks etoh and has not had a drink since November. She is retired and formerly a  Dance movement psychotherapist.  ? ?Vaccination history: She endorses being vaccinated for covid and influenza. ? ?ROS: ?Constitutional: no weight change, no fever ?ENT/Mouth: no sore throat, no rhinorrhea ?Eyes: no eye pain, no vision changes ?Cardiovascular: no chest pain, no dyspnea,  no edema, no palpitations ?Respiratory: no cough, no sputum, no wheezing ?Gastrointestinal: + nausea, + vomiting, + diarrhea, no constipation ?Genitourinary: no urinary incontinence, no dysuria, no hematuria ?Musculoskeletal: no arthralgias, no myalgias ?Skin: no skin lesions, no pruritus, ?Neuro: + weakness, no loss of consciousness, no syncope ?Psych: no anxiety, no depression, + decrease appetite ?Heme/Lymph: no bruising, no bleeding ? ?ED Course: Discussed with EDP, patient requiring hospitalization for chief concerns of recurrent C. difficile with hypokalemia. ? ?Assessment/Plan ? ?Principal Problem: ?  Near syncope ?Active Problems: ?  C. difficile colitis ?  Hypokalemia ?  ?Assessment and Plan: ? ?* Near syncope ?- EKG on admission ordered with instructions uploaded to epic ?- Presumed secondary to dehydration ?- Lactated ringer 125 mL/h IVF, for 16 hours ?- Admit to telemetry medical, observation ? ?Hypokalemia ?- Check magnesium level ?- Presumed secondary to GI loss ?- Status post potassium chloride 40 mill equivalent one-time dose per EDP ?- BMP in the a.m. ? ?C. difficile colitis ?- Continue fidaxomicin 200 mg p.o. twice daily, 10 days ordered ? ?Outpatient 07/24/2021: Patient with prescribed vancomycin 250 mg 4 times daily, 40 tablets ? ?Chart reviewed.  ? ?DVT prophylaxis: Enoxaparin ?Code Status: Full code ?Diet: Heart healthy ?Family Communication: No, she states her husband Heidi Cross if  she is here ?Disposition Plan: Pending clinical course, anticipate discharge in the a.m. ?Consults called: None at this time ?Admission status: Telemetry medical, observation ? ?Past Medical History:  ?Diagnosis Date  ? Anemia   ? " when I was young  and when I was pregnant "  ? Cancer [facility])   ? Basal Cell Carcinoma face  ? DDD (degenerative disc disease), lumbar   ? Depression   ? Environmental allergies   ? GERD (gastroesophageal reflux disease)   ? Headache   ? Hyperlipidemia   ? Insomnia   ? Mild atherosclerosis of both carotid arteries   ? Peripheral neuropathy   ? PONV (postoperative nausea and vomiting)   ? Shortness of breath dyspnea   ? with exertion  ? Spinal cord tumor   ? lumbar  ? Syncope   ? Urge incontinence   ? Wears contact lenses   ? ?Past Surgical History:  ?Procedure Laterality Date  ? ABDOMINAL HYSTERECTOMY    ? CHOLECYSTECTOMY    ? COLONOSCOPY W/ BIOPSIES AND POLYPECTOMY    ? x3  ? COLONOSCOPY WITH PROPOFOL N/A 09/02/2017  ? Procedure: COLONOSCOPY WITH PROPOFOL;  Surgeon: Manya Silvas, MD;  Location: [facility] ENDOSCOPY;  Service: Endoscopy;  Laterality: N/A;  ? DILATION AND CURETTAGE OF UTERUS    ? ESOPHAGOGASTRODUODENOSCOPY N/A 09/02/2017  ? Procedure: ESOPHAGOGASTRODUODENOSCOPY (EGD);  Surgeon: Manya Silvas, MD;  Location[facility] ENDOSCOPY;  Service: Endoscopy;  Laterality: N/A;  ? ESOPHAGOGASTRODUODENOSCOPY N/A 12/29/2020  ? Procedure: ESOPHAGOGASTRODUODENOSCOPY (EGD);  Surgeon: Annamaria Helling, DO;  Location: [facility] END[facility]COPY;  Service: Gastroenterology;  Laterality: N/A;  ? LAMINECTOMY N/A 11/03/2015  ? Procedure: L1-2 Laminectomy for resection of intradural tumor;  Surgeon: Newman Pies, MD;  Location: [place] NEURO ORS;  Service: Neurosurgery;  Laterality: N/A;  L1-2 Laminectomy for resection of intradural tumor  ? LEFT HEART CATH AND CORONARY ANGIOGRAPHY Left 06/06/2017  ? Procedure: LEFT HEART CATH AND CORONARY ANGIOGRAPHY;  Surgeon: Isaias Cowman, MD;  Location: [place] CV LAB;  Service: Cardiovascular;  Laterality: Left;  ? TONSILLECTOMY    ? TOTAL KNEE ARTHROPLASTY Right 08/13/2019  ? Procedure: RIGHT TOTAL KNEE ARTHROPLASTY;  Surgeon: Corky Mull, MD;  Location: [facility] ORS;  Service: Orthopedics;  Laterality:  Right;  ? XI ROBOTIC ASSISTED HIATAL HERNIA REPAIR N/A 04/04/2021  ? Procedure: XI ROBOTIC ASSISTED HIATAL HERNIA REPAIR WITH NISSEN FUNDOPLICATION;  Surgeon: Johnathan Hausen, MD;  Location: [facility] ORS;  Service: General;  Laterality: N/A;  ? ?Social History:  reports that she has never smoked. She has never used smokeless tobacco. She reports current alcohol use. She reports that she does not use drugs. ? ?No Known Allergies ?Family History  ?Problem Relation Age of Onset  ? Diabetes Mother   ? Cancer Mother   ? Hypertension Mother   ? Cancer Father   ? ?Family history: Family history reviewed and not pertinent. ? ?Prior to Admission medications   ?Medication Sig Start Date End Date Taking? Authorizing Provider  ?Calcium Carbonate-Vitamin D 500-125 MG-UNIT TABS Take 1 tablet by mouth every other day.    [provider]  ?cyclobenzaprine (FLEXERIL) 10 MG tablet Take 10 mg by mouth 2 (two) times daily as needed for muscle spasms. 07/07/18   [provider]  ?diltiazem (CARDIZEM CD) 120 MG 24 hr capsule Take 120 mg by mouth daily. 02/08/21   [provider]  ?fluticasone (FLONASE) 50 MCG/ACT nasal spray Place 1 spray into both nostrils daily as needed for allergies or rhinitis.  [provider]  ?furosemide (LASIX) 20 MG tablet Take 20 mg by mouth daily.    [provider]  ?gabapentin (NEURONTIN) 300 MG capsule Take 300-600 mg by mouth. 05/27/18   [provider]  ?Glucosamine-Chondroitin 500-400 MG CAPS Take 1 tablet by mouth daily.  04/15/08   [provider]  ?HYDROcodone-acetaminophen (NORCO/VICODIN) 5-325 MG tablet Take 1 tablet by mouth every 6 (six) hours as needed for moderate pain. 04/05/21   Johnathan Hausen, MD  ?montelukast (SINGULAIR) 10 MG tablet Take 10 mg by mouth at bedtime.    [provider]  ?Multiple Vitamins-Minerals (QC WOMENS DAILY MULTIVITAMIN) TABS Take 1 tablet by mouth daily.  04/15/08   [provider]  ?ondansetron  (ZOFRAN ODT) 4 MG disintegrating tablet Take 1 tablet (4 mg total) by mouth every 8 (eight) hours as needed for nausea or vomiting. 04/05/21   Johnathan Hausen, MD  ?pantoprazole (PROTONIX) 40 MG tablet Take

## 2021-08-23 NOTE — Assessment & Plan Note (Signed)
-   EKG on admission ordered with instructions uploaded to epic ?- Presumed secondary to dehydration ?- Lactated ringer 125 mL/h IVF, for 16 hours ?- Admit to telemetry medical, observation ?

## 2021-08-23 NOTE — ED Notes (Signed)
Pt taken to room to be changed into new brief. Husband at bedside. EMT student attempted x1 stick for bloodwork.  ?

## 2021-08-23 NOTE — Progress Notes (Signed)
PHARMACIST - PHYSICIAN COMMUNICATION ? ?CONCERNING:  Enoxaparin (Lovenox) for DVT Prophylaxis  ? ? ?RECOMMENDATION: ?Patient was prescribed enoxaprin '40mg'$  q24 hours for VTE prophylaxis.  ? ?Filed Weights  ? 08/23/21 1438  ?Weight: 92 kg (202 lb 13.2 oz)  ? ? ?Body mass index is 29.95 kg/m?. ? ?Estimated Creatinine Clearance: 70 mL/min (by C-G formula based on SCr of 0.74 mg/dL). ? ? ?Based on [place] patient is candidate for enoxaparin 0.'5mg'$ /kg TBW SQ every 24 hours based on BMI being ~30. ? ?DESCRIPTION: ?Pharmacy has adjusted enoxaparin dose per [facility] policy. ? ?Patient is now receiving enoxaparin 0.5 mg/kg every 24 hours  ? ?Renda Rolls, PharmD, MBA ?08/23/2021 ?10:25 PM ? ?

## 2021-08-23 NOTE — ED Triage Notes (Signed)
Pt comes ems from home with cdiff x3-4 weeks. Finished antibiotics yesterday. Has had weakness and dizziness today. Was orthostatic with ems. Pt appears pale. Pt had 334m NS with EMS. 20G L wrist. Cbg 147. NSR. Aox4.  ?

## 2021-08-23 NOTE — Hospital Course (Addendum)
[name] is a 81 year old female with hypertension, depression, GERD, hyperlipidemia, hiatal hernia status post robotic hiatal hernia repair and Nissan fundoplication in 5391, who presents emergency department for chief concerns of weakness and presyncope. ? ?Initial vitals in the emergency department showed temperature of 97.9, respiration rate 18, heart rate of 70, blood pressure 135/62, SPO2 of 95% on room air. ? ?Serum sodium was 139, potassium 3.0, chloride of 107, bicarb 22, BUN of 9, serum creatinine of 0.79, GFR greater than 60, nonfasting blood glucose 106, WBC 5.9, hemoglobin 10.3, platelets of 328.  UA was negative for leukocytes or nitrites. ? ?Patient's C. difficile test screen was positive and C. difficile PCR was positive.  High sensitive troponin was 7. ? ?ED treatment: Patient was started on Fidaxomicin 200 mg p.o. twice daily, for 10 days by EDP ?

## 2021-08-23 NOTE — Assessment & Plan Note (Signed)
-   Continue fidaxomicin 200 mg p.o. twice daily, 10 days ordered ?

## 2021-08-23 NOTE — ED Provider Notes (Signed)
? ?[facility] ?Provider Note ? ? ? None  ?  (approximate) ? ? ?History  ? ?Near syncope ? ? ?HPI ? ?Heidi Cross is a 81 y.o. female  who, per PCP note  from 6 days ago had recent c. Diff s/p vancomycin treatment, who presents to the emergency department today because of concerns for a near syncopal episode.  The patient states that she had been sitting down.  She then got up because she felt like she needed to use the restroom.  When she did that she became very lightheaded.  Felt like she might pass out.  Felt like she was breathing heavy.  She denies any chest pressure or palpitations.  She did have some sweating.  Episode lasted about 10 to 15 minutes.  She does state that she was recently treated for C. difficile having finished antibiotics about a week ago.  However since yesterday she feels like her stools have become more watery again.  She has had about 5-6 watery stools since yesterday. ? ? ?Physical Exam  ? ?Triage Vital Signs: ?ED Triage Vitals  ?Enc Vitals Group  ?   BP 08/23/21 1440 135/62  ?   Pulse Rate 08/23/21 1440 70  ?   Resp 08/23/21 1440 18  ?   Temp 08/23/21 1440 97.9 ?F (36.6 ?C)  ?   Temp Source 08/23/21 1440 Oral  ?   SpO2 08/23/21 1440 95 %  ?   Weight 08/23/21 1438 202 lb 13.2 oz (92 kg)  ?   Height --   ?   Head Circumference --   ?   Peak Flow --   ?   Pain Score 08/23/21 1438 7  ? ?Most recent vital signs: ?Vitals:  ? 08/23/21 1440  ?BP: 135/62  ?Pulse: 70  ?Resp: 18  ?Temp: 97.9 ?F (36.6 ?C)  ?SpO2: 95%  ? ?General: Awake, no distress.  ?CV:  Good peripheral perfusion.  ?Resp:  Normal effort. Lungs clear to auscultation. ?Abd:  No distention.  ? ? ?ED Results / Procedures / Treatments  ? ?Labs ?(all labs ordered are listed, but only abnormal results are displayed) ?Labs Reviewed  ?C DIFFICILE QUICK SCREEN W PCR REFLEX   - Abnormal; Notable for the following components:  ?    Result Value  ? C Diff antigen POSITIVE (*)   ? All other components within normal  limits  ?CLOSTRIDIUM DIFFICILE BY PCR, REFLEXED - Abnormal; Notable for the following components:  ? Toxigenic C. Difficile by PCR POSITIVE (*)   ? All other components within normal limits  ?CBC WITH DIFFERENTIAL/PLATELET - Abnormal; Notable for the following components:  ? RBC 3.42 (*)   ? Hemoglobin 10.3 (*)   ? HCT 32.6 (*)   ? RDW 15.8 (*)   ? All other components within normal limits  ?COMPREHENSIVE METABOLIC PANEL - Abnormal; Notable for the following components:  ? Potassium 3.0 (*)   ? Glucose, Bld 106 (*)   ? Calcium 7.9 (*)   ? Total Protein 4.9 (*)   ? Albumin 2.2 (*)   ? All other components within normal limits  ?URINALYSIS, COMPLETE (UACMP) WITH MICROSCOPIC - Abnormal; Notable for the following components:  ? Color, Urine YELLOW (*)   ? APPearance HAZY (*)   ? Protein, ur 30 (*)   ? All other components within normal limits  ?BASIC METABOLIC PANEL  ?CBC  ?MAGNESIUM  ?MAGNESIUM  ?TROPONIN I (HIGH SENSITIVITY)  ? ? ? ?EKG ? ?  Apolonio Schneiders, attending physician, personally viewed and interpreted this EKG ? ?EKG Time: 2300 ?Rate: 101 ?Rhythm: sinus tachycardia ?Axis: left axis deviation ?Intervals: qtc 482 ?QRS: narrow ?ST changes: no st elevation ?Impression: abnormal ekg ? ? ?RADIOLOGY ?None ? ? ?PROCEDURES: ? ?Critical Care performed: No ? ?Procedures ? ? ?MEDICATIONS ORDERED IN ED: ?Medications - No data to display ? ? ?IMPRESSION / MDM / ASSESSMENT AND PLAN / ED COURSE  ?I reviewed the triage vital signs and the nursing notes. ?             ?               ? ?Differential diagnosis includes, but is not limited to, C. difficile, functional diarrhea. ? ?Patient presents to the emergency department today because of concerns for near syncopal episode as well as increased diarrhea.  Patient was recently treated for C. difficile however since then has been given antibiotics for UTI.  Did have concerns for possible recurrent C. difficile and dehydration causing the near syncopal episode.  Patient was  given IV fluids.  C. difficile was positive.  Discussed with Dr. Tobie Poet with the hospitalist service will plan on admission.  Discussed findings and plan with patient. ? ? ?FINAL CLINICAL IMPRESSION(S) / ED DIAGNOSES  ? ?Final diagnoses:  ?C. difficile diarrhea  ? ? ? ? ? ? ?Note:  This document was prepared using Dragon voice recognition software and may include unintentional dictation errors. ? ?  ?Nance Pear, MD ?08/23/21 2343 ? ?

## 2021-08-23 NOTE — Assessment & Plan Note (Signed)
-   Check magnesium level ?- Presumed secondary to GI loss ?- Status post potassium chloride 40 mill equivalent one-time dose per EDP ?- BMP in the a.m. ?

## 2021-08-24 ENCOUNTER — Other Ambulatory Visit (HOSPITAL_COMMUNITY): Payer: Self-pay

## 2021-08-24 ENCOUNTER — Other Ambulatory Visit: Payer: Self-pay

## 2021-08-24 DIAGNOSIS — A0472 Enterocolitis due to Clostridium difficile, not specified as recurrent: Secondary | ICD-10-CM

## 2021-08-24 DIAGNOSIS — E876 Hypokalemia: Secondary | ICD-10-CM

## 2021-08-24 DIAGNOSIS — R55 Syncope and collapse: Secondary | ICD-10-CM | POA: Diagnosis not present

## 2021-08-24 LAB — CBC
HCT: 29.7 % — ABNORMAL LOW (ref 36.0–46.0)
Hemoglobin: 9.6 g/dL — ABNORMAL LOW (ref 12.0–15.0)
MCH: 29.7 pg (ref 26.0–34.0)
MCHC: 32.3 g/dL (ref 30.0–36.0)
MCV: 92 fL (ref 80.0–100.0)
Platelets: 330 10*3/uL (ref 150–400)
RBC: 3.23 MIL/uL — ABNORMAL LOW (ref 3.87–5.11)
RDW: 15.3 % (ref 11.5–15.5)
WBC: 7.4 10*3/uL (ref 4.0–10.5)
nRBC: 0 % (ref 0.0–0.2)

## 2021-08-24 LAB — MAGNESIUM
Magnesium: 1.8 mg/dL (ref 1.7–2.4)
Magnesium: 1.9 mg/dL (ref 1.7–2.4)

## 2021-08-24 LAB — BASIC METABOLIC PANEL
Anion gap: 7 (ref 5–15)
BUN: 7 mg/dL — ABNORMAL LOW (ref 8–23)
CO2: 24 mmol/L (ref 22–32)
Calcium: 7.6 mg/dL — ABNORMAL LOW (ref 8.9–10.3)
Chloride: 106 mmol/L (ref 98–111)
Creatinine, Ser: 0.52 mg/dL (ref 0.44–1.00)
GFR, Estimated: >60 mL/min (ref >60)
Glucose, Bld: 91 mg/dL (ref 70–99)
Potassium: 3.3 mmol/L — ABNORMAL LOW (ref 3.5–5.1)
Sodium: 137 mmol/L (ref 135–145)

## 2021-08-24 MED ORDER — HYDRALAZINE HCL 20 MG/ML IJ SOLN: 20.0000 mg | Freq: Four times a day (QID) | INTRAMUSCULAR | Status: DC | PRN

## 2021-08-24 MED ORDER — ENOXAPARIN SODIUM 40 MG/0.4ML IJ SOSY
40.0000 mg | PREFILLED_SYRINGE | INTRAMUSCULAR | Status: DC
Start: 2021-08-24 — End: 2021-08-25
  Administered 2021-08-24 – 2021-08-25 (×2): 40 mg via SUBCUTANEOUS
  Filled 2021-08-24 (×2): qty 0.4

## 2021-08-24 MED ORDER — DILTIAZEM HCL ER COATED BEADS 120 MG PO CP24
120.0000 mg | ORAL_CAPSULE | Freq: Every day | ORAL | Status: DC
  Administered 2021-08-24 – 2021-08-25 (×2): 120 mg via ORAL
  Filled 2021-08-24 (×2): qty 1

## 2021-08-24 MED ORDER — SODIUM CHLORIDE 0.9 % IV SOLN: INTRAVENOUS | Status: DC

## 2021-08-24 MED ORDER — POTASSIUM CHLORIDE CRYS ER 20 MEQ PO TBCR
40.0000 meq | EXTENDED_RELEASE_TABLET | Freq: Once | ORAL | Status: AC
  Administered 2021-08-24: 40 meq via ORAL
  Filled 2021-08-24: qty 2

## 2021-08-24 NOTE — Progress Notes (Addendum)
?PROGRESS NOTE ? ? ? ?Heidi Cross  QZR:[id number] DOB: 09-17-1940 DOA: 08/23/2021 ?PCP: Rusty Aus, MD ? ?Assessment & Plan: ?  ?Principal Problem: ?  Near syncope ?Active Problems: ?  C. difficile colitis ?  Hypokalemia ? ? ?Near syncope: etiology unclear, possibly secondary to dehydration. Continue on IVFs.  ?  ?Hypokalemia: potassium given  ?  ?C. difficile colitis: continue on fidaxomicin x 10 days  ? ?Normocytic anemia: H&H are labile. No need for a transfusion currently  ? ? ? ?DVT prophylaxis: lovenox  ?Code Status:  full  ?Family Communication: discussed pt's care w/ pt's daughter, Butch Penny, and answered her questions  ?Disposition Plan: depends on PT/OT recs  ? ?Level of care: Telemetry Medical ? ?Status is: Observation ?The patient remains OBS appropriate and will d/c before 2 midnights. ? ? ? ?Consultants:  ? ? ?Procedures: ? ?Antimicrobials: fidaxomicin  ? ? ?Subjective: ?Pt c/o malaise  ? ?Objective: ?Vitals:  ? 08/23/21 2100 08/23/21 2130 08/23/21 2339 08/24/21 0533  ?BP: (!) 147/93 (!) 154/80 (!) 170/69 (!) 149/72  ?Pulse: 79 83 82 95  ?Resp: '18  15 18  '$ ?Temp:   98.2 ?F (36.8 ?C) 99.1 ?F (37.3 ?C)  ?TempSrc:      ?SpO2: 100% 100% 100% 93%  ?Weight:      ? ?No intake or output data in the 24 hours ending 08/24/21 0736 ?Filed Weights  ? 08/23/21 1438  ?Weight: 92 kg  ? ? ?Examination: ? ?General exam: Appears calm and comfortable  ?Respiratory system: Clear to auscultation. Respiratory effort normal. ?Cardiovascular system: S1 & S2+. No  rubs, gallops or clicks.  ?Gastrointestinal system: Abdomen is nondistended, soft and nontender. Hyperactive bowel sounds heard. ?Central nervous system: Alert and oriented. Moves all extremities  ?Psychiatry: Judgement and insight appear normal. Flat mood and affect.  ? ? ? ?Data Reviewed: I have personally reviewed following labs and imaging studies ? ?CBC: ?Recent Labs  ?Lab 08/23/21 ?1500 08/24/21 ?0400  ?WBC 5.9 7.4  ?NEUTROABS 3.2  --   ?HGB 10.3* 9.6*  ?HCT  32.6* 29.7*  ?MCV 95.3 92.0  ?PLT 328 330  ? ?Basic Metabolic Panel: ?Recent Labs  ?Lab 08/23/21 ?1500 08/24/21 ?0023 08/24/21 ?0400  ?NA 139  --  137  ?K 3.0*  --  3.3*  ?CL 107  --  106  ?CO2 22  --  24  ?GLUCOSE 106*  --  91  ?BUN 9  --  7*  ?CREATININE 0.74  --  0.52  ?CALCIUM 7.9*  --  7.6*  ?MG  --  1.9 1.8  ? ?GFR: ?Estimated Creatinine Clearance: 70 mL/min (by C-G formula based on SCr of 0.52 mg/dL). ?Liver Function Tests: ?Recent Labs  ?Lab 08/23/21 ?1500  ?AST 26  ?ALT 15  ?ALKPHOS 91  ?BILITOT 0.5  ?PROT 4.9*  ?ALBUMIN 2.2*  ? ?No results for input(s): LIPASE, AMYLASE in the last 168 hours. ?No results for input(s): AMMONIA in the last 168 hours. ?Coagulation Profile: ?No results for input(s): INR, PROTIME in the last 168 hours. ?Cardiac Enzymes: ?No results for input(s): CKTOTAL, CKMB, CKMBINDEX, TROPONINI in the last 168 hours. ?BNP (last 3 results) ?No results for input(s): PROBNP in the last 8760 hours. ?HbA1C: ?No results for input(s): HGBA1C in the last 72 hours. ?CBG: ?No results for input(s): GLUCAP in the last 168 hours. ?Lipid Profile: ?No results for input(s): CHOL, HDL, LDLCALC, TRIG, CHOLHDL, LDLDIRECT in the last 72 hours. ?Thyroid Function Tests: ?No results for input(s): TSH, T4TOTAL, FREET4,  T3FREE, THYROIDAB in the last 72 hours. ?Anemia Panel: ?No results for input(s): VITAMINB12, FOLATE, FERRITIN, TIBC, IRON, RETICCTPCT in the last 72 hours. ?Sepsis Labs: ?No results for input(s): PROCALCITON, LATICACIDVEN in the last 168 hours. ? ?Recent Results (from the past 240 hour(s))  ?C Difficile Quick Screen w PCR reflex     Status: Abnormal  ? Collection Time: 08/23/21  3:35 PM  ? Specimen: STOOL  ?Result Value Ref Range Status  ? C Diff antigen POSITIVE (A) NEGATIVE Final  ? C Diff toxin NEGATIVE NEGATIVE Final  ? C Diff interpretation Results are indeterminate. See PCR results.  Final  ?  Comment: Performed at [facility], [address] [place], [place] [address]  ?C. Diff by  PCR, Reflexed     Status: Abnormal  ? Collection Time: 08/23/21  3:35 PM  ?Result Value Ref Range Status  ? Toxigenic C. Difficile by PCR POSITIVE (A) NEGATIVE Final  ?  Comment: Positive for toxigenic C. difficile with little to no toxin production. Only treat if clinical presentation suggests symptomatic illness. ?Performed at [facility], [address] [place], [place] [address] ?  ?  ? ? ? ? ? ?Radiology Studies: ?No results found. ? ? ? ? ? ?Scheduled Meds: ? enoxaparin (LOVENOX) injection  0.5 mg/kg Subcutaneous Q24H  ? fidaxomicin  200 mg Oral BID  ? traZODone  50 mg Oral QHS  ? ?Continuous Infusions: ? lactated ringers 125 mL/hr at 08/24/21 0000  ? ? ? LOS: 0 days  ? ? ?Time spent: 33 mins ? ? ? ?Wyvonnia Dusky, MD ?[facility] ?Pager [phone] ? ?If 7PM-7AM, please contact night-coverage ?08/24/2021, 7:36 AM  ? ?

## 2021-08-24 NOTE — Progress Notes (Signed)
Pharmacy - Antimicrobial Stewardship ? ?Refer to [name] benefit notes for further details ? ?Patient approved via [employer] Patient Assistance for fidaxomicin '200mg'$  #20.  It will be mailed to patient home.  She is aware that medication was approved at no cost to her and will mailed to her home.  I explained how she will take twice daily until gone.  Side effects usually minimal as not absorbed. She did not have further questions.  ? ?[name], PharmD, BCPS, BCIDP ?Work Cell: [phone] ?08/24/2021 11:07 AM ? ? ?

## 2021-08-24 NOTE — Evaluation (Signed)
Physical Therapy Evaluation ?Patient Details ?Name: Heidi Cross ?MRN: [id number] ?DOB: [birth year] ?Today's Date: 08/24/2021 ? ?History of Present Illness ? 81 year old female with hypertension, depression, GERD, hyperlipidemia, hiatal hernia status post robotic hiatal hernia repair and [employer] fundoplication in 0383, who presents emergency department for chief concerns of weakness and presyncope.  ?Clinical Impression ? Pt is a pleasant 81 year old female who was admitted for near syncope. Pt performs transfers and ambulation with cga and use of RW. Fatigues quickly and generally feels worn out. Pt demonstrates deficits with strength/endurance/mobility. Educated to continue use of RW for fall prevention and balance. Would benefit from skilled PT to address above deficits and promote optimal return to PLOF. Recommend transition to [place] upon discharge from acute hospitalization. ? ?Orthostatics: ?Received in sitting-Seated: 128/75; HR 100 ?Standing: 103/63; HR 102; mild dizziness ? ?   ? ?Recommendations for follow up therapy are one component of a multi-disciplinary discharge planning process, led by the attending physician.  Recommendations may be updated based on patient status, additional functional criteria and insurance authorization. ? ?Follow Up Recommendations Home health PT ? ?  ?Assistance Recommended at Discharge Intermittent Supervision/Assistance  ?Patient can return home with the following ? A little help with walking and/or transfers;A little help with bathing/dressing/bathroom;Assistance with cooking/housework;Help with stairs or ramp for entrance ? ?  ?Equipment Recommendations None recommended by PT  ?Recommendations for Other Services ?    ?  ?Functional Status Assessment Patient has had a recent decline in their functional status and demonstrates the ability to make significant improvements in function in a reasonable and predictable amount of time.  ? ?  ?Precautions / Restrictions  Precautions ?Precautions: Fall ?Restrictions ?Weight Bearing Restrictions: No  ? ?  ? ?Mobility ? Bed Mobility ?  ?  ?  ?  ?  ?  ?  ?General bed mobility comments: received seated at EOB ?  ? ?Transfers ?Overall transfer level: Needs assistance ?Equipment used: Rolling walker (2 wheels) ?Transfers: Sit to/from Stand ?Sit to Stand: Min guard ?  ?  ?  ?  ?  ?General transfer comment: cues for sequencing. RW used with upright posture. Orthostatics obtained. Slightly unsteady during standing ?  ? ?Ambulation/Gait ?Ambulation/Gait assistance: Min guard ?Gait Distance (Feet): 20 Feet ?Assistive device: Rolling walker (2 wheels) ?Gait Pattern/deviations: Step-to pattern ?  ?  ?  ?General Gait Details: ambulated around room, limited by fatigue. Pt declined further mobility efforts. Slow and effortful gait pattern. Safe technique with RW ? ?Stairs ?  ?  ?  ?  ?  ? ?Wheelchair Mobility ?  ? ?Modified Rankin (Stroke Patients Only) ?  ? ?  ? ?Balance Overall balance assessment: Needs assistance ?Sitting-balance support: Feet supported ?Sitting balance-Leahy Scale: Good ?  ?  ?Standing balance support: Bilateral upper extremity supported ?Standing balance-Leahy Scale: Fair ?  ?  ?  ?  ?  ?  ?  ?  ?  ?  ?  ?  ?   ? ? ? ?Pertinent Vitals/Pain Pain Assessment ?Pain Assessment: No/denies pain  ? ? ?Home Living Family/patient expects to be discharged to:: Private residence ?Living Arrangements: Spouse/significant other ?Available Help at Discharge: Family;Available 24 hours/day ?Type of Home: House ?Home Access: Stairs to enter ?Entrance Stairs-Rails: Right;Left;Can reach both ?Entrance Stairs-Number of Steps: 2 ?  ?Home Layout: One level ?Home Equipment: Kasandra Knudsen - single point;Shower seat - built in;Grab bars - toilet;Grab bars - tub/shower;Hand held shower head ?Additional Comments: has stand up Harmon Pier walker  ?  ?  Prior Function Prior Level of Function : Independent/Modified Independent ?  ?  ?  ?  ?  ?  ?Mobility Comments: Pt reports  use of RW or SPC depending on how she feels. Husband is available to assist as needed. ?ADLs Comments: Pt reports performing self care tasks and cooking independently ?  ? ? ?Hand Dominance  ? Dominant Hand: Right ? ?  ?Extremity/Trunk Assessment  ? Upper Extremity Assessment ?Upper Extremity Assessment: Generalized weakness (B UE grossly 4/5) ?  ? ?Lower Extremity Assessment ?Lower Extremity Assessment: Generalized weakness (B LE grossly 4-/5) ?  ? ?   ?Communication  ? Communication: No difficulties  ?Cognition Arousal/Alertness: Awake/alert ?Behavior During Therapy: [facility] for tasks assessed/performed ?Overall Cognitive Status: Within Functional Limits for tasks assessed ?  ?  ?  ?  ?  ?  ?  ?  ?  ?  ?  ?  ?  ?  ?  ?  ?General Comments: pleasant and agreeable to session ?  ?  ? ?  ?General Comments   ? ?  ?Exercises Other Exercises ?Other Exercises: seated ther-ex performed on B LE including AP, hip abd/add, and quad sets. 10 reps with supervision  ? ?Assessment/Plan  ?  ?PT Assessment Patient needs continued PT services  ?PT Problem List Decreased strength;Decreased activity tolerance;Decreased balance;Decreased mobility ? ?   ?  ?PT Treatment Interventions DME instruction;Gait training;Balance training;Therapeutic exercise   ? ?PT Goals (Current goals can be found in the Care Plan section)  ?Acute Rehab PT Goals ?Patient Stated Goal: to go home ?PT Goal Formulation: With patient ?Time For Goal Achievement: 09/07/21 ?Potential to Achieve Goals: Good ? ?  ?Frequency Min 2X/week ?  ? ? ?Co-evaluation   ?  ?  ?  ?  ? ? ?  ?AM-PAC PT "6 Clicks" Mobility  ?Outcome Measure Help needed turning from your back to your side while in a flat bed without using bedrails?: None ?Help needed moving from lying on your back to sitting on the side of a flat bed without using bedrails?: A Little ?Help needed moving to and from a bed to a chair (including a wheelchair)?: A Little ?Help needed standing up from a chair using your arms  (e.g., wheelchair or bedside chair)?: A Little ?Help needed to walk in hospital room?: A Little ?Help needed climbing 3-5 steps with a railing? : A Little ?6 Click Score: 19 ? ?  ?End of Session Equipment Utilized During Treatment: Gait belt ?Activity Tolerance: Patient tolerated treatment well ?Patient left: in chair;with chair alarm set;with family/visitor present ?Nurse Communication: Mobility status ?PT Visit Diagnosis: Unsteadiness on feet (R26.81);Muscle weakness (generalized) (M62.81);History of falling (Z91.81);Difficulty in walking, not elsewhere classified (R26.2) ?  ? ?Time: 2595-6387 ?PT Time Calculation (min) (ACUTE ONLY): 21 min ? ? ?Charges:   PT Evaluation ?$PT Eval Low Complexity: 1 Low ?PT Treatments ?$Therapeutic Exercise: 8-22 mins ?  ?   ? ? ?Greggory Stallion, PT, DPT, GCS ?[phone] ? ? ?Jazier Mcglamery ?08/24/2021, 3:39 PM ? ?

## 2021-08-24 NOTE — TOC Benefit Eligibility Note (Signed)
Patient Advocate Encounter  Patient is approved through the [employer] Patient Assistance Program for Dificid through 05/20/2022  Medication will be mailed to patient's home.      Dale Strausser, CPhT Pharmacy Patient Advocate Specialist [facility] Patient Advocate Team Direct Number: [phone]  Fax: [phone]  

## 2021-08-24 NOTE — Progress Notes (Signed)
Pharmacy - Antimicrobial Stewardship ? ?Refer to [name] benefit notes for further details ? ?Patient approved via [employer] Patient Assistance for fidaxomicin '200mg'$  #20.  It will be mailed to patient home.  She is aware that medication was approved at no cost to her and mailed to her home.  I explained how she will take twice daily until gone.  Side effects usually minimal as not absorbed. She did not have further questions.  ? ?[name], PharmD, BCPS, BCIDP ?Work Cell: [phone] ?08/24/2021 11:06 AM ? ? ?

## 2021-08-24 NOTE — TOC Benefit Eligibility Note (Addendum)
Patient Advocate Encounter ? ?Insurance verification completed.   ? ?The patient is currently admitted and upon discharge could be taking Dificid 200 mg tablets. ? ?The current 10 day co-pay is, $1,471.36.  ? ?The patient is insured through [employer] Part D  ? ? ? ?Lyndel Safe, CPhT ?Pharmacy Patient Advocate Specialist ?[place] Patient Advocate Team ?Direct Number: [phone]  Fax: [phone] ? ? ? ? ? ?  ?

## 2021-08-24 NOTE — Evaluation (Signed)
Occupational Therapy Evaluation ?Patient Details ?Name: Heidi Cross ?MRN: [id number] ?DOB: Dec 07, 1940 ?Today's Date: 08/24/2021 ? ? ?History of Present Illness 81 year old female with hypertension, depression, GERD, hyperlipidemia, hiatal hernia status post robotic hiatal hernia repair and [employer] fundoplication in 3382, who presents emergency department for chief concerns of weakness and presyncope.  ? ?Clinical Impression ?  ?Patient presenting with decreased Ind in self care, balance, functional mobility/transfers, endurance, and safety awareness. Patient reports being Mod I at home with use of RW or SPC. She uses SPC in community with support from husband and endorses furniture cruising as well. Pt is independent in self care tasks at baseline. She lives with husband who is available to assist as needed at discharge as well as a daughter that lives a few miles down the road. Patient currently functioning at min guard - min A for LB dressing, toileting, and functional mobility. She is very motivated to return home. Patient will benefit from acute OT to increase overall independence in the areas of ADLs, functional mobility, and safety awareness in order to safely discharge home with family. ?   ? ?Recommendations for follow up therapy are one component of a multi-disciplinary discharge planning process, led by the attending physician.  Recommendations may be updated based on patient status, additional functional criteria and insurance authorization.  ? ?Follow Up Recommendations ? Home health OT  ?  ?Assistance Recommended at Discharge Intermittent Supervision/Assistance  ?Patient can return home with the following A little help with walking and/or transfers;A little help with bathing/dressing/bathroom;Help with stairs or ramp for entrance;Assist for transportation;Assistance with cooking/housework ? ?  ?Functional Status Assessment ? Patient has had a recent decline in their functional status and demonstrates the  ability to make significant improvements in function in a reasonable and predictable amount of time.  ?Equipment Recommendations ? None recommended by OT  ?  ?   ?Precautions / Restrictions Precautions ?Precautions: Fall  ? ?  ? ?Mobility Bed Mobility ?Overal bed mobility: Needs Assistance ?Bed Mobility: Supine to Sit, Sit to Supine ?  ?  ?Supine to sit: Supervision ?Sit to supine: Supervision ?  ?General bed mobility comments: no physical assistance but needing increased time and cuing for technique ?  ? ?Transfers ?Overall transfer level: Needs assistance ?Equipment used: 1 person hand held assist, Rolling walker (2 wheels) ?Transfers: Sit to/from Stand, Bed to chair/wheelchair/BSC ?Sit to Stand: Min assist ?  ?  ?Step pivot transfers: Min guard ?  ?  ?  ?  ? ?  ?Balance Overall balance assessment: Needs assistance ?Sitting-balance support: Feet supported ?Sitting balance-Leahy Scale: Good ?  ?  ?Standing balance support: Reliant on assistive device for balance, During functional activity ?Standing balance-Leahy Scale: Fair ?  ?  ?  ?  ?  ?  ?  ?  ?  ?  ?  ?  ?   ? ?ADL either performed or assessed with clinical judgement  ? ?ADL Overall ADL's : Needs assistance/impaired ?  ?  ?Grooming: Wash/dry hands;Standing;Supervision/safety ?  ?  ?  ?  ?  ?  ?  ?Lower Body Dressing: Minimal assistance;Sit to/from stand ?  ?Toilet Transfer: Minimal Teacher, English as a foreign language;Ambulation;Rolling walker (2 wheels) ?  ?Toileting- Water quality scientist and Hygiene: Min guard;Sit to/from stand ?  ?  ?  ?Functional mobility during ADLs: Min guard;Rolling walker (2 wheels) ?   ? ? ? ?Vision Patient Visual Report: No change from baseline ?   ?   ?   ?   ? ?  Pertinent Vitals/Pain Pain Assessment ?Pain Assessment: No/denies pain  ? ? ? ?Hand Dominance Right ?  ?Extremity/Trunk Assessment Upper Extremity Assessment ?Upper Extremity Assessment: Generalized weakness ?  ?Lower Extremity Assessment ?Lower Extremity Assessment: Generalized  weakness ?  ?  ?  ?Communication Communication ?Communication: No difficulties ?  ?Cognition Arousal/Alertness: Awake/alert ?Behavior During Therapy: [facility] for tasks assessed/performed ?Overall Cognitive Status: Within Functional Limits for tasks assessed ?  ?  ?  ?  ?  ?  ?  ?  ?  ?  ?  ?  ?  ?  ?  ?  ?  ?  ?  ?   ?   ?   ? ? ?Home Living Family/patient expects to be discharged to:: Private residence ?Living Arrangements: Spouse/significant other ?Available Help at Discharge: Family;Available 24 hours/day ?Type of Home: House ?Home Access: Stairs to enter ?Entrance Stairs-Number of Steps: 2 ?Entrance Stairs-Rails: Right;Left;Can reach both ?Home Layout: One level ?  ?  ?Bathroom Shower/Tub: Walk-in shower ?  ?Bathroom Toilet: Handicapped height ?Bathroom Accessibility: Yes ?  ?Home Equipment: Conservation officer, nature (2 wheels);Cane - single point;Shower seat - built in;Grab bars - toilet;Grab bars - tub/shower;Hand held shower head ?  ?  ?  ? ?  ?Prior Functioning/Environment Prior Level of Function : Independent/Modified Independent ?  ?  ?  ?  ?  ?  ?Mobility Comments: Pt reports use of RW or SPC depending on how she feels. Husband is available to assist as needed. ?ADLs Comments: Pt reports performing self care tasks and cooking independently ?  ? ?  ?  ?OT Problem List: Decreased strength;Decreased activity tolerance;Impaired balance (sitting and/or standing);Decreased knowledge of use of DME or AE;Decreased safety awareness ?  ?   ?OT Treatment/Interventions: Self-care/ADL training;Therapeutic exercise;Therapeutic activities;Energy conservation;Cognitive remediation/compensation;DME and/or AE instruction;Patient/family education;Manual therapy;Balance training  ?  ?OT Goals(Current goals can be found in the care plan section) Acute Rehab OT Goals ?Patient Stated Goal: to go home ?OT Goal Formulation: With patient/family ?Time For Goal Achievement: 09/07/21 ?Potential to Achieve Goals: Good ?ADL Goals ?Pt Will Perform  Grooming: with modified independence;standing ?Pt Will Perform Lower Body Dressing: with modified independence;sit to/from stand ?Pt Will Transfer to Toilet: with modified independence;ambulating ?Pt Will Perform Toileting - Clothing Manipulation and hygiene: with modified independence;sit to/from stand  ?OT Frequency: Min 2X/week ?  ? ?   ?AM-PAC OT "6 Clicks" Daily Activity     ?Outcome Measure Help from another person eating meals?: None ?Help from another person taking care of personal grooming?: A Little ?Help from another person toileting, which includes using toliet, bedpan, or urinal?: A Little ?Help from another person bathing (including washing, rinsing, drying)?: A Little ?Help from another person to put on and taking off regular upper body clothing?: None ?Help from another person to put on and taking off regular lower body clothing?: A Little ?6 Click Score: 20 ?  ?End of Session Equipment Utilized During Treatment: Rolling walker (2 wheels) ?Nurse Communication: Mobility status ? ?Activity Tolerance: Patient tolerated treatment well ?Patient left: in bed;with call bell/phone within reach;with bed alarm set;with family/visitor present ? ?OT Visit Diagnosis: Unsteadiness on feet (R26.81);Muscle weakness (generalized) (M62.81)  ?              ?Time: 1345-1406 ?OT Time Calculation (min): 21 min ?Charges:  OT General Charges ?$OT Visit: 1 Visit ?OT Evaluation ?$OT Eval Moderate Complexity: 1 Mod ?OT Treatments ?$Self Care/Home Management : 8-22 mins ?Darleen Crocker, MS, OTR/L , CBIS ?ascom [phone]  ?  08/24/21, 2:16 PM  ?

## 2021-08-25 DIAGNOSIS — I10 Essential (primary) hypertension: Secondary | ICD-10-CM

## 2021-08-25 DIAGNOSIS — A0472 Enterocolitis due to Clostridium difficile, not specified as recurrent: Secondary | ICD-10-CM | POA: Diagnosis not present

## 2021-08-25 DIAGNOSIS — R55 Syncope and collapse: Secondary | ICD-10-CM | POA: Diagnosis not present

## 2021-08-25 MED ORDER — POTASSIUM CHLORIDE CRYS ER 20 MEQ PO TBCR: 40.0000 meq | EXTENDED_RELEASE_TABLET | Freq: Once | ORAL | Status: DC

## 2021-08-25 MED ORDER — POTASSIUM CHLORIDE CRYS ER 20 MEQ PO TBCR
40.0000 meq | EXTENDED_RELEASE_TABLET | Freq: Two times a day (BID) | ORAL | Status: DC
  Administered 2021-08-25: 40 meq via ORAL
  Filled 2021-08-25: qty 2

## 2021-08-25 MED ORDER — FIDAXOMICIN 200 MG PO TABS: 200.0000 mg | ORAL_TABLET | Freq: Two times a day (BID) | ORAL | Status: DC

## 2021-08-25 NOTE — Discharge Summary (Signed)
Physician Discharge Summary  ?LELAND STASZEWSKI HUD:[id number] DOB: 04/21/1941 DOA: 08/23/2021 ? ?PCP: Rusty Aus, MD ? ?Admit date: 08/23/2021 ?Discharge date: 08/25/2021 ? ?Admitted From: home  ?Disposition:  home w/ home  ? ?Recommendations for Outpatient Follow-up:  ?Follow up with PCP in 1-2 weeks ? ? ?Home Health: yes ?Equipment/Devices: ? ?Discharge Condition: stable  ?CODE STATUS: full  ?Diet recommendation: Heart Healthy  ? ?Brief/Interim Summary: ?HPI was taken from Dr. Tobie Poet: ?[name] is a 81 year old female with hypertension, depression, GERD, hyperlipidemia, hiatal hernia status post robotic hiatal hernia repair and [employer] fundoplication in 8588, who presents emergency department for chief concerns of weakness and presyncope. ?  ?Initial vitals in the emergency department showed temperature of 97.9, respiration rate 18, heart rate of 70, blood pressure 135/62, SPO2 of 95% on room air. ? ?Serum sodium was 139, potassium 3.0, chloride of 107, bicarb 22, BUN of 9, serum creatinine of 0.79, GFR greater than 60, nonfasting blood glucose 106, WBC 5.9, hemoglobin 10.3, platelets of 328.  UA was negative for leukocytes or nitrites. ?  ?Patient's C. difficile test screen was positive and C. difficile PCR was positive.  High sensitive troponin was 7. ?  ?ED treatment: Patient was started on Fidaxomicin 200 mg p.o. twice daily, for 10 days by EDP ?  ?At bedside, she is able to tell me her name, age, current year.  ?  ?She reports that earlier she felt weak and was not able to stand back up. She was sitting on side of her bed and denies actually hitting her head. She was conscious and called her husband for help when things went black.  ?  ?She vomited about two times and it was red and she had pedialyte earlier. She denies blood or black vomitus.  ?  ?She denies fever. She reports the diarrhea started again yesterday.  ?  ?She reports she has lost about 30 pounds since surgery in November 2022.  ? ?Discharge  Diagnoses:  ?Principal Problem: ?  Near syncope ?Active Problems: ?  C. difficile colitis ?  Hypokalemia ? ?Near syncope: etiology unclear, possibly secondary to dehydration. D/c IVFs. Resolved  ?  ?Hypokalemia: KCl repleated  ?  ?C. difficile colitis: continue on fidaxomicin x 10 days. Improved. No diarrhea today ? ?Normocytic anemia: H&H are labile. No need for a transfusion currently ? ?HTN: continue on home dose of lasix, cardizem  ? ?Discharge Instructions ? ?Discharge Instructions   ? ? Diet - low sodium heart healthy   Complete by: As directed ?  ? Discharge instructions   Complete by: As directed ?  ? F/u w/ PCP in 1-2 weeks. Patient approved via [employer] Patient Assistance for fidaxomicin '200mg'$  #20.  It will be mailed to patient home.  She is aware that medication was approved at no cost to her and will mailed to her home  ? Increase activity slowly   Complete by: As directed ?  ? ?  ? ?Allergies as of 08/25/2021   ?No Known Allergies ?  ? ?  ?Medication List  ?  ? ?TAKE these medications   ? ?Calcium Carbonate-Vitamin D 500-125 MG-UNIT Tabs ?Take 1 tablet by mouth every other day. ?  ?cyclobenzaprine 10 MG tablet ?Commonly known as: FLEXERIL ?Take 10 mg by mouth 2 (two) times daily as needed for muscle spasms. ?  ?diltiazem 120 MG 24 hr capsule ?Commonly known as: CARDIZEM CD ?Take 120 mg by mouth daily. ?  ?fidaxomicin 200 MG  Tabs tablet ?Commonly known as: DIFICID ?Take 1 tablet (200 mg total) by mouth 2 (two) times daily. ?  ?fluticasone 50 MCG/ACT nasal spray ?Commonly known as: FLONASE ?Place 1 spray into both nostrils daily as needed for allergies or rhinitis. ?  ?furosemide 20 MG tablet ?Commonly known as: LASIX ?Take 20 mg by mouth daily. ?  ?gabapentin 300 MG capsule ?Commonly known as: NEURONTIN ?Take 300-600 mg by mouth. ?  ?Glucosamine-Chondroitin 500-400 MG Caps ?Take 1 tablet by mouth daily. ?  ?HYDROcodone-acetaminophen 5-325 MG tablet ?Commonly known as: NORCO/VICODIN ?Take 1 tablet by mouth  every 6 (six) hours as needed for moderate pain. ?  ?montelukast 10 MG tablet ?Commonly known as: SINGULAIR ?Take 10 mg by mouth at bedtime. ?  ?ondansetron 4 MG disintegrating tablet ?Commonly known as: Zofran ODT ?Take 1 tablet (4 mg total) by mouth every 8 (eight) hours as needed for nausea or vomiting. ?  ?pantoprazole 40 MG tablet ?Commonly known as: PROTONIX ?Take 1 tablet (40 mg total) by mouth daily. ?  ?PRESCRIPTION MEDICATION ?Place 1 application vaginally 2 (two) times a week. Compounded Med - Estriol 1 mg ?  ?QC Womens Daily Multivitamin Tabs ?Take 1 tablet by mouth daily. ?  ?traZODone 50 MG tablet ?Commonly known as: DESYREL ?Take 50 mg by mouth at bedtime. ?  ?venlafaxine XR 37.5 MG 24 hr capsule ?Commonly known as: EFFEXOR-XR ?Take 37.5 mg by mouth daily with breakfast. ?  ? ?  ? ? ?No Known Allergies ? ?Consultations: ? ? ? ?Procedures/Studies: ?No results found. ?(Echo, Carotid, EGD, Colonoscopy, ERCP)  ? ? ?Subjective: Pt denies any diarrhea  ? ? ?Discharge Exam: ?Vitals:  ? 08/25/21 0519 08/25/21 0855  ?BP: (!) 151/82 (!) 146/77  ?Pulse: 95 98  ?Resp: 16 18  ?Temp: 97.8 ?F (36.6 ?C) 98.1 ?F (36.7 ?C)  ?SpO2: 97% 100%  ? ?Vitals:  ? 08/24/21 1608 08/24/21 2003 08/25/21 0519 08/25/21 0855  ?BP: 127/69 137/67 (!) 151/82 (!) 146/77  ?Pulse: 83 85 95 98  ?Resp: '18 16 16 18  '$ ?Temp: 98.4 ?F (36.9 ?C) 98.8 ?F (37.1 ?C) 97.8 ?F (36.6 ?C) 98.1 ?F (36.7 ?C)  ?TempSrc:      ?SpO2: 97% 98% 97% 100%  ?Weight:      ? ? ?General: Pt is alert, awake, not in acute distress ?Cardiovascular: S1/S2 +, no rubs, no gallops ?Respiratory: CTA bilaterally, no wheezing, no rhonchi ?Abdominal: Soft, NT, ND, bowel sounds + ?Extremities: no edema, no cyanosis ? ? ? ?The results of significant diagnostics from this hospitalization (including imaging, microbiology, ancillary and laboratory) are listed below for reference.   ? ? ?Microbiology: ?Recent Results (from the past 240 hour(s))  ?C Difficile Quick Screen w PCR reflex      Status: Abnormal  ? Collection Time: 08/23/21  3:35 PM  ? Specimen: STOOL  ?Result Value Ref Range Status  ? C Diff antigen POSITIVE (A) NEGATIVE Final  ? C Diff toxin NEGATIVE NEGATIVE Final  ? C Diff interpretation Results are indeterminate. See PCR results.  Final  ?  Comment: Performed at [facility], [address] [place],  [address]  ?C. Diff by PCR, Reflexed     Status: Abnormal  ? Collection Time: 08/23/21  3:35 PM  ?Result Value Ref Range Status  ? Toxigenic C. Difficile by PCR POSITIVE (A) NEGATIVE Final  ?  Comment: Positive for toxigenic C. difficile with little to no toxin production. Only treat if clinical presentation suggests symptomatic illness. ?Performed at  [facility], [address], [place], [place] [address] ?  ?  ? ?Labs: ?BNP (last 3 results) ?Recent Labs  ?  08/31/20 ?1125  ?BNP 61.7  ? ?Basic Metabolic Panel: ?Recent Labs  ?Lab 08/23/21 ?1500 08/24/21 ?0023 08/24/21 ?0400  ?NA 139  --  137  ?K 3.0*  --  3.3*  ?CL 107  --  106  ?CO2 22  --  24  ?GLUCOSE 106*  --  91  ?BUN 9  --  7*  ?CREATININE 0.74  --  0.52  ?CALCIUM 7.9*  --  7.6*  ?MG  --  1.9 1.8  ? ?Liver Function Tests: ?Recent Labs  ?Lab 08/23/21 ?1500  ?AST 26  ?ALT 15  ?ALKPHOS 91  ?BILITOT 0.5  ?PROT 4.9*  ?ALBUMIN 2.2*  ? ?No results for input(s): LIPASE, AMYLASE in the last 168 hours. ?No results for input(s): AMMONIA in the last 168 hours. ?CBC: ?Recent Labs  ?Lab 08/23/21 ?1500 08/24/21 ?0400  ?WBC 5.9 7.4  ?NEUTROABS 3.2  --   ?HGB 10.3* 9.6*  ?HCT 32.6* 29.7*  ?MCV 95.3 92.0  ?PLT 328 330  ? ?Cardiac Enzymes: ?No results for input(s): CKTOTAL, CKMB, CKMBINDEX, TROPONINI in the last 168 hours. ?BNP: ?Invalid input(s): POCBNP ?CBG: ?No results for input(s): GLUCAP in the last 168 hours. ?D-Dimer ?No results for input(s): DDIMER in the last 72 hours. ?Hgb A1c ?No results for input(s): HGBA1C in the last 72 hours. ?Lipid Profile ?No results for input(s): CHOL, HDL, LDLCALC, TRIG, CHOLHDL,  LDLDIRECT in the last 72 hours. ?Thyroid function studies ?No results for input(s): TSH, T4TOTAL, T3FREE, THYROIDAB in the last 72 hours. ? ?Invalid input(s): FREET3 ?Anemia work up ?No results for input(s): VITAMI

## 2021-08-25 NOTE — TOC Progression Note (Signed)
Transition of Care (TOC) - Progression Note  ? ? ?Patient Details  ?Name: Heidi Cross ?MRN: [id number] ?Date of Birth: [birth year] ? ?Transition of Care (TOC) CM/SW Contact  ?Cassady Turano A Armentha Branagan, LCSW ?Phone Number: ?08/25/2021, 12:00 PM ? ?Clinical Narrative:  Advanced will service starting  4/11. ? ? ? ?  ?  ? ?Expected Discharge Plan and Services ?  ?  ?  ?  ?  ?Expected Discharge Date: 08/25/21               ?  ?  ?  ?  ?  ?  ?  ?  ?  ?  ? ? ?Social Determinants of Health (SDOH) Interventions ?  ? ?Readmission Risk Interventions ?   ? View : No data to display.  ?  ?  ?  ? ? ?

## 2021-08-25 NOTE — Progress Notes (Signed)
Physical Therapy Treatment ?Patient Details ?Name: Heidi Cross ?MRN: [id number] ?DOB: December 23, 1940 ?Today's Date: 08/25/2021 ? ? ?History of Present Illness 81 year old female with hypertension, depression, GERD, hyperlipidemia, hiatal hernia status post robotic hiatal hernia repair and [employer] fundoplication in 7591, who presents emergency department for chief concerns of weakness and presyncope. ? ?  ?PT Comments  ? ? Pt sitting EOB upon arrival to the room.  Pt able to perform transfers and mobility with much better technique and stability during session.  Pt notes that she is also planning to be d/c today as well to go home and has HHPT set up already.  Pt advised about exercises and was given and demonstrated exercises prior to conclusion of the session.  Current discharge plans to home with HHPT remain appropriate at this time.  Pt will continue to benefit from skilled therapy in order to address deficits listed below. ? ?   ?Recommendations for follow up therapy are one component of a multi-disciplinary discharge planning process, led by the attending physician.  Recommendations may be updated based on patient status, additional functional criteria and insurance authorization. ? ?Follow Up Recommendations ? Home health PT ?  ?  ?Assistance Recommended at Discharge Intermittent Supervision/Assistance  ?Patient can return home with the following A little help with walking and/or transfers;A little help with bathing/dressing/bathroom;Assistance with cooking/housework;Help with stairs or ramp for entrance ?  ?Equipment Recommendations ? None recommended by PT  ?  ?Recommendations for Other Services   ? ? ?  ?Precautions / Restrictions Precautions ?Precautions: Fall ?Restrictions ?Weight Bearing Restrictions: No  ?  ? ?Mobility ? Bed Mobility ?  ?  ?  ?  ?  ?  ?  ?General bed mobility comments: received seated at EOB ?  ? ?Transfers ?Overall transfer level: Needs assistance ?Equipment used: Rolling walker (2  wheels) ?Transfers: Sit to/from Stand ?Sit to Stand: Min guard ?  ?  ?  ?  ?  ?General transfer comment: pt with increased stability during mobility today. ?  ? ?Ambulation/Gait ?Ambulation/Gait assistance: Min guard ?Gait Distance (Feet): 40 Feet ?Assistive device: Rolling walker (2 wheels) ?Gait Pattern/deviations: Step-to pattern ?Gait velocity: decreased ?  ?  ?General Gait Details: ambulated around room, with safe technique with RW ? ? ?Stairs ?  ?  ?  ?  ?  ? ? ?Wheelchair Mobility ?  ? ?Modified Rankin (Stroke Patients Only) ?  ? ? ?  ?Balance Overall balance assessment: Needs assistance ?Sitting-balance support: Feet supported ?Sitting balance-Leahy Scale: Good ?  ?  ?Standing balance support: Bilateral upper extremity supported ?Standing balance-Leahy Scale: Good ?  ?  ?  ?  ?  ?  ?  ?  ?  ?  ?  ?  ?  ? ?  ?Cognition Arousal/Alertness: Awake/alert ?Behavior During Therapy: [facility] for tasks assessed/performed ?Overall Cognitive Status: Within Functional Limits for tasks assessed ?  ?  ?  ?  ?  ?  ?  ?  ?  ?  ?  ?  ?  ?  ?  ?  ?General Comments: pleasant and agreeable to session ?  ?  ? ?  ?Exercises Total Joint Exercises ?Ankle Circles/Pumps: AROM, Strengthening, Both, 10 reps, Seated ?Gluteal Sets: AROM, Strengthening, Both, 10 reps, Seated ?Long Arc Quad: AROM, Strengthening, Both, 10 reps, Seated ?Marching in Standing: AROM, Strengthening, Both, 10 reps, Seated ? ?  ?General Comments   ?  ?  ? ?Pertinent Vitals/Pain Pain Assessment ?Pain Assessment: No/denies pain  ? ? ?  Home Living   ?  ?  ?  ?  ?  ?  ?  ?  ?  ?   ?  ?Prior Function    ?  ?  ?   ? ?PT Goals (current goals can now be found in the care plan section) Acute Rehab PT Goals ?Patient Stated Goal: to go home ?PT Goal Formulation: With patient ?Time For Goal Achievement: 09/07/21 ?Potential to Achieve Goals: Good ?Progress towards PT goals: Progressing toward goals ? ?  ?Frequency ? ? ? Min 2X/week ? ? ? ?  ?PT Plan    ? ? ?Co-evaluation   ?  ?  ?  ?   ? ?  ?AM-PAC PT "6 Clicks" Mobility   ?Outcome Measure ? Help needed turning from your back to your side while in a flat bed without using bedrails?: None ?Help needed moving from lying on your back to sitting on the side of a flat bed without using bedrails?: A Little ?Help needed moving to and from a bed to a chair (including a wheelchair)?: A Little ?Help needed standing up from a chair using your arms (e.g., wheelchair or bedside chair)?: A Little ?Help needed to walk in hospital room?: A Little ?Help needed climbing 3-5 steps with a railing? : A Little ?6 Click Score: 19 ? ?  ?End of Session Equipment Utilized During Treatment: Gait belt ?Activity Tolerance: Patient tolerated treatment well ?Patient left: in chair;with chair alarm set;with family/visitor present ?Nurse Communication: Mobility status ?PT Visit Diagnosis: Unsteadiness on feet (R26.81);Muscle weakness (generalized) (M62.81);History of falling (Z91.81);Difficulty in walking, not elsewhere classified (R26.2) ?  ? ? ?Time: 0174-9449 ?PT Time Calculation (min) (ACUTE ONLY): 9 min ? ?Charges:  $Therapeutic Exercise: 8-22 mins          ?          ? ?Gwenlyn Saran, PT, DPT ?08/25/21, 2:55 PM ? ? ? ?Christie Nottingham ?08/25/2021, 2:53 PM ? ?

## 2021-08-30 ENCOUNTER — Telehealth: Payer: Self-pay | Admitting: Pharmacist

## 2021-10-30 ENCOUNTER — Other Ambulatory Visit: Payer: Self-pay | Admitting: Internal Medicine

## 2021-10-30 DIAGNOSIS — Z1231 Encounter for screening mammogram for malignant neoplasm of breast: Secondary | ICD-10-CM

## 2021-11-24 ENCOUNTER — Ambulatory Visit
Admission: RE | Admit: 2021-11-24 | Discharge: 2021-11-24 | Disposition: A | Discharge status: Home / Self Care | Payer: Medicare Other | Source: Ambulatory Visit | Attending: Internal Medicine | Admitting: Internal Medicine

## 2021-11-24 DIAGNOSIS — Z1231 Encounter for screening mammogram for malignant neoplasm of breast: Secondary | ICD-10-CM

## 2022-01-23 ENCOUNTER — Telehealth: Payer: Self-pay | Admitting: *Deleted

## 2022-01-23 NOTE — Telephone Encounter (Signed)
error 

## 2022-02-06 IMAGING — MG DIGITAL SCREENING BILAT W/ TOMO W/ CAD
8 series · 8 of 24 positions shown · non-contrast
Comparison: Previous exam(s).

CLINICAL DATA: Screening.

EXAM:
DIGITAL SCREENING BILATERAL MAMMOGRAM WITH TOMO AND CAD

[L CC synth-2D]
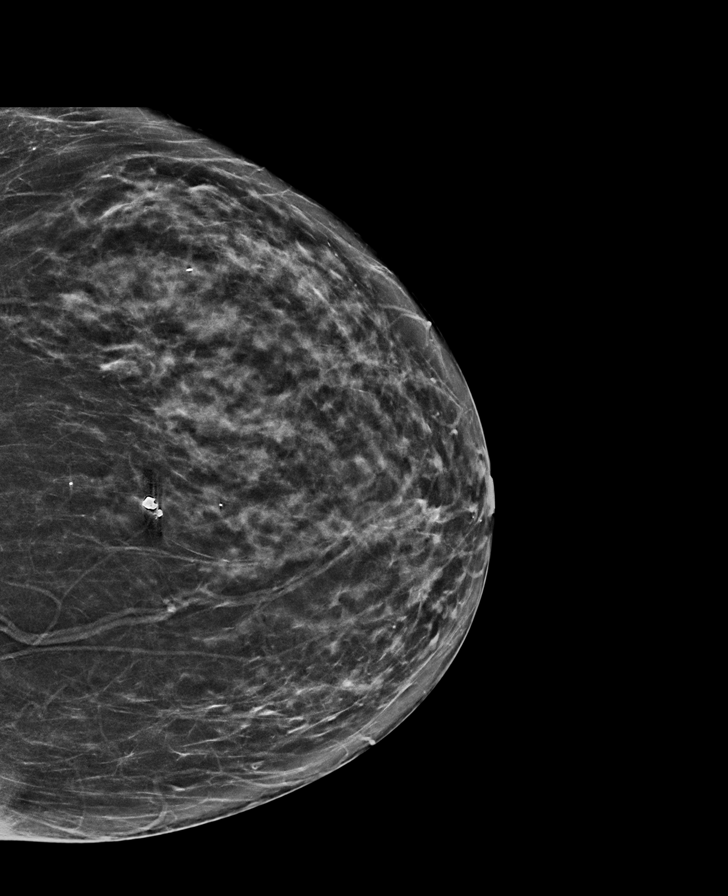

[R MLO synth-2D]
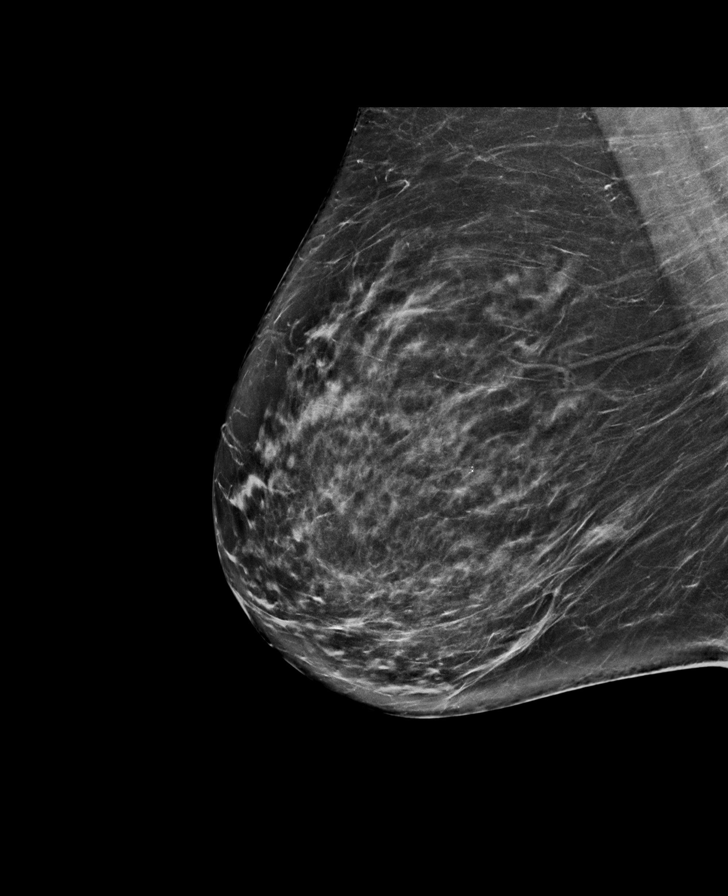

[L MLO synth-2D]
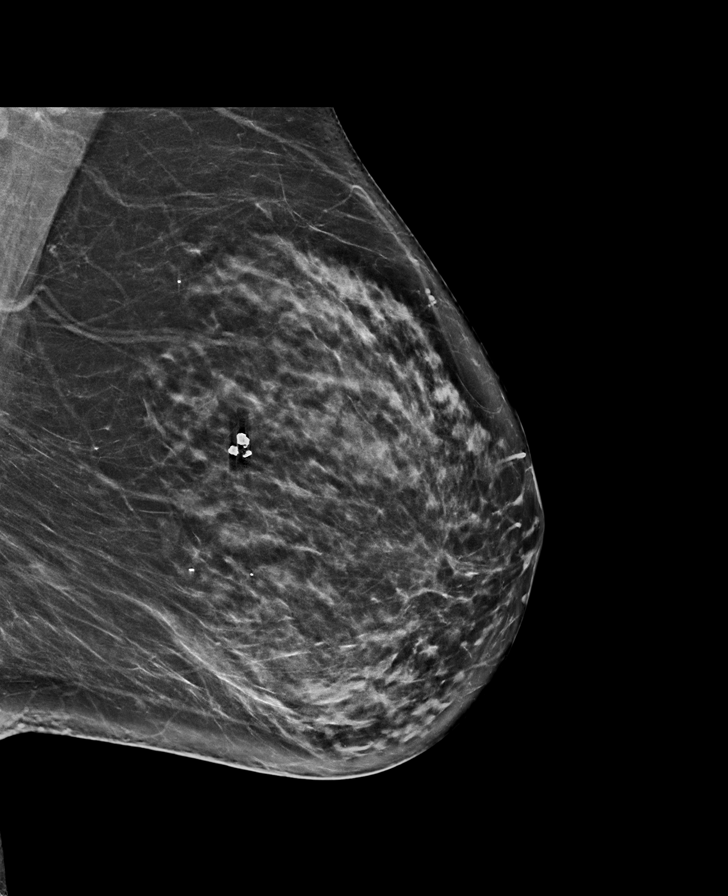

[R CC synth-2D]
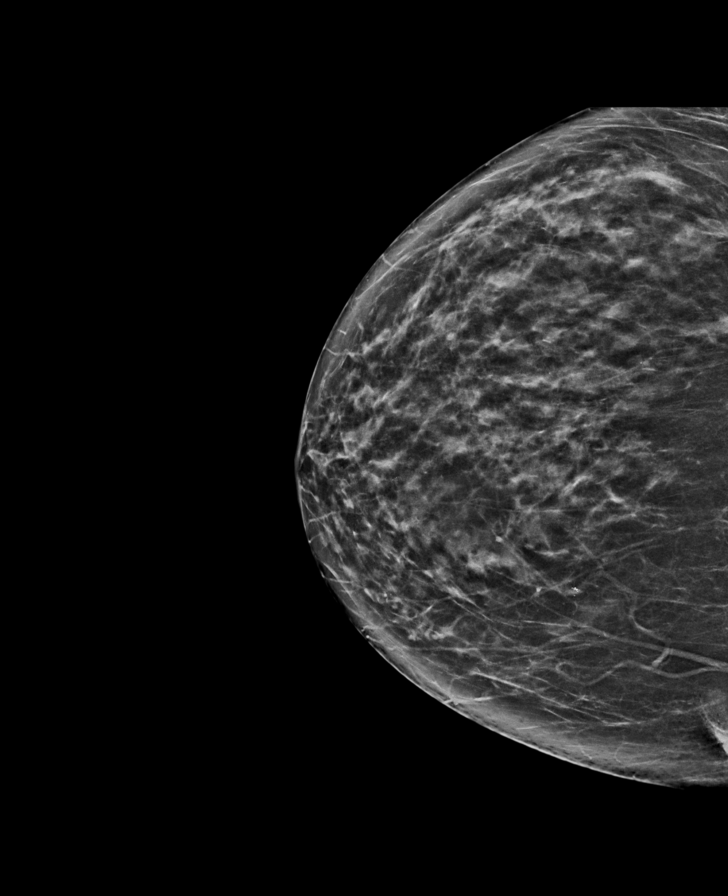

[R MLO tomo · tomo slice 39/76.0]
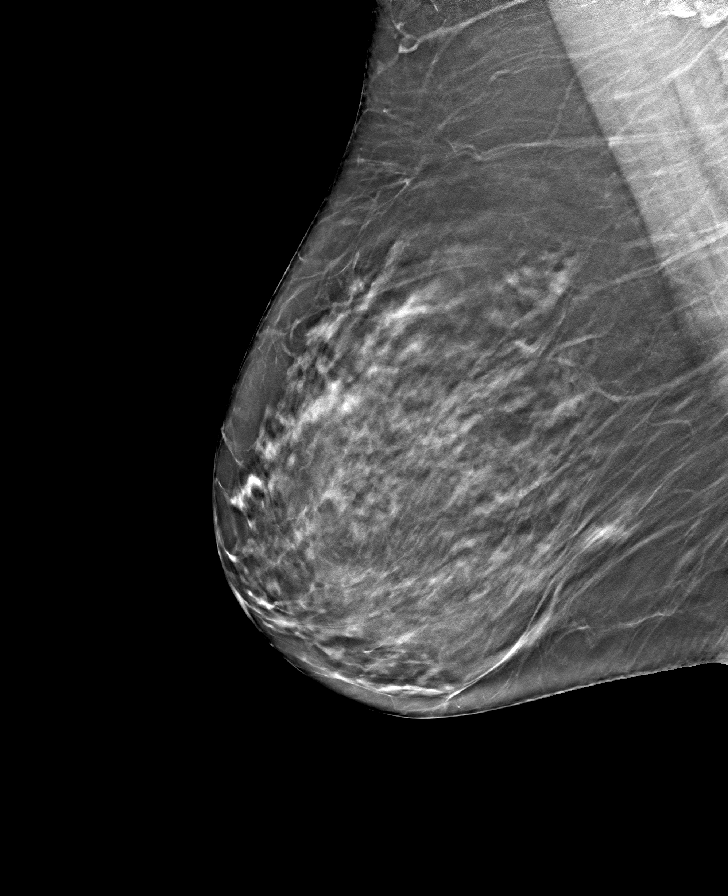

[L CC tomo · tomo slice 35/68.0]
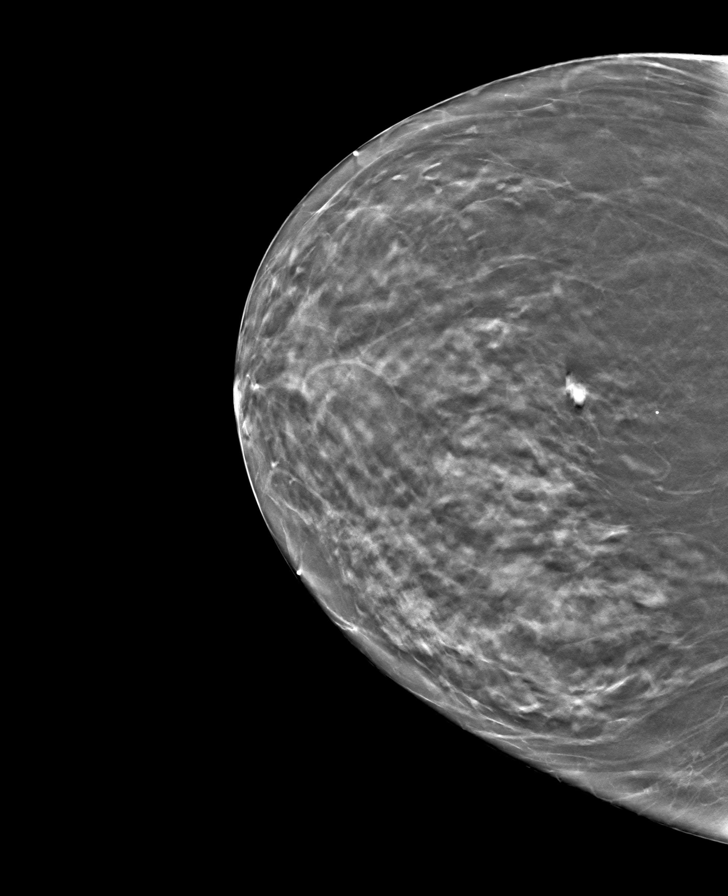

[R CC tomo · tomo slice 33/64.0]
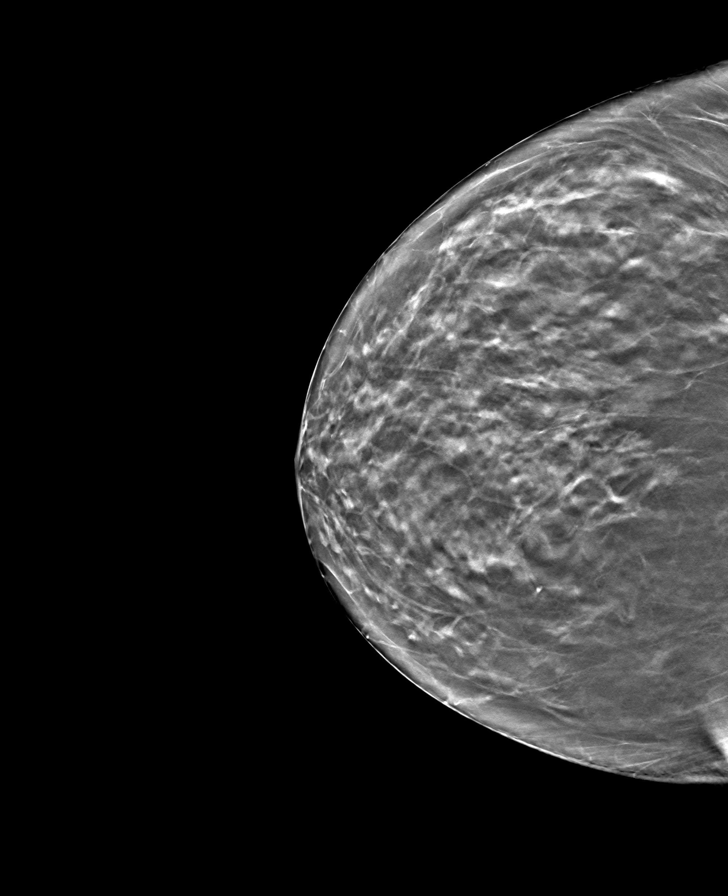

[L MLO tomo · tomo slice 39/77.0]
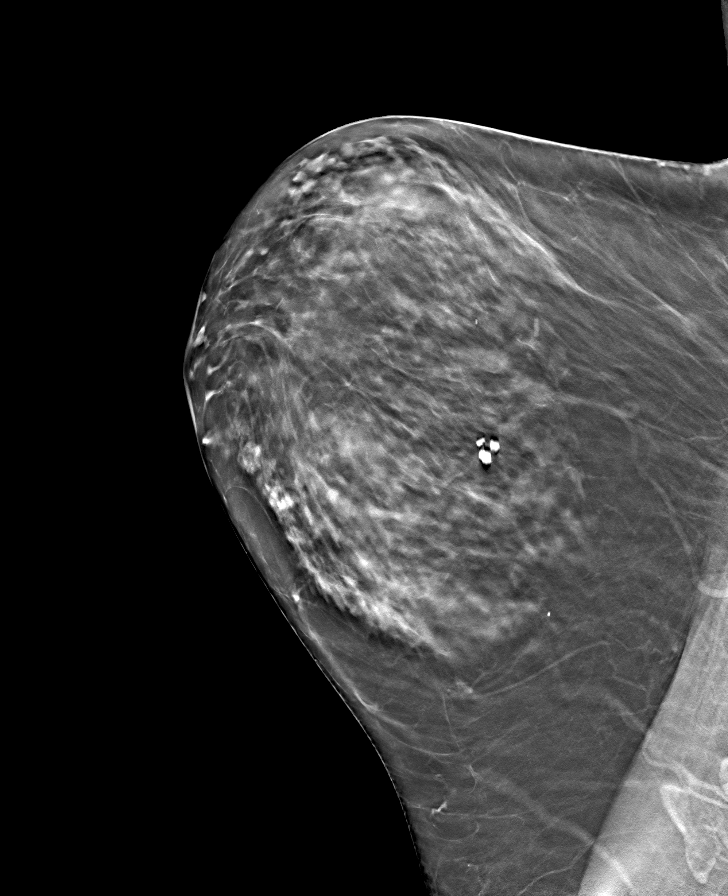

[8 of 24 positions shown; findings below may reference images not displayed]

ACR Breast Density Category c: The breast tissue is heterogeneously
dense, which may obscure small masses.
FINDINGS: There are no findings suspicious for malignancy. Images were
processed with CAD.
IMPRESSION: No mammographic evidence of malignancy. A result letter of this
screening mammogram will be mailed directly to the patient.

RECOMMENDATION:
Screening mammogram in one year. (Code:[id number])

BI-RADS CATEGORY  1: Negative.

## 2023-02-28 ENCOUNTER — Other Ambulatory Visit: Payer: Self-pay

## 2023-02-28 ENCOUNTER — Emergency Department
Admission: EM | Admit: 2023-02-28 | Discharge: 2023-02-28 | Discharge status: Against Medical Advice | Payer: Medicare Other | Attending: Emergency Medicine | Admitting: Emergency Medicine

## 2023-02-28 ENCOUNTER — Encounter: Payer: Self-pay | Admitting: *Deleted

## 2023-02-28 DIAGNOSIS — I959 Hypotension, unspecified: Secondary | ICD-10-CM | POA: Insufficient documentation

## 2023-02-28 DIAGNOSIS — I4891 Unspecified atrial fibrillation: Secondary | ICD-10-CM | POA: Diagnosis not present

## 2023-02-28 DIAGNOSIS — R5383 Other fatigue: Secondary | ICD-10-CM | POA: Diagnosis not present

## 2023-02-28 DIAGNOSIS — R0602 Shortness of breath: Secondary | ICD-10-CM | POA: Insufficient documentation

## 2023-02-28 LAB — BASIC METABOLIC PANEL
Anion gap: 13 (ref 5–15)
BUN: 18 mg/dL (ref 8–23)
CO2: 24 mmol/L (ref 22–32)
Calcium: 9 mg/dL (ref 8.9–10.3)
Chloride: 99 mmol/L (ref 98–111)
Creatinine, Ser: 1.13 mg/dL — ABNORMAL HIGH (ref 0.44–1.00)
GFR, Estimated: 49 mL/min — ABNORMAL LOW (ref >60)
Glucose, Bld: 107 mg/dL — ABNORMAL HIGH (ref 70–99)
Potassium: 4.2 mmol/L (ref 3.5–5.1)
Sodium: 136 mmol/L (ref 135–145)

## 2023-02-28 LAB — TROPONIN I (HIGH SENSITIVITY): Troponin I (High Sensitivity): 8 ng/L (ref <18)

## 2023-02-28 LAB — CBC WITH DIFFERENTIAL/PLATELET
Abs Immature Granulocytes: 0.01 10*3/uL (ref 0.00–0.07)
Basophils Absolute: 0 10*3/uL (ref 0.0–0.1)
Basophils Relative: 1 %
Eosinophils Absolute: 0.2 10*3/uL (ref 0.0–0.5)
Eosinophils Relative: 3 %
HCT: 36.8 % (ref 36.0–46.0)
Hemoglobin: 11.9 g/dL — ABNORMAL LOW (ref 12.0–15.0)
Immature Granulocytes: 0 %
Lymphocytes Relative: 32 %
Lymphs Abs: 2.1 10*3/uL (ref 0.7–4.0)
MCH: 31.6 pg (ref 26.0–34.0)
MCHC: 32.3 g/dL (ref 30.0–36.0)
MCV: 97.9 fL (ref 80.0–100.0)
Monocytes Absolute: 0.5 10*3/uL (ref 0.1–1.0)
Monocytes Relative: 8 %
Neutro Abs: 3.7 10*3/uL (ref 1.7–7.7)
Neutrophils Relative %: 56 %
Platelets: 265 10*3/uL (ref 150–400)
RBC: 3.76 MIL/uL — ABNORMAL LOW (ref 3.87–5.11)
RDW: 12.3 % (ref 11.5–15.5)
WBC: 6.5 10*3/uL (ref 4.0–10.5)
nRBC: 0 % (ref 0.0–0.2)

## 2023-02-28 LAB — MAGNESIUM: Magnesium: 2.2 mg/dL (ref 1.7–2.4)

## 2023-02-28 MED ORDER — SODIUM CHLORIDE 0.9 % IV BOLUS
500.0000 mL | Freq: Once | INTRAVENOUS | Status: AC
Start: 2023-02-28 — End: 2023-02-28
  Administered 2023-02-28: 500 mL via INTRAVENOUS

## 2023-02-28 NOTE — ED Triage Notes (Signed)
Pt sent to ER for eval from [facility] for hypotension and afib-new onset.  Pt denies chest pain or sob.  Pt alert  speech clear.

## 2023-02-28 NOTE — ED Notes (Addendum)
This RN walked past room and noticed patient was not in bed. Notified MD.

## 2023-02-28 NOTE — ED Notes (Signed)
IV catheter and extension set in trashcan

## 2023-02-28 NOTE — ED Provider Notes (Signed)
[facility] Provider Note    Event Date/Time   First MD Initiated Contact with Patient 02/28/23 1613     (approximate)   History   Hypotension and Irregular Heart Beat   HPI  Heidi Cross is a 82 y.o. female presents to the emergent C department today from primary care doctor's office because of concerns for an ACE onset A-fib and low blood pressure.  The patient states that for the past couple weeks she has not felt great.  This past week has been worse.  She has gotten short of breath with exertion.  She did go to an appointment for her back today where she complained of feeling short of breath and fatigued and they checked her blood pressure and found it to be low.  This prompted them to send her to her primary care doctor's office where they were concerned for possible A-fib.  Patient denies any chest pain.  Denies any new medications or change in medications.     Physical Exam   Triage Vital Signs: ED Triage Vitals  Encounter Vitals Group     BP 02/28/23 1600 (!) 109/55     Systolic BP Percentile --      Diastolic BP Percentile --      Pulse Rate 02/28/23 1600 (!) 114     Resp 02/28/23 1600 20     Temp 02/28/23 1600 98.3 F (36.8 C)     Temp src --      SpO2 02/28/23 1600 98 %     Weight 02/28/23 1601 180 lb (81.6 kg)     Height 02/28/23 1601 5\' 9"  (1.753 m)     Head Circumference --      Peak Flow --      Pain Score 02/28/23 1601 0     Pain Loc --      Pain Education --      Exclude from Growth Chart --     Most recent vital signs: Vitals:   02/28/23 1600 02/28/23 1610  BP: (!) 109/55 136/72  Pulse: (!) 114 92  Resp: 20 18  Temp: 98.3 F (36.8 C)   SpO2: 98% 99%   General: Awake, alert, oriented. CV:  Good peripheral perfusion. Irregularly irregular rhythm. Resp:  Normal effort. Lungs clear. Abd:  No distention.    ED Results / Procedures / Treatments   Labs (all labs ordered are listed, but only abnormal results are  displayed) Labs Reviewed  CBC WITH DIFFERENTIAL/PLATELET - Abnormal; Notable for the following components:      Result Value   RBC 3.76 (*)    Hemoglobin 11.9 (*)    All other components within normal limits  BASIC METABOLIC PANEL - Abnormal; Notable for the following components:   Glucose, Bld 107 (*)    Creatinine, Ser 1.13 (*)    GFR, Estimated 49 (*)    All other components within normal limits  MAGNESIUM  TROPONIN I (HIGH SENSITIVITY)  TROPONIN I (HIGH SENSITIVITY)     EKG  I, Phineas Semen, attending physician, personally viewed and interpreted this EKG  EKG Time: 1602 Rate: 111 Rhythm: atrial fibrillation Axis: left axis deviation Intervals: qtc 397 QRS: narrow ST changes: no st elevation Impression: abnormal ekg   RADIOLOGY None  PROCEDURES:  Critical Care performed: No   MEDICATIONS ORDERED IN ED: Medications - No data to display   IMPRESSION / MDM / ASSESSMENT AND PLAN / ED COURSE  I reviewed the triage vital signs  and the nursing notes.                              Differential diagnosis includes, but is not limited to, new onset afib, electrolyte abnormality, heart damage  Patient's presentation is most consistent with acute presentation with potential threat to life or bodily function.   The patient is on the cardiac monitor to evaluate for evidence of arrhythmia and/or significant heart rate changes.  Patient presents to the emergency department today from primary care doctor's office because of concerns for new onset A-fib and low blood pressure.  EKG here is consistent with A-fib.  Rate here in the emergency department is fairly well-controlled.  Will check blood work.  Blood work shows slight creatinine elevation.  Patient without concerning electrolyte abnormality or troponin elevation.  Patient however left the emergency department prior to my opportunity to discuss findings with her and plan of care.      FINAL CLINICAL  IMPRESSION(S) / ED DIAGNOSES   Final diagnoses:  Atrial fibrillation, unspecified type [facility])     Note:  This document was prepared using Dragon voice recognition software and may include unintentional dictation errors.    Phineas Semen, MD 02/28/23 [phone]

## 2023-02-28 NOTE — ED Notes (Signed)
Called pt to see if someone took pt's IV out before pt left. Pt did not answer and voice mail box was full so could not leave message.

## 2023-03-26 DIAGNOSIS — I4891 Unspecified atrial fibrillation: Secondary | ICD-10-CM

## 2023-03-26 HISTORY — DX: Unspecified atrial fibrillation: I48.91

## 2023-09-10 DIAGNOSIS — I480 Paroxysmal atrial fibrillation: Secondary | ICD-10-CM

## 2023-09-10 HISTORY — DX: Paroxysmal atrial fibrillation: I48.0

## 2023-11-05 ENCOUNTER — Other Ambulatory Visit: Payer: Self-pay | Admitting: Internal Medicine

## 2023-11-05 ENCOUNTER — Ambulatory Visit
Admission: RE | Admit: 2023-11-05 | Discharge: 2023-11-05 | Disposition: A | Discharge status: Home / Self Care | Source: Ambulatory Visit | Attending: Internal Medicine | Admitting: Internal Medicine

## 2023-11-05 DIAGNOSIS — R000 Tachycardia, unspecified: Secondary | ICD-10-CM

## 2023-11-05 DIAGNOSIS — R0609 Other forms of dyspnea: Secondary | ICD-10-CM | POA: Diagnosis present

## 2023-11-05 LAB — POCT I-STAT CREATININE: Creatinine, Ser: 1.3 mg/dL — ABNORMAL HIGH (ref 0.44–1.00)

## 2023-11-05 MED ORDER — IOHEXOL 350 MG/ML SOLN
75.0000 mL | Freq: Once | INTRAVENOUS | Status: AC | PRN
  Administered 2023-11-05: 75 mL via INTRAVENOUS

## 2023-12-17 ENCOUNTER — Other Ambulatory Visit: Payer: Self-pay | Admitting: Internal Medicine

## 2023-12-17 DIAGNOSIS — Z1231 Encounter for screening mammogram for malignant neoplasm of breast: Secondary | ICD-10-CM

## 2024-01-02 ENCOUNTER — Encounter

## 2024-01-28 ENCOUNTER — Ambulatory Visit
Admission: RE | Admit: 2024-01-28 | Discharge: 2024-01-28 | Disposition: A | Discharge status: Home / Self Care | Source: Ambulatory Visit | Attending: Internal Medicine | Admitting: Internal Medicine

## 2024-01-28 DIAGNOSIS — Z1231 Encounter for screening mammogram for malignant neoplasm of breast: Secondary | ICD-10-CM | POA: Diagnosis present

## 2024-03-02 NOTE — Discharge Instructions (Signed)

## 2024-03-03 ENCOUNTER — Encounter: Payer: Self-pay | Admitting: Ophthalmology

## 2024-03-03 NOTE — Anesthesia Preprocedure Evaluation (Addendum)
 Anesthesia Evaluation  Patient identified by MRN, date of birth, ID band Patient awake    Reviewed: Allergy & Precautions, H&P , NPO status , Patient's Chart, lab work & pertinent test results  Airway Mallampati: III  TM Distance: <3 FB Neck ROM: Full    Dental no notable dental hx. (+) Partial Lower, Caps Upper central and lateral incisors caps:   Pulmonary neg pulmonary ROS, shortness of breath CT 11-05-23   Lungs/Pleura: No pleural effusion or pneumothorax. Biapical pleuroparenchymal thickening and scarring. Scattered ground-glass opacities. Unchanged 5 mm left upper lobe pulmonary nodule on image 29 of CT series 4.  IMPRESSION: 1. No evidence of pulmonary embolus to the segmental level. 2. Mildly enlarged heart. 3. Coronary artery atherosclerotic vascular disease. 4. Chronic pulmonary findings detailed above.    Pulmonary exam normal breath sounds clear to auscultation       Cardiovascular negative cardio ROS Normal cardiovascular exam+ dysrhythmias  Rhythm:Regular Rate:Normal  02-28-23 EKG EKG Time: 1602 Rate: 111 Rhythm: atrial fibrillation Axis: left axis deviation Intervals: qtc 397 QRS: narrow ST changes: no st elevation Impression: abnormal ekg  Has had some episodes of spontaneously resolving atrial fib, chest pain with dyspnea seen in ER that resolved.    Neuro/Psych  Headaches PSYCHIATRIC DISORDERS Anxiety Depression     Neuromuscular disease negative neurological ROS  negative psych ROS   GI/Hepatic negative GI ROS, Neg liver ROS, hiatal hernia,GERD  ,,  Endo/Other  negative endocrine ROS    Renal/GU negative Renal ROS  negative genitourinary   Musculoskeletal negative musculoskeletal ROS (+) Arthritis ,    Abdominal   Peds negative pediatric ROS (+)  Hematology negative hematology ROS (+) Blood dyscrasia, anemia   Anesthesia Other Findings DOE after ankle fracture in June.  GERD  (gastroesophageal reflux disease) Environmental allergies Insomnia  Spinal cord tumor Wears contact lenses  Syncope Shortness of breath dyspnea  Anemia Mild atherosclerosis of both carotid arteries DDD (degenerative disc disease), lumbar Depression  Hyperlipidemia Urge incontinence  Cancer (HCC) Arthritis      Reproductive/Obstetrics negative OB ROS                              Anesthesia Physical Anesthesia Plan  ASA: 3  Anesthesia Plan: MAC   Post-op Pain Management:    Induction: Intravenous  PONV Risk Score and Plan:   Airway Management Planned: Natural Airway and Nasal Cannula  Additional Equipment:   Intra-op Plan:   Post-operative Plan:   Informed Consent: I have reviewed the patients History and Physical, chart, labs and discussed the procedure including the risks, benefits and alternatives for the proposed anesthesia with the patient or authorized representative who has indicated his/her understanding and acceptance.     Dental Advisory Given  Plan Discussed with: Anesthesiologist, CRNA and Surgeon  Anesthesia Plan Comments: (Patient consented for risks of anesthesia including but not limited to:  - adverse reactions to medications - damage to eyes, teeth, lips or other oral mucosa - nerve damage due to positioning  - sore throat or hoarseness - Damage to heart, brain, nerves, lungs, other parts of body or loss of life  Patient voiced understanding and assent.)         Anesthesia Quick Evaluation

## 2024-03-04 ENCOUNTER — Other Ambulatory Visit: Payer: Self-pay

## 2024-03-04 ENCOUNTER — Ambulatory Visit
Admission: RE | Admit: 2024-03-04 | Discharge: 2024-03-04 | Disposition: A | Discharge status: Home / Self Care | Attending: Ophthalmology | Admitting: Ophthalmology

## 2024-03-04 ENCOUNTER — Ambulatory Visit: Admitting: Anesthesiology

## 2024-03-04 ENCOUNTER — Encounter: Admitting: Anesthesiology

## 2024-03-04 ENCOUNTER — Encounter: Payer: Self-pay | Admitting: Ophthalmology

## 2024-03-04 ENCOUNTER — Encounter
Admission: RE | Disposition: A | Discharge status: Home / Self Care | Payer: Self-pay | Source: Home / Self Care | Attending: Ophthalmology

## 2024-03-04 DIAGNOSIS — F32A Depression, unspecified: Secondary | ICD-10-CM | POA: Diagnosis not present

## 2024-03-04 DIAGNOSIS — D649 Anemia, unspecified: Secondary | ICD-10-CM | POA: Diagnosis not present

## 2024-03-04 DIAGNOSIS — H2512 Age-related nuclear cataract, left eye: Secondary | ICD-10-CM | POA: Diagnosis present

## 2024-03-04 DIAGNOSIS — F419 Anxiety disorder, unspecified: Secondary | ICD-10-CM | POA: Diagnosis not present

## 2024-03-04 DIAGNOSIS — Z79899 Other long term (current) drug therapy: Secondary | ICD-10-CM | POA: Insufficient documentation

## 2024-03-04 DIAGNOSIS — R519 Headache, unspecified: Secondary | ICD-10-CM | POA: Insufficient documentation

## 2024-03-04 DIAGNOSIS — K449 Diaphragmatic hernia without obstruction or gangrene: Secondary | ICD-10-CM | POA: Diagnosis not present

## 2024-03-04 DIAGNOSIS — K219 Gastro-esophageal reflux disease without esophagitis: Secondary | ICD-10-CM | POA: Diagnosis not present

## 2024-03-04 DIAGNOSIS — Z7982 Long term (current) use of aspirin: Secondary | ICD-10-CM | POA: Diagnosis not present

## 2024-03-04 DIAGNOSIS — M1990 Unspecified osteoarthritis, unspecified site: Secondary | ICD-10-CM | POA: Insufficient documentation

## 2024-03-04 HISTORY — DX: Unspecified right bundle-branch block: I45.10

## 2024-03-04 HISTORY — DX: Polyneuropathy, unspecified: G62.9

## 2024-03-04 HISTORY — DX: Unspecified osteoarthritis, unspecified site: M19.90

## 2024-03-04 HISTORY — DX: Diaphragmatic hernia without obstruction or gangrene: K44.9

## 2024-03-04 HISTORY — DX: Migraine, unspecified, not intractable, without status migrainosus: G43.909

## 2024-03-04 HISTORY — PX: CATARACT EXTRACTION W/PHACO: SHX586

## 2024-03-04 HISTORY — DX: Anxiety disorder, unspecified: F41.9

## 2024-03-04 SURGERY — PHACOEMULSIFICATION, CATARACT, WITH IOL INSERTION
Anesthesia: Monitor Anesthesia Care | Site: Eye | Laterality: Left

## 2024-03-04 MED ORDER — LACTATED RINGERS IV SOLN: INTRAVENOUS | Status: DC

## 2024-03-04 MED ORDER — ARMC OPHTHALMIC DILATING DROPS
OPHTHALMIC | Status: AC
  Filled 2024-03-04: qty 0.5

## 2024-03-04 MED ORDER — FENTANYL CITRATE (PF) 100 MCG/2ML IJ SOLN
INTRAMUSCULAR | Status: DC | PRN
  Administered 2024-03-04: 50 ug via INTRAVENOUS

## 2024-03-04 MED ORDER — BRIMONIDINE TARTRATE-TIMOLOL 0.2-0.5 % OP SOLN
OPHTHALMIC | Status: DC | PRN
  Administered 2024-03-04: 1 [drp] via OPHTHALMIC

## 2024-03-04 MED ORDER — TETRACAINE HCL 0.5 % OP SOLN
1.0000 [drp] | OPHTHALMIC | Status: DC | PRN
  Administered 2024-03-04 (×3): 1 [drp] via OPHTHALMIC

## 2024-03-04 MED ORDER — TETRACAINE HCL 0.5 % OP SOLN
OPHTHALMIC | Status: AC
  Filled 2024-03-04: qty 4

## 2024-03-04 MED ORDER — FENTANYL CITRATE (PF) 100 MCG/2ML IJ SOLN
INTRAMUSCULAR | Status: AC
  Filled 2024-03-04: qty 2

## 2024-03-04 MED ORDER — ARMC OPHTHALMIC DILATING DROPS
1.0000 | OPHTHALMIC | Status: DC | PRN
  Administered 2024-03-04 (×3): 1 via OPHTHALMIC

## 2024-03-04 MED ORDER — SIGHTPATH DOSE#1 BSS IO SOLN
INTRAOCULAR | Status: DC | PRN
  Administered 2024-03-04: 15 mL via INTRAOCULAR

## 2024-03-04 MED ORDER — MIDAZOLAM HCL 2 MG/2ML IJ SOLN
INTRAMUSCULAR | Status: AC
  Filled 2024-03-04: qty 2

## 2024-03-04 MED ORDER — SIGHTPATH DOSE#1 BSS IO SOLN
INTRAOCULAR | Status: DC | PRN
  Administered 2024-03-04: 70 mL via OPHTHALMIC

## 2024-03-04 MED ORDER — CEFUROXIME OPHTHALMIC INJECTION 1 MG/0.1 ML
INJECTION | OPHTHALMIC | Status: DC | PRN
  Administered 2024-03-04: 1 mg via INTRACAMERAL

## 2024-03-04 MED ORDER — MIDAZOLAM HCL 2 MG/2ML IJ SOLN
INTRAMUSCULAR | Status: DC | PRN
  Administered 2024-03-04: .5 mg via INTRAVENOUS

## 2024-03-04 MED ORDER — LIDOCAINE HCL (PF) 2 % IJ SOLN
INTRAOCULAR | Status: DC | PRN
  Administered 2024-03-04: 2 mL

## 2024-03-04 MED ORDER — SIGHTPATH DOSE#1 NA HYALUR & NA CHOND-NA HYALUR IO KIT
PACK | INTRAOCULAR | Status: DC | PRN
  Administered 2024-03-04: 1 via OPHTHALMIC

## 2024-03-04 SURGICAL SUPPLY — 8 items
FEE CATARACT SUITE SIGHTPATH (MISCELLANEOUS) ×1 IMPLANT
GLOVE BIOGEL PI IND STRL 8 (GLOVE) ×1 IMPLANT
GLOVE SURG LX STRL 7.5 STRW (GLOVE) ×1 IMPLANT
GLOVE SURG SYN 6.5 PF PI BL (GLOVE) ×1 IMPLANT
LENS IOL TECNIS EYHANCE 23.5 (Intraocular Lens) IMPLANT
NDL FILTER BLUNT 18X1 1/2 (NEEDLE) ×1 IMPLANT
NEEDLE FILTER BLUNT 18X1 1/2 (NEEDLE) ×1 IMPLANT
SYR 3ML LL SCALE MARK (SYRINGE) ×1 IMPLANT

## 2024-03-04 NOTE — Transfer of Care (Signed)
 Immediate Anesthesia Transfer of Care Note  Patient: Heidi Cross  Procedure(s) Performed: PHACOEMULSIFICATION, CATARACT, WITH IOL INSERTION 14.67 01:17.4 (Left: Eye)  Patient Location: PACU  Anesthesia Type: MAC  Level of Consciousness: awake, alert  and patient cooperative  Airway and Oxygen Therapy: Patient Spontanous Breathing   Post-op Assessment: Post-op Vital signs reviewed, Patient's Cardiovascular Status Stable, Respiratory Function Stable, Patent Airway and No signs of Nausea or vomiting  Post-op Vital Signs: Reviewed and stable  Complications: No notable events documented.

## 2024-03-04 NOTE — Op Note (Signed)
 OPERATIVE NOTE  Heidi Cross [id number] 03/04/2024   PREOPERATIVE DIAGNOSIS:  Nuclear sclerotic cataract left eye. H25.12   POSTOPERATIVE DIAGNOSIS:    Nuclear sclerotic cataract left eye.     PROCEDURE:  Phacoemusification with posterior chamber intraocular lens placement of the left eye  Ultrasound time: Procedure(s): PHACOEMULSIFICATION, CATARACT, WITH IOL INSERTION 14.67 01:17.4 (Left)  LENS:   Implant Name Type Inv. Item Serial No. Manufacturer Lot No. LRB No. Used Action  LENS IOL TECNIS EYHANCE 23.5 - [id number] Intraocular Lens LENS IOL TECNIS EYHANCE 23.5 [id number] [employer]  Left 1 Implanted      SURGEON:  Dene FABIENE Etienne, MD   ANESTHESIA:  Topical with tetracaine drops and 2% Xylocaine  jelly, augmented with 1% preservative-free intracameral lidocaine .    COMPLICATIONS:  None.   DESCRIPTION OF PROCEDURE:  The patient was identified in the holding room and transported to the operating room and placed in the supine position under the operating microscope.  The left eye was identified as the operative eye and it was prepped and draped in the usual sterile ophthalmic fashion.   A 1 millimeter clear-corneal paracentesis was made at the 1:30 position.  0.5 ml of preservative-free 1% lidocaine  was injected into the anterior chamber.  The anterior chamber was filled with Viscoat viscoelastic.  A 2.4 millimeter keratome was used to make a near-clear corneal incision at the 10:30 position.  .  A curvilinear capsulorrhexis was made with a cystotome and capsulorrhexis forceps.  Balanced salt solution was used to hydrodissect and hydrodelineate the nucleus.   Phacoemulsification was then used in stop and chop fashion to remove the lens nucleus and epinucleus.  The remaining cortex was then removed using the irrigation and aspiration handpiece. Provisc was then placed into the capsular bag to distend it for lens placement.  A lens was then injected into the capsular bag.  The  remaining viscoelastic was aspirated.   Wounds were hydrated with balanced salt solution.  The anterior chamber was inflated to a physiologic pressure with balanced salt solution.  No wound leaks were noted. Cefuroxime 0.1 ml of a 10mg /ml solution was injected into the anterior chamber for a dose of 1 mg of intracameral antibiotic at the completion of the case.   Timolol and Brimonidine drops were applied to the eye.  The patient was taken to the recovery room in stable condition without complications of anesthesia or surgery.  Heidi Cross 03/04/2024, 12:35 PM

## 2024-03-04 NOTE — H&P (Signed)
 [facility]   Primary Care Physician:  Cleotilde Oneil FALCON, MD Ophthalmologist: Dr. Dene Etienne  Pre-Procedure History & Physical: HPI:  Heidi Cross is a 83 y.o. female here for ophthalmic surgery.   Past Medical History:  Diagnosis Date   Anemia     when I was young and when I was pregnant    Anxiety    Arthritis    Atrial fibrillation (HCC) 03/26/2023   spontaneous episode of atrial fibrillation.   Cancer (HCC)    Basal Cell Carcinoma face   DDD (degenerative disc disease), lumbar    Depression    Environmental allergies    GERD (gastroesophageal reflux disease)    Hiatal hernia    Hyperlipidemia    Insomnia    Migraines    Mild atherosclerosis of both carotid arteries    Neuropathy    Paroxysmal atrial fibrillation (HCC) 09/10/2023   Paroxysmal A-fib-symptomatic at times, will use metoprolol  as needed, overall pretty rare Dr. Cleotilde note   Right bundle branch block    Shortness of breath dyspnea    with exertion   Spinal cord tumor    lumbar   Syncope    Urge incontinence    Wears contact lenses     Past Surgical History:  Procedure Laterality Date   ABDOMINAL HYSTERECTOMY     CHOLECYSTECTOMY     COLONOSCOPY W/ BIOPSIES AND POLYPECTOMY     x3   COLONOSCOPY WITH PROPOFOL  N/A 09/02/2017   Procedure: COLONOSCOPY WITH PROPOFOL ;  Surgeon: Viktoria Lamar DASEN, MD;  Location: [facility] ENDOSCOPY;  Service: Endoscopy;  Laterality: N/A;   DILATION AND CURETTAGE OF UTERUS     ESOPHAGOGASTRODUODENOSCOPY N/A 09/02/2017   Procedure: ESOPHAGOGASTRODUODENOSCOPY (EGD);  Surgeon: Viktoria Lamar DASEN, MD;  Location[facility] ENDOSCOPY;  Service: Endoscopy;  Laterality: N/A;   ESOPHAGOGASTRODUODENOSCOPY N/A 12/29/2020   Procedure: ESOPHAGOGASTRODUODENOSCOPY (EGD);  Surgeon: Onita Elspeth Sharper, DO;  Location: [facility] ENDOSCOPY;  Service: Gastroenterology;  Laterality: N/A;   LAMINECTOMY N/A 11/03/2015   Procedure: L1-2 Laminectomy for resection of intradural tumor;  Surgeon:  Reyes Budge, MD;  Location: [facility] NEURO ORS;  Service: Neurosurgery;  Laterality: N/A;  L1-2 Laminectomy for resection of intradural tumor   LEFT HEART CATH AND CORONARY ANGIOGRAPHY Left 06/06/2017   Procedure: LEFT HEART CATH AND CORONARY ANGIOGRAPHY;  Surgeon: Ammon Blunt, MD;  Location: [facility] INVASIVE CV LAB;  Service: Cardiovascular;  Laterality: Left;   TONSILLECTOMY     TOTAL KNEE ARTHROPLASTY Right 08/13/2019   Procedure: RIGHT TOTAL KNEE ARTHROPLASTY;  Surgeon: Edie Norleen PARAS, MD;  Location: [facility] ORS;  Service: Orthopedics;  Laterality: Right;   XI ROBOTIC ASSISTED HIATAL HERNIA REPAIR N/A 04/04/2021   Procedure: XI ROBOTIC ASSISTED HIATAL HERNIA REPAIR WITH NISSEN FUNDOPLICATION;  Surgeon: Gladis Cough, MD;  Location: [facility] ORS;  Service: General;  Laterality: N/A;    Prior to Admission medications   Medication Sig Start Date End Date Taking? Authorizing Provider  ALPRAZolam (NIRAVAM) 0.25 MG dissolvable tablet Take 0.25 mg by mouth at bedtime as needed for anxiety.   Yes [provider]  aspirin  EC 81 MG tablet Take 81 mg by mouth daily. Swallow whole.   Yes [provider]  buPROPion  (WELLBUTRIN  SR) 150 MG 12 hr tablet Take 150 mg by mouth 2 (two) times daily.   Yes [provider]  Estriol Micronized POWD 1 application  by Does not apply route daily.   Yes [provider]  etodolac (LODINE XL) 400 MG 24 hr tablet Take 400  mg by mouth daily.   Yes [provider]  famotidine  (PEPCID ) 40 MG tablet Take 40 mg by mouth daily.   Yes [provider]  losartan (COZAAR) 25 MG tablet Take 25 mg by mouth daily.   Yes [provider]  metoprolol  tartrate (LOPRESSOR ) 25 MG tablet Take 25 mg by mouth daily.   Yes [provider]  Polyethyl Glycol-Propyl Glycol (SYSTANE ULTRA) 0.4-0.3 % SOLN Apply 1 drop to eye as needed.   Yes [provider]  Calcium  Carbonate-Vitamin D  500-125 MG-UNIT TABS Take 1 tablet by  mouth every other day.    [provider]  cyclobenzaprine  (FLEXERIL ) 10 MG tablet Take 10 mg by mouth 2 (two) times daily as needed for muscle spasms. 07/07/18   [provider]  fluticasone  (FLONASE ) 50 MCG/ACT nasal spray Place 1 spray into both nostrils daily as needed for allergies or rhinitis.    [provider]  gabapentin  (NEURONTIN ) 300 MG capsule Take 300-600 mg by mouth. 05/27/18   [provider]  Glucosamine-Chondroitin 500-400 MG CAPS Take 1 tablet by mouth daily.  04/15/08   [provider]  Multiple Vitamins-Minerals (QC WOMENS DAILY MULTIVITAMIN) TABS Take 1 tablet by mouth daily.  04/15/08   [provider]  pantoprazole  (PROTONIX ) 40 MG tablet Take 1 tablet (40 mg total) by mouth daily. 04/05/21   Gladis Cough, MD  PRESCRIPTION MEDICATION Place 1 application vaginally 2 (two) times a week. Compounded Med - Estriol 1 mg    [provider]  traZODone  (DESYREL ) 50 MG tablet Take 50 mg by mouth at bedtime.    [provider]  venlafaxine XR (EFFEXOR-XR) 37.5 MG 24 hr capsule Take 37.5 mg by mouth daily with breakfast.    [provider]    Allergies as of 01/29/2024   (No Known Allergies)    Family History  Problem Relation Age of Onset   Diabetes Mother    Cancer Mother    Hypertension Mother    Cancer Father    Breast cancer Neg Hx     Social History   Socioeconomic History   Marital status: Married    Spouse name: Not on file   Number of children: Not on file   Years of education: Not on file   Highest education level: Not on file  Occupational History   Not on file  Tobacco Use   Smoking status: Never   Smokeless tobacco: Never  Vaping Use   Vaping status: Never Used  Substance and Sexual Activity   Alcohol use: Yes    Comment: occasional wine  1 week   Drug use: No   Sexual activity: Not on file  Other Topics Concern   Not on file  Social History Narrative   Not on file    Social Drivers of Health   Financial Resource Strain: Low Risk  (09/24/2023)   [facility]   Overall Financial Resource Strain (CARDIA)    Difficulty of Paying Living Expenses: Not very hard  Food Insecurity: No Food Insecurity (09/24/2023)   [facility]   Hunger Vital Sign    Within the past 12 months, you worried that your food would run out before you got the money to buy more.: Never true    Within the past 12 months, the food you bought just didn't last and you didn't have money to get more.: Never true  Transportation Needs: No Transportation Needs (09/24/2023)  Received from [facility] - Transportation    In the past 12 months, has lack of transportation kept you from medical appointments or from getting medications?: No    Lack of Transportation (Non-Medical): No  Physical Activity: Not on file  Stress: Not on file  Social Connections: Not on file  Intimate Partner Violence: Not on file    Review of Systems: See HPI, otherwise negative ROS  Physical Exam: Ht 5' 9 (1.753 m)   Wt 83.9 kg   BMI 27.32 kg/m  General:   Alert,  pleasant and cooperative in NAD Head:  Normocephalic and atraumatic. Lungs:  Clear to auscultation.    Heart:  Regular rate and rhythm.   Impression/Plan: Heidi Cross is here for ophthalmic surgery.  Risks, benefits, limitations, and alternatives regarding ophthalmic surgery have been reviewed with the patient.  Questions have been answered.  All parties agreeable.   MITTIE GASKIN, MD  03/04/2024, 11:22 AM

## 2024-03-04 NOTE — Anesthesia Postprocedure Evaluation (Signed)
 Anesthesia Post Note  Patient: QUADASIA NEWSHAM  Procedure(s) Performed: PHACOEMULSIFICATION, CATARACT, WITH IOL INSERTION 14.67 01:17.4 (Left: Eye)  Patient location during evaluation: PACU Anesthesia Type: MAC Level of consciousness: awake and alert Pain management: pain level controlled Vital Signs Assessment: post-procedure vital signs reviewed and stable Respiratory status: spontaneous breathing, nonlabored ventilation, respiratory function stable and patient connected to nasal cannula oxygen Cardiovascular status: stable and blood pressure returned to baseline Postop Assessment: no apparent nausea or vomiting Anesthetic complications: no   No notable events documented.   Last Vitals:  Vitals:   03/04/24 1234 03/04/24 1239  BP: (!) 118/52 (!) 118/57  Pulse: 60 64  Resp: 11 13  Temp: (!) 36.1 C (!) 36.1 C  SpO2: 97% 97%    Last Pain:  Vitals:   03/04/24 1239  TempSrc:   PainSc: 0-No pain                 Adely Facer C Zamiah Tollett

## 2024-03-05 ENCOUNTER — Encounter: Payer: Self-pay | Admitting: Ophthalmology

## 2024-03-23 NOTE — Discharge Instructions (Signed)

## 2024-03-25 ENCOUNTER — Other Ambulatory Visit: Payer: Self-pay

## 2024-03-25 ENCOUNTER — Ambulatory Visit: Admitting: Registered Nurse

## 2024-03-25 ENCOUNTER — Ambulatory Visit
Admission: RE | Admit: 2024-03-25 | Discharge: 2024-03-25 | Disposition: A | Discharge status: Home / Self Care | Attending: Ophthalmology | Admitting: Ophthalmology

## 2024-03-25 ENCOUNTER — Encounter
Admission: RE | Disposition: A | Discharge status: Home / Self Care | Payer: Self-pay | Source: Home / Self Care | Attending: Ophthalmology

## 2024-03-25 ENCOUNTER — Encounter: Payer: Self-pay | Admitting: Ophthalmology

## 2024-03-25 DIAGNOSIS — F32A Depression, unspecified: Secondary | ICD-10-CM | POA: Diagnosis not present

## 2024-03-25 DIAGNOSIS — K219 Gastro-esophageal reflux disease without esophagitis: Secondary | ICD-10-CM | POA: Diagnosis not present

## 2024-03-25 DIAGNOSIS — H2511 Age-related nuclear cataract, right eye: Secondary | ICD-10-CM | POA: Insufficient documentation

## 2024-03-25 DIAGNOSIS — K449 Diaphragmatic hernia without obstruction or gangrene: Secondary | ICD-10-CM | POA: Diagnosis not present

## 2024-03-25 DIAGNOSIS — F419 Anxiety disorder, unspecified: Secondary | ICD-10-CM | POA: Diagnosis not present

## 2024-03-25 DIAGNOSIS — M1990 Unspecified osteoarthritis, unspecified site: Secondary | ICD-10-CM | POA: Insufficient documentation

## 2024-03-25 HISTORY — PX: CATARACT EXTRACTION W/PHACO: SHX586

## 2024-03-25 SURGERY — PHACOEMULSIFICATION, CATARACT, WITH IOL INSERTION
Anesthesia: Monitor Anesthesia Care | Site: Eye | Laterality: Right

## 2024-03-25 MED ORDER — CYCLOPENTOLATE HCL 2 % OP SOLN
OPHTHALMIC | Status: AC
  Filled 2024-03-25: qty 2

## 2024-03-25 MED ORDER — FENTANYL CITRATE (PF) 100 MCG/2ML IJ SOLN
INTRAMUSCULAR | Status: AC
  Filled 2024-03-25: qty 2

## 2024-03-25 MED ORDER — FENTANYL CITRATE (PF) 100 MCG/2ML IJ SOLN
INTRAMUSCULAR | Status: DC | PRN
  Administered 2024-03-25: 50 ug via INTRAVENOUS

## 2024-03-25 MED ORDER — PHENYLEPHRINE HCL 10 % OP SOLN
1.0000 [drp] | OPHTHALMIC | Status: AC
  Administered 2024-03-25 (×3): 1 [drp] via OPHTHALMIC

## 2024-03-25 MED ORDER — SIGHTPATH DOSE#1 BSS IO SOLN
INTRAOCULAR | Status: DC | PRN
Start: 2024-03-25 — End: 2024-03-25
  Administered 2024-03-25: 15 mL via INTRAOCULAR

## 2024-03-25 MED ORDER — MOXIFLOXACIN HCL 0.5 % OP SOLN: OPHTHALMIC | Status: DC | PRN

## 2024-03-25 MED ORDER — TETRACAINE HCL 0.5 % OP SOLN
OPHTHALMIC | Status: AC
  Filled 2024-03-25: qty 4

## 2024-03-25 MED ORDER — TETRACAINE HCL 0.5 % OP SOLN
1.0000 [drp] | OPHTHALMIC | Status: DC | PRN
  Administered 2024-03-25 (×3): 1 [drp] via OPHTHALMIC

## 2024-03-25 MED ORDER — PHENYLEPHRINE HCL 10 % OP SOLN
OPHTHALMIC | Status: AC
  Filled 2024-03-25: qty 5

## 2024-03-25 MED ORDER — SIGHTPATH DOSE#1 BSS IO SOLN
INTRAOCULAR | Status: DC | PRN
  Administered 2024-03-25: 63 mL via OPHTHALMIC

## 2024-03-25 MED ORDER — SIGHTPATH DOSE#1 NA HYALUR & NA CHOND-NA HYALUR IO KIT
PACK | INTRAOCULAR | Status: DC | PRN
Start: 2024-03-25 — End: 2024-03-25
  Administered 2024-03-25: 1 via OPHTHALMIC

## 2024-03-25 MED ORDER — CEFUROXIME OPHTHALMIC INJECTION 1 MG/0.1 ML
INJECTION | OPHTHALMIC | Status: DC | PRN
  Administered 2024-03-25: 1 mg via INTRACAMERAL

## 2024-03-25 MED ORDER — LIDOCAINE HCL (PF) 2 % IJ SOLN
INTRAOCULAR | Status: DC | PRN
  Administered 2024-03-25: 2 mL

## 2024-03-25 MED ORDER — MIDAZOLAM HCL (PF) 2 MG/2ML IJ SOLN
INTRAMUSCULAR | Status: DC | PRN
  Administered 2024-03-25: .5 mg via INTRAVENOUS

## 2024-03-25 MED ORDER — MIDAZOLAM HCL 2 MG/2ML IJ SOLN
INTRAMUSCULAR | Status: AC
  Filled 2024-03-25: qty 2

## 2024-03-25 MED ORDER — CYCLOPENTOLATE HCL 2 % OP SOLN
1.0000 [drp] | OPHTHALMIC | Status: AC
  Administered 2024-03-25 (×3): 1 [drp] via OPHTHALMIC

## 2024-03-25 MED ORDER — BRIMONIDINE TARTRATE-TIMOLOL 0.2-0.5 % OP SOLN
OPHTHALMIC | Status: DC | PRN
  Administered 2024-03-25: 1 [drp] via OPHTHALMIC

## 2024-03-25 SURGICAL SUPPLY — 8 items
FEE CATARACT SUITE SIGHTPATH (MISCELLANEOUS) ×1 IMPLANT
GLOVE BIOGEL PI IND STRL 8 (GLOVE) ×1 IMPLANT
GLOVE SURG LX STRL 7.5 STRW (GLOVE) ×1 IMPLANT
GLOVE SURG SYN 6.5 PF PI BL (GLOVE) ×1 IMPLANT
LENS IOL TECNIS EYHANCE 19.5 (Intraocular Lens) IMPLANT
NDL FILTER BLUNT 18X1 1/2 (NEEDLE) ×1 IMPLANT
NEEDLE FILTER BLUNT 18X1 1/2 (NEEDLE) ×1 IMPLANT
SYR 3ML LL SCALE MARK (SYRINGE) ×1 IMPLANT

## 2024-03-25 NOTE — Anesthesia Postprocedure Evaluation (Signed)
 Anesthesia Post Note  Patient: [name]  Procedure(s) Performed: PHACOEMULSIFICATION, CATARACT, WITH IOL  11.22 00:54.1 (Right: Eye)  Patient location during evaluation: PACU Anesthesia Type: MAC Level of consciousness: awake and alert Pain management: pain level controlled Vital Signs Assessment: post-procedure vital signs reviewed and stable Respiratory status: spontaneous breathing, nonlabored ventilation, respiratory function stable and patient connected to nasal cannula oxygen Cardiovascular status: stable and blood pressure returned to baseline Postop Assessment: no apparent nausea or vomiting Anesthetic complications: no   No notable events documented.   Last Vitals:  Vitals:   03/25/24 0818 03/25/24 0822  BP: 130/66 136/83  Pulse: (!) 59   Resp: 18   Temp: 36.6 C 36.7 C  SpO2: 96% 96%    Last Pain:  Vitals:   03/25/24 0822  TempSrc:   PainSc: 0-No pain                 Prentice Murphy

## 2024-03-25 NOTE — Anesthesia Preprocedure Evaluation (Signed)
 Anesthesia Evaluation  Patient identified by MRN, date of birth, ID band Patient awake    Reviewed: Allergy & Precautions, H&P , NPO status , Patient's Chart, lab work & pertinent test results  History of Anesthesia Complications Negative for: history of anesthetic complications  Airway Mallampati: III  TM Distance: <3 FB Neck ROM: Full    Dental no notable dental hx. (+) Partial Lower, Caps Upper central and lateral incisors caps:   Pulmonary neg shortness of breath, neg COPD, Recent URI , Residual Cough CT 11-05-23   Lungs/Pleura: No pleural effusion or pneumothorax. Biapical pleuroparenchymal thickening and scarring. Scattered ground-glass opacities. Unchanged 5 mm left upper lobe pulmonary nodule on image 29 of CT series 4.  IMPRESSION: 1. No evidence of pulmonary embolus to the segmental level. 2. Mildly enlarged heart. 3. Coronary artery atherosclerotic vascular disease. 4. Chronic pulmonary findings detailed above.    Pulmonary exam normal breath sounds clear to auscultation       Cardiovascular negative cardio ROS Normal cardiovascular exam+ dysrhythmias  Rhythm:Regular Rate:Normal  02-28-23 EKG EKG Time: 1602 Rate: 111 Rhythm: atrial fibrillation Axis: left axis deviation Intervals: qtc 397 QRS: narrow ST changes: no st elevation Impression: abnormal ekg  Has had some episodes of spontaneously resolving atrial fib, chest pain with dyspnea seen in ER that resolved.    Neuro/Psych  Headaches PSYCHIATRIC DISORDERS Anxiety Depression     Neuromuscular disease negative neurological ROS  negative psych ROS   GI/Hepatic negative GI ROS, Neg liver ROS, hiatal hernia,GERD  ,,  Endo/Other  negative endocrine ROS    Renal/GU negative Renal ROS  negative genitourinary   Musculoskeletal negative musculoskeletal ROS (+) Arthritis ,    Abdominal   Peds negative pediatric ROS (+)  Hematology negative  hematology ROS (+) Blood dyscrasia, anemia   Anesthesia Other Findings DOE after ankle fracture in June.  GERD (gastroesophageal reflux disease) Environmental allergies Insomnia  Spinal cord tumor Wears contact lenses  Syncope Shortness of breath dyspnea  Anemia Mild atherosclerosis of both carotid arteries DDD (degenerative disc disease), lumbar Depression  Hyperlipidemia Urge incontinence  Cancer (HCC) Arthritis      Reproductive/Obstetrics negative OB ROS                              Anesthesia Physical Anesthesia Plan  ASA: 3  Anesthesia Plan: MAC   Post-op Pain Management:    Induction: Intravenous  PONV Risk Score and Plan:   Airway Management Planned: Natural Airway and Nasal Cannula  Additional Equipment:   Intra-op Plan:   Post-operative Plan:   Informed Consent: I have reviewed the patients History and Physical, chart, labs and discussed the procedure including the risks, benefits and alternatives for the proposed anesthesia with the patient or authorized representative who has indicated his/her understanding and acceptance.     Dental Advisory Given  Plan Discussed with: Anesthesiologist, CRNA and Surgeon  Anesthesia Plan Comments: (Patient consented for risks of anesthesia including but not limited to:  - adverse reactions to medications - damage to eyes, teeth, lips or other oral mucosa - nerve damage due to positioning  - sore throat or hoarseness - Damage to heart, brain, nerves, lungs, other parts of body or loss of life  Patient voiced understanding and assent.)         Anesthesia Quick Evaluation

## 2024-03-25 NOTE — H&P (Signed)
 [facility]   Primary Care Physician:  Cleotilde Oneil FALCON, MD Ophthalmologist: Dr. Dene Etienne  Pre-Procedure History & Physical: HPI:  Heidi Cross is a 83 y.o. female here for ophthalmic surgery.   Past Medical History:  Diagnosis Date   Anemia     when I was young and when I was pregnant    Anxiety    Arthritis    Atrial fibrillation (HCC) 03/26/2023   spontaneous episode of atrial fibrillation.   Cancer (HCC)    Basal Cell Carcinoma face   DDD (degenerative disc disease), lumbar    Depression    Environmental allergies    GERD (gastroesophageal reflux disease)    Hiatal hernia    Hyperlipidemia    Insomnia    Migraines    Mild atherosclerosis of both carotid arteries    Neuropathy    Paroxysmal atrial fibrillation (HCC) 09/10/2023   Paroxysmal A-fib-symptomatic at times, will use metoprolol  as needed, overall pretty rare Dr. Cleotilde note   Right bundle branch block    Shortness of breath dyspnea    with exertion   Spinal cord tumor    lumbar   Syncope    Urge incontinence    Wears contact lenses     Past Surgical History:  Procedure Laterality Date   ABDOMINAL HYSTERECTOMY     CATARACT EXTRACTION W/PHACO Left 03/04/2024   Procedure: PHACOEMULSIFICATION, CATARACT, WITH IOL INSERTION 14.67 01:17.4;  Surgeon: Etienne Dene, MD;  Location: [facility];  Service: Ophthalmology;  Laterality: Left;   CHOLECYSTECTOMY     COLONOSCOPY W/ BIOPSIES AND POLYPECTOMY     x3   COLONOSCOPY WITH PROPOFOL  N/A 09/02/2017   Procedure: COLONOSCOPY WITH PROPOFOL ;  Surgeon: Viktoria Lamar DASEN, MD;  Location: [facility] ENDOSCOPY;  Service: Endoscopy;  Laterality: N/A;   DILATION AND CURETTAGE OF UTERUS     ESOPHAGOGASTRODUODENOSCOPY N/A 09/02/2017   Procedure: ESOPHAGOGASTRODUODENOSCOPY (EGD);  Surgeon: Viktoria Lamar DASEN, MD;  Location: [facility] ENDOSCOPY;  Service: Endoscopy;  Laterality: N/A;   ESOPHAGOGASTRODUODENOSCOPY N/A 12/29/2020   Procedure:  ESOPHAGOGASTRODUODENOSCOPY (EGD);  Surgeon: Onita Elspeth Sharper, DO;  Location: [facility] ENDOSCOPY;  Service: Gastroenterology;  Laterality: N/A;   LAMINECTOMY N/A 11/03/2015   Procedure: L1-2 Laminectomy for resection of intradural tumor;  Surgeon: Reyes Budge, MD;  Location: [facility] NEURO ORS;  Service: Neurosurgery;  Laterality: N/A;  L1-2 Laminectomy for resection of intradural tumor   LEFT HEART CATH AND CORONARY ANGIOGRAPHY Left 06/06/2017   Procedure: LEFT HEART CATH AND CORONARY ANGIOGRAPHY;  Surgeon: Ammon Blunt, MD;  Location: [facility] INVASIVE CV LAB;  Service: Cardiovascular;  Laterality: Left;   TONSILLECTOMY     TOTAL KNEE ARTHROPLASTY Right 08/13/2019   Procedure: RIGHT TOTAL KNEE ARTHROPLASTY;  Surgeon: Edie Norleen PARAS, MD;  Location: [facility] ORS;  Service: Orthopedics;  Laterality: Right;   XI ROBOTIC ASSISTED HIATAL HERNIA REPAIR N/A 04/04/2021   Procedure: XI ROBOTIC ASSISTED HIATAL HERNIA REPAIR WITH NISSEN FUNDOPLICATION;  Surgeon: Gladis Cough, MD;  Location: [facility] ORS;  Service: General;  Laterality: N/A;    Prior to Admission medications   Medication Sig Start Date End Date Taking? Authorizing Provider  ALPRAZolam (NIRAVAM) 0.25 MG dissolvable tablet Take 0.25 mg by mouth at bedtime as needed for anxiety.   Yes [provider]  aspirin  EC 81 MG tablet Take 81 mg by mouth daily. Swallow whole.   Yes [provider]  buPROPion  (WELLBUTRIN  SR) 150 MG 12 hr tablet Take 150 mg by mouth 2 (two) times daily.   Yes  [provider]  Calcium  Carbonate-Vitamin D  500-125 MG-UNIT TABS Take 1 tablet by mouth every other day.   Yes [provider]  Estriol Micronized POWD 1 application  by Does not apply route daily.   Yes [provider]  etodolac (LODINE XL) 400 MG 24 hr tablet Take 400 mg by mouth daily.   Yes [provider]  famotidine  (PEPCID ) 40 MG tablet Take 40 mg by mouth daily.   Yes [provider]  fluticasone   (FLONASE ) 50 MCG/ACT nasal spray Place 1 spray into both nostrils daily as needed for allergies or rhinitis.   Yes [provider]  gabapentin  (NEURONTIN ) 300 MG capsule Take 300-600 mg by mouth. 05/27/18  Yes [provider]  Glucosamine-Chondroitin 500-400 MG CAPS Take 1 tablet by mouth daily.  04/15/08  Yes [provider]  losartan (COZAAR) 25 MG tablet Take 25 mg by mouth daily.   Yes [provider]  metoprolol  tartrate (LOPRESSOR ) 25 MG tablet Take 25 mg by mouth daily.   Yes [provider]  Multiple Vitamins-Minerals (QC WOMENS DAILY MULTIVITAMIN) TABS Take 1 tablet by mouth daily.  04/15/08  Yes [provider]  pantoprazole  (PROTONIX ) 40 MG tablet Take 1 tablet (40 mg total) by mouth daily. 04/05/21  Yes Gladis Cough, MD  traZODone  (DESYREL ) 50 MG tablet Take 50 mg by mouth at bedtime.   Yes [provider]  venlafaxine XR (EFFEXOR-XR) 37.5 MG 24 hr capsule Take 37.5 mg by mouth daily with breakfast.   Yes [provider]  cyclobenzaprine  (FLEXERIL ) 10 MG tablet Take 10 mg by mouth 2 (two) times daily as needed for muscle spasms. 07/07/18   [provider]  Polyethyl Glycol-Propyl Glycol (SYSTANE ULTRA) 0.4-0.3 % SOLN Apply 1 drop to eye as needed.    [provider]  PRESCRIPTION MEDICATION Place 1 application vaginally 2 (two) times a week. Compounded Med - Estriol 1 mg    [provider]    Allergies as of 01/29/2024   (No Known Allergies)    Family History  Problem Relation Age of Onset   Diabetes Mother    Cancer Mother    Hypertension Mother    Cancer Father    Breast cancer Neg Hx     Social History   Socioeconomic History   Marital status: Married    Spouse name: Not on file   Number of children: Not on file   Years of education: Not on file   Highest education level: Not on file  Occupational History   Not on file  Tobacco Use   Smoking status: Never    Smokeless tobacco: Never  Vaping Use   Vaping status: Never Used  Substance and Sexual Activity   Alcohol use: Yes    Comment: occasional wine  1 week   Drug use: No   Sexual activity: Not on file  Other Topics Concern   Not on file  Social History Narrative   Not on file   Social Drivers of Health   Financial Resource Strain: Low Risk  (09/24/2023)   Received from [facility]   Overall Financial Resource Strain (CARDIA)    Difficulty of Paying Living Expenses: Not very hard  Food Insecurity: No Food Insecurity (09/24/2023)   Received from [facility]   Hunger Vital Sign    Within the past 12 months, you worried that your food would run out before you got the money to buy more.: Never true  Within the past 12 months, the food you bought just didn't last and you didn't have money to get more.: Never true  Transportation Needs: No Transportation Needs (09/24/2023)   Received from [facility] Transportation    In the past 12 months, has lack of transportation kept you from medical appointments or from getting medications?: No    Lack of Transportation (Non-Medical): No  Physical Activity: Not on file  Stress: Not on file  Social Connections: Not on file  Intimate Partner Violence: Not on file    Review of Systems: See HPI, otherwise negative ROS  Physical Exam: BP (!) 144/77   Pulse 66   Temp (!) 96.8 F (36 C) (Temporal)   Wt 83 kg   SpO2 95%   BMI 27.01 kg/m  General:   Alert,  pleasant and cooperative in NAD Head:  Normocephalic and atraumatic. Lungs:  Clear to auscultation.    Heart:  Regular rate and rhythm.   Impression/Plan: Heidi Cross is here for ophthalmic surgery.  Risks, benefits, limitations, and alternatives regarding ophthalmic surgery have been reviewed with the patient.  Questions have been answered.  All parties agreeable.   MITTIE GASKIN, MD  03/25/2024, 7:31 AM

## 2024-03-25 NOTE — Op Note (Signed)
 LOCATION:  [facility]   PREOPERATIVE DIAGNOSIS:    Nuclear sclerotic cataract right eye. H25.11   POSTOPERATIVE DIAGNOSIS:  Nuclear sclerotic cataract right eye.     PROCEDURE:  Phacoemusification with posterior chamber intraocular lens placement of the right eye   ULTRASOUND TIME: Procedure(s): PHACOEMULSIFICATION, CATARACT, WITH IOL  11.22 00:54.1 (Right)  LENS:   Implant Name Type Inv. Item Serial No. Manufacturer Lot No. LRB No. Used Action  LENS IOL TECNIS EYHANCE 19.5 - [id number] Intraocular Lens LENS IOL TECNIS EYHANCE 19.5 [id number] [employer]  Right 1 Implanted         SURGEON:  Dene FABIENE Etienne, MD   ANESTHESIA:  Topical with tetracaine drops and 2% Xylocaine  jelly, augmented with 1% preservative-free intracameral lidocaine .    COMPLICATIONS:  None.   DESCRIPTION OF PROCEDURE:  The patient was identified in the holding room and transported to the operating room and placed in the supine position under the operating microscope.  The right eye was identified as the operative eye and it was prepped and draped in the usual sterile ophthalmic fashion.   A 1 millimeter clear-corneal paracentesis was made at the 12:00 position.  0.5 ml of preservative-free 1% lidocaine  was injected into the anterior chamber. The anterior chamber was filled with Viscoat viscoelastic.  A 2.4 millimeter keratome was used to make a near-clear corneal incision at the 9:00 position.  A curvilinear capsulorrhexis was made with a cystotome and capsulorrhexis forceps.  Balanced salt solution was used to hydrodissect and hydrodelineate the nucleus.   Phacoemulsification was then used in stop and chop fashion to remove the lens nucleus and epinucleus.  The remaining cortex was then removed using the irrigation and aspiration handpiece. Provisc was then placed into the capsular bag to distend it for lens placement.  A lens was then injected into the capsular bag.  The remaining viscoelastic was  aspirated.   Wounds were hydrated with balanced salt solution.  The anterior chamber was inflated to a physiologic pressure with balanced salt solution.  No wound leaks were noted. Cefuroxime 0.1 ml of a 10mg /ml solution was injected into the anterior chamber for a dose of 1 mg of intracameral antibiotic at the completion of the case.   Timolol and Brimonidine drops were applied to the eye.  The patient was taken to the recovery room in stable condition without complications of anesthesia or surgery.   [name] 03/25/2024, 8:16 AM

## 2024-03-25 NOTE — Transfer of Care (Signed)
 Immediate Anesthesia Transfer of Care Note  Patient: Heidi Cross  Procedure(s) Performed: PHACOEMULSIFICATION, CATARACT, WITH IOL  11.22 00:54.1 (Right: Eye)  Patient Location: PACU  Anesthesia Type: MAC  Level of Consciousness: awake, alert  and patient cooperative  Airway and Oxygen Therapy: Patient Spontanous Breathing and Patient connected to supplemental oxygen  Post-op Assessment: Post-op Vital signs reviewed, Patient's Cardiovascular Status Stable, Respiratory Function Stable, Patent Airway and No signs of Nausea or vomiting  Post-op Vital Signs: Reviewed and stable  Complications: No notable events documented.

## 2024-04-30 ENCOUNTER — Encounter: Payer: Self-pay | Admitting: Internal Medicine

## 2024-04-30 ENCOUNTER — Other Ambulatory Visit: Payer: Self-pay | Admitting: Physical Medicine and Rehabilitation

## 2024-04-30 ENCOUNTER — Inpatient Hospital Stay

## 2024-04-30 ENCOUNTER — Inpatient Hospital Stay: Admitting: Internal Medicine

## 2024-04-30 VITALS — BP 120/63 | HR 61 | Temp 98.3°F | Resp 16 | Ht 69.02 in | Wt 183.3 lb

## 2024-04-30 DIAGNOSIS — Z79899 Other long term (current) drug therapy: Secondary | ICD-10-CM | POA: Diagnosis not present

## 2024-04-30 DIAGNOSIS — I480 Paroxysmal atrial fibrillation: Secondary | ICD-10-CM | POA: Diagnosis not present

## 2024-04-30 DIAGNOSIS — N182 Chronic kidney disease, stage 2 (mild): Secondary | ICD-10-CM | POA: Insufficient documentation

## 2024-04-30 DIAGNOSIS — D649 Anemia, unspecified: Secondary | ICD-10-CM

## 2024-04-30 DIAGNOSIS — D631 Anemia in chronic kidney disease: Secondary | ICD-10-CM

## 2024-04-30 DIAGNOSIS — M5416 Radiculopathy, lumbar region: Secondary | ICD-10-CM

## 2024-04-30 LAB — CBC WITH DIFFERENTIAL/PLATELET
Abs Immature Granulocytes: 0.08 K/uL — ABNORMAL HIGH (ref 0.00–0.07)
Basophils Absolute: 0.1 K/uL (ref 0.0–0.1)
Basophils Relative: 1 %
Eosinophils Absolute: 0.2 K/uL (ref 0.0–0.5)
Eosinophils Relative: 3 %
HCT: 29.3 % — ABNORMAL LOW (ref 36.0–46.0)
Hemoglobin: 9.5 g/dL — ABNORMAL LOW (ref 12.0–15.0)
Immature Granulocytes: 1 %
Lymphocytes Relative: 23 %
Lymphs Abs: 1.4 K/uL (ref 0.7–4.0)
MCH: 30.5 pg (ref 26.0–34.0)
MCHC: 32.4 g/dL (ref 30.0–36.0)
MCV: 94.2 fL (ref 80.0–100.0)
Monocytes Absolute: 0.6 K/uL (ref 0.1–1.0)
Monocytes Relative: 10 %
Neutro Abs: 3.7 K/uL (ref 1.7–7.7)
Neutrophils Relative %: 62 %
Platelets: 361 K/uL (ref 150–400)
RBC: 3.11 MIL/uL — ABNORMAL LOW (ref 3.87–5.11)
RDW: 12.5 % (ref 11.5–15.5)
WBC: 6 K/uL (ref 4.0–10.5)
nRBC: 0 % (ref 0.0–0.2)

## 2024-04-30 LAB — COMPREHENSIVE METABOLIC PANEL WITH GFR
ALT: 11 U/L (ref 0–44)
AST: 16 U/L (ref 15–41)
Albumin: 4 g/dL (ref 3.5–5.0)
Alkaline Phosphatase: 89 U/L (ref 38–126)
Anion gap: 12 (ref 5–15)
BUN: 15 mg/dL (ref 8–23)
CO2: 25 mmol/L (ref 22–32)
Calcium: 9.6 mg/dL (ref 8.9–10.3)
Chloride: 100 mmol/L (ref 98–111)
Creatinine, Ser: 1.27 mg/dL — ABNORMAL HIGH (ref 0.44–1.00)
GFR, Estimated: 42 mL/min — ABNORMAL LOW (ref >60)
Glucose, Bld: 88 mg/dL (ref 70–99)
Potassium: 4.2 mmol/L (ref 3.5–5.1)
Sodium: 136 mmol/L (ref 135–145)
Total Bilirubin: 0.3 mg/dL (ref 0.0–1.2)
Total Protein: 6.8 g/dL (ref 6.5–8.1)

## 2024-04-30 LAB — IRON AND TIBC
Iron: 74 ug/dL (ref 28–170)
Saturation Ratios: 23 % (ref 10.4–31.8)
TIBC: 325 ug/dL (ref 250–450)
UIBC: 251 ug/dL

## 2024-04-30 LAB — RETICULOCYTES
Immature Retic Fract: 11.8 % (ref 2.3–15.9)
RBC.: 3.05 MIL/uL — ABNORMAL LOW (ref 3.87–5.11)
Retic Count, Absolute: 56.7 K/uL (ref 19.0–186.0)
Retic Ct Pct: 1.9 % (ref 0.4–3.1)

## 2024-04-30 LAB — VITAMIN B12: Vitamin B-12: 452 pg/mL (ref 180–914)

## 2024-04-30 LAB — FOLATE: Folate: >20 ng/mL (ref >5.9)

## 2024-04-30 LAB — FERRITIN: Ferritin: 97 ng/mL (ref 11–307)

## 2024-04-30 LAB — LACTATE DEHYDROGENASE: LDH: 196 U/L (ref 105–235)

## 2024-04-30 NOTE — Progress Notes (Unsigned)
 [facility] CONSULT NOTE  Patient Care Team: Cleotilde Oneil FALCON, MD as PCP - General (Internal Medicine) Rennie Heidi SAUNDERS, MD as Consulting Physician (Oncology)  CHIEF COMPLAINTS/PURPOSE OF CONSULTATION: ANEMIA   HEMATOLOGY HISTORY  # ANEMIA[Hb; MCV-platelets- WBC; Iron sat; ferritin;  GFR- CT/US ; EGD/colonoscopy-  HISTORY OF PRESENTING ILLNESS:  Heidi Cross 83 y.o.  female with  Past Medical History:  Diagnosis Date   Anemia     when I was young and when I was pregnant    Anxiety    Arthritis    Atrial fibrillation (HCC) 03/26/2023   spontaneous episode of atrial fibrillation.   Cancer (HCC)    Basal Cell Carcinoma face   DDD (degenerative disc disease), lumbar    Depression    Environmental allergies    GERD (gastroesophageal reflux disease)    Hiatal hernia    Hyperlipidemia    Insomnia    Migraines    Mild atherosclerosis of both carotid arteries    Neuropathy    Paroxysmal atrial fibrillation (HCC) 09/10/2023   Paroxysmal A-fib-symptomatic at times, will use metoprolol  as needed, overall pretty rare Dr. Cleotilde note   Right bundle branch block    Shortness of breath dyspnea    with exertion   Spinal cord tumor    lumbar   Syncope    Urge incontinence    Wears contact lenses    pleasant patient was been referred to us  for further evaluation of anemia.     Blood in stools: none; colonoscopy- ~ 2020.  Blood in urine:none; Difficulty swallowing:none; Change of bowel movement/constipation:none; Prior blood transfusion:none;  Liver disease: none; Alcohol: none; Bariatric surgery:none;   Vaginal bleeding: none; Prior evaluation with hematology:none; Prior bone marrow biopsy: none; Oral iron:3 months- mild constipation Prior IV iron infusions:none;   # I had a long discussion with the patient regarding etiology of unexplained anemia/thrombocytopenia.  The etiologies include -benign like nutritional/malabsorption, liver disease,  chronic kidney disease viral infections, autoimmune; blood loss etc.  Low clinical concern for any malignant causes.  I recommend CBC CMP LDH haptoglobin B12 folic acid reticulocyte count platelet immature fraction; review of peripheral smear.  Iron studies; ferritin.myeloma panel kappa lambda light chain ratio.    If no obvious cause noted would recommend further work-up including a bone marrow biopsy/ultrasound/CT scans.  For now hold off bone marrow biopsy/imaging at this time.  -  Would recommend blood transfusion if hemoglobin 7-8 /symptomatic.  Discussed the reasoning for PRBC transfusion; and also also the potential risk of transfusion including but not limited to risk of infusion reactions; transmission of infections amongst others.  However the risk is extremely small; and the benefits of the infusion overweighs the risk.  Patient agreement to proceed with transfusion if needed.  Check ABO/Rh; hold tube.  ----------------------------------------   #Iron deficient anemia: Discussed the potential acute infusion reactions with IV iron; which are quite rare.  Patient understands the risk; will proceed with infusions.    I had a long discussion with the patient regarding various forms of dietary iron as it is present universally in meat.  Also discussed regarding plant sources-including but not limited to leafy vegetables [spinach], broccoli. Recommend gentle iron 1 pill a day; should not upset your stomach or cause constipation.  Talk to the pharmacist if you can find it/it is over-the-counter. Patient is intolerant to oral iron/or iron levels do not improve on oral supplementation-recommend IV iron infusions.   #Etiology of iron deficiency: Unclear; GI evaluation-EGD  colonoscopy; capsule study; CT scan abdomen pelvis.    --------------------------------------     ----------------------------------------------------------------------   # 45 minutes face-to-face with the patient discussing  the above plan of care; more than 50% of time spent on counseling and coordination. My contact information was given; and all questions were answered. The patient knows to call the clinic with any problems, questions or concerns.    Review of Systems  Constitutional:  Positive for malaise/fatigue. Negative for chills, diaphoresis, fever and weight loss.  HENT:  Negative for nosebleeds and sore throat.   Eyes:  Negative for double vision.  Respiratory:  Negative for cough, hemoptysis, sputum production, shortness of breath and wheezing.   Cardiovascular:  Negative for chest pain, palpitations, orthopnea and leg swelling.  Gastrointestinal:  Negative for abdominal pain, blood in stool, constipation, diarrhea, heartburn, melena, nausea and vomiting.  Genitourinary:  Negative for dysuria, frequency and urgency.  Musculoskeletal:  Positive for back pain and joint pain.  Skin: Negative.  Negative for itching and rash.  Neurological:  Negative for dizziness, tingling, focal weakness, weakness and headaches.  Endo/Heme/Allergies:  Does not bruise/bleed easily.  Psychiatric/Behavioral:  Negative for depression. The patient is not nervous/anxious and does not have insomnia.      MEDICAL HISTORY:  Past Medical History:  Diagnosis Date   Anemia     when I was young and when I was pregnant    Anxiety    Arthritis    Atrial fibrillation (HCC) 03/26/2023   spontaneous episode of atrial fibrillation.   Cancer (HCC)    Basal Cell Carcinoma face   DDD (degenerative disc disease), lumbar    Depression    Environmental allergies    GERD (gastroesophageal reflux disease)    Hiatal hernia    Hyperlipidemia    Insomnia    Migraines    Mild atherosclerosis of both carotid arteries    Neuropathy    Paroxysmal atrial fibrillation (HCC) 09/10/2023   Paroxysmal A-fib-symptomatic at times, will use metoprolol  as needed, overall pretty rare Dr. Cleotilde note   Right bundle branch block    Shortness of  breath dyspnea    with exertion   Spinal cord tumor    lumbar   Syncope    Urge incontinence    Wears contact lenses     SURGICAL HISTORY: Past Surgical History:  Procedure Laterality Date   ABDOMINAL HYSTERECTOMY     CATARACT EXTRACTION W/PHACO Left 03/04/2024   Procedure: PHACOEMULSIFICATION, CATARACT, WITH IOL INSERTION 14.67 01:17.4;  Surgeon: Mittie Gaskin, MD;  Location: [facility];  Service: Ophthalmology;  Laterality: Left;   CATARACT EXTRACTION W/PHACO Right 03/25/2024   Procedure: PHACOEMULSIFICATION, CATARACT, WITH IOL  11.22 00:54.1;  Surgeon: Mittie Gaskin, MD;  Location: [facility];  Service: Ophthalmology;  Laterality: Right;   CHOLECYSTECTOMY     COLONOSCOPY W/ BIOPSIES AND POLYPECTOMY     x3   COLONOSCOPY WITH PROPOFOL  N/A 09/02/2017   Procedure: COLONOSCOPY WITH PROPOFOL ;  Surgeon: Viktoria Lamar DASEN, MD;  Location: [facility] ENDOSCOPY;  Service: Endoscopy;  Laterality: N/A;   DILATION AND CURETTAGE OF UTERUS     ESOPHAGOGASTRODUODENOSCOPY N/A 09/02/2017   Procedure: ESOPHAGOGASTRODUODENOSCOPY (EGD);  Surgeon: Viktoria Lamar DASEN, MD;  Location: [facility] ENDOSCOPY;  Service: Endoscopy;  Laterality: N/A;   ESOPHAGOGASTRODUODENOSCOPY N/A 12/29/2020   Procedure: ESOPHAGOGASTRODUODENOSCOPY (EGD);  Surgeon: Onita Elspeth Sharper, DO;  Location: [facility] ENDOSCOPY;  Service: Gastroenterology;  Laterality: N/A;   LAMINECTOMY N/A 11/03/2015   Procedure: L1-2 Laminectomy for resection of intradural tumor;  Surgeon: Reyes Budge, MD;  Location: [facility] NEURO ORS;  Service: Neurosurgery;  Laterality: N/A;  L1-2 Laminectomy for resection of intradural tumor   LEFT HEART CATH AND CORONARY ANGIOGRAPHY Left 06/06/2017   Procedure: LEFT HEART CATH AND CORONARY ANGIOGRAPHY;  Surgeon: Ammon Blunt, MD;  Location: [facility] INVASIVE CV LAB;  Service: Cardiovascular;  Laterality: Left;   TONSILLECTOMY     TOTAL KNEE ARTHROPLASTY Right 08/13/2019   Procedure: RIGHT TOTAL KNEE  ARTHROPLASTY;  Surgeon: Edie Norleen PARAS, MD;  Location: [facility] ORS;  Service: Orthopedics;  Laterality: Right;   XI ROBOTIC ASSISTED HIATAL HERNIA REPAIR N/A 04/04/2021   Procedure: XI ROBOTIC ASSISTED HIATAL HERNIA REPAIR WITH NISSEN FUNDOPLICATION;  Surgeon: Gladis Cough, MD;  Location: [facility] ORS;  Service: General;  Laterality: N/A;    SOCIAL HISTORY: Social History   Socioeconomic History   Marital status: Widowed    Spouse name: Not on file   Number of children: Not on file   Years of education: Not on file   Highest education level: Not on file  Occupational History   Not on file  Tobacco Use   Smoking status: Never   Smokeless tobacco: Never  Vaping Use   Vaping status: Never Used  Substance and Sexual Activity   Alcohol use: Yes    Comment: occasional wine  1 week   Drug use: No   Sexual activity: Not on file  Other Topics Concern   Not on file  Social History Narrative   Not on file   Social Drivers of Health   Tobacco Use: Low Risk (04/30/2024)   Patient History    Smoking Tobacco Use: Never    Smokeless Tobacco Use: Never    Passive Exposure: Not on file  Financial Resource Strain: Low Risk  (09/24/2023)   Received from [facility]   Overall Financial Resource Strain (CARDIA)    Difficulty of Paying Living Expenses: Not very hard  Food Insecurity: No Food Insecurity (04/30/2024)   Epic    Worried About Running Out of Food in the Last Year: Never true    Ran Out of Food in the Last Year: Never true  Transportation Needs: No Transportation Needs (04/30/2024)   Epic    Lack of Transportation (Medical): No    Lack of Transportation (Non-Medical): No  Physical Activity: Not on file  Stress: Not on file  Social Connections: Not on file  Intimate Partner Violence: Not At Risk (04/30/2024)   Epic    Fear of Current or Ex-Partner: No    Emotionally Abused: No    Physically Abused: No    Sexually Abused: No  Depression (PHQ2-9): Low Risk  (04/30/2024)   Depression (PHQ2-9)    PHQ-2 Score: 0  Alcohol Screen: Not on file  Housing: Low Risk (04/30/2024)   Epic    Unable to Pay for Housing in the Last Year: No    Number of Times Moved in the Last Year: 0    Homeless in the Last Year: No  Utilities: Not At Risk (04/30/2024)   Epic    Threatened with loss of utilities: No  Health Literacy: Not on file    FAMILY HISTORY: Family History  Problem Relation Age of Onset   Diabetes Mother    Cancer Mother    Hypertension Mother    Cancer Father    Breast cancer Neg Hx     ALLERGIES:  has no known allergies.  MEDICATIONS:  Current Outpatient Medications  Medication Sig Dispense Refill  ALPRAZolam (NIRAVAM) 0.25 MG dissolvable tablet Take 0.25 mg by mouth at bedtime as needed for anxiety.     aspirin  EC 81 MG tablet Take 81 mg by mouth daily. Swallow whole.     Calcium  Carbonate-Vitamin D  500-125 MG-UNIT TABS Take 1 tablet by mouth every other day.     Estriol Micronized POWD 1 application  by Does not apply route daily.     famotidine  (PEPCID ) 40 MG tablet Take 40 mg by mouth daily.     gabapentin  (NEURONTIN ) 300 MG capsule Take 300-600 mg by mouth.     Glucosamine-Chondroitin 500-400 MG CAPS Take 1 tablet by mouth daily.      metoprolol  tartrate (LOPRESSOR ) 25 MG tablet Take 25 mg by mouth daily.     Multiple Vitamins-Minerals (QC WOMENS DAILY MULTIVITAMIN) TABS Take 1 tablet by mouth daily.      pantoprazole  (PROTONIX ) 40 MG tablet Take 1 tablet (40 mg total) by mouth daily. 30 tablet 0   Polyethyl Glycol-Propyl Glycol (SYSTANE ULTRA) 0.4-0.3 % SOLN Apply 1 drop to eye as needed.     PRESCRIPTION MEDICATION Place 1 application vaginally 2 (two) times a week. Compounded Med - Estriol 1 mg     venlafaxine XR (EFFEXOR-XR) 37.5 MG 24 hr capsule Take 37.5 mg by mouth daily with breakfast.     No current facility-administered medications for this visit.     PHYSICAL EXAMINATION:   Vitals:   04/30/24 1112  BP:  120/63  Pulse: 61  Resp: 16  Temp: 98.3 F (36.8 C)  SpO2: 99%   Filed Weights   04/30/24 1112  Weight: 183 lb 4.8 oz (83.1 kg)    Physical Exam Vitals and nursing note reviewed.  HENT:     Head: Normocephalic and atraumatic.     Mouth/Throat:     Pharynx: Oropharynx is clear.  Eyes:     Extraocular Movements: Extraocular movements intact.     Pupils: Pupils are equal, round, and reactive to light.  Cardiovascular:     Rate and Rhythm: Normal rate and regular rhythm.  Pulmonary:     Comments: Decreased breath sounds bilaterally.  Abdominal:     Palpations: Abdomen is soft.  Musculoskeletal:        General: Normal range of motion.     Cervical back: Normal range of motion.  Skin:    General: Skin is warm.  Neurological:     General: No focal deficit present.     Mental Status: She is alert and oriented to person, place, and time.  Psychiatric:        Behavior: Behavior normal.        Judgment: Judgment normal.      LABORATORY DATA:  I have reviewed the data as listed Lab Results  Component Value Date   WBC 6.5 02/28/2023   HGB 11.9 (L) 02/28/2023   HCT 36.8 02/28/2023   MCV 97.9 02/28/2023   PLT 265 02/28/2023   Recent Labs    11/05/23 1349  CREATININE 1.30*     No results found.  ASSESSMENT & PLAN:   No problem-specific Assessment & Plan notes found for this encounter.    Heidi JONELLE Joe, MD 04/30/2024 11:35 AM

## 2024-04-30 NOTE — Assessment & Plan Note (Signed)
#   Moderate anemia hemoglobin 9.7; platelets -N  [DEC 2025- PCP]; creatinine 1.2; GFR 40s calcium  normal; Ferritin-61. Heidi Cross  However elevated globulin.  Highly concerning for plasma cells/multiple myeloma.   However I had a long discussion with the patient regarding etiology of unexplained anemia/thrombocytopenia.  Discussed that the etiologies include -benign like nutritional/malabsorption, liver disease, chronic kidney disease viral infections, autoimmune; blood loss etc. and discussed concern for malignancy.  I recommend CBC CMP LDH haptoglobin B12 folic acid reticulocyte count platelet immature fraction; review of peripheral smear.  Iron studies; ferritin.myeloma panel kappa lambda light chain ratio.   # I discussed the need for bone marrow biopsy for further evaluation.  This will need to be ordered through interventional radiology.  Also discussed regarding skeletal survey.  I think is reasonable to order this test-  at next visit after availability of kappa lambda light chain ratio and the M protein.    Thank you Dr. Cleotilde for allowing me to participate in the care of your pleasant patient. Please do not hesitate to contact me with questions or concerns in the interim.  # DISPOSITION: # labs today-  # follow up in appx 1weeks-- MD; NO labs-possible venofer- Dr.B

## 2024-04-30 NOTE — Progress Notes (Unsigned)
 Fatigue/weakness: YES Dyspena: NO  Light headedness: YES Blood in stool: NO

## 2024-05-01 ENCOUNTER — Encounter: Payer: Self-pay | Admitting: Internal Medicine

## 2024-05-01 LAB — HAPTOGLOBIN: Haptoglobin: 352 mg/dL — ABNORMAL HIGH (ref 41–333)

## 2024-05-01 LAB — KAPPA/LAMBDA LIGHT CHAINS
Kappa free light chain: 20.5 mg/L — ABNORMAL HIGH (ref 3.3–19.4)
Kappa, lambda light chain ratio: 1.31 (ref 0.26–1.65)
Lambda free light chains: 15.7 mg/L (ref 5.7–26.3)

## 2024-05-01 LAB — ERYTHROPOIETIN: Erythropoietin: 19.6 m[IU]/mL — ABNORMAL HIGH (ref 2.6–18.5)

## 2024-05-02 LAB — MULTIPLE MYELOMA PANEL, SERUM
Albumin SerPl Elph-Mcnc: 3.3 g/dL (ref 2.9–4.4)
Albumin/Glob SerPl: 1.2 (ref 0.7–1.7)
Alpha 1: 0.3 g/dL (ref 0.0–0.4)
Alpha2 Glob SerPl Elph-Mcnc: 1 g/dL (ref 0.4–1.0)
B-Globulin SerPl Elph-Mcnc: 1 g/dL (ref 0.7–1.3)
Gamma Glob SerPl Elph-Mcnc: 0.7 g/dL (ref 0.4–1.8)
Globulin, Total: 3 g/dL (ref 2.2–3.9)
IgA: 78 mg/dL (ref 64–422)
IgG (Immunoglobin G), Serum: 685 mg/dL (ref 586–1602)
IgM (Immunoglobulin M), Srm: 94 mg/dL (ref 26–217)
Total Protein ELP: 6.3 g/dL (ref 6.0–8.5)

## 2024-05-04 ENCOUNTER — Telehealth: Payer: Self-pay | Admitting: Internal Medicine

## 2024-05-04 NOTE — Telephone Encounter (Signed)
 Pt called to see if we had any openings prior to her next appt - checked w/infusion schedule and her appt is the first available - let the pt know and confirmed appt date/time - LH

## 2024-05-07 ENCOUNTER — Telehealth: Payer: Self-pay | Admitting: Internal Medicine

## 2024-05-07 ENCOUNTER — Inpatient Hospital Stay: Admitting: Nurse Practitioner

## 2024-05-07 ENCOUNTER — Inpatient Hospital Stay

## 2024-05-07 NOTE — Telephone Encounter (Signed)
 Pt called to cancel appt for today - said she was nauseated and getting sick all night - pt will call when she is ready to r/s - [facility]

## 2024-05-15 ENCOUNTER — Encounter: Payer: Self-pay | Admitting: Internal Medicine

## 2024-05-15 ENCOUNTER — Ambulatory Visit
Admission: RE | Admit: 2024-05-15 | Discharge: 2024-05-15 | Disposition: A | Discharge status: Home / Self Care | Source: Ambulatory Visit | Attending: Physical Medicine and Rehabilitation | Admitting: Physical Medicine and Rehabilitation

## 2024-05-15 DIAGNOSIS — M5416 Radiculopathy, lumbar region: Secondary | ICD-10-CM

## 2024-05-25 DIAGNOSIS — D649 Anemia, unspecified: Secondary | ICD-10-CM

## 2024-05-28 ENCOUNTER — Inpatient Hospital Stay

## 2024-05-28 ENCOUNTER — Encounter: Payer: Self-pay | Admitting: Nurse Practitioner

## 2024-05-28 ENCOUNTER — Inpatient Hospital Stay: Attending: Internal Medicine | Admitting: Nurse Practitioner

## 2024-05-28 VITALS — BP 120/55 | HR 60 | Temp 98.5°F | Ht 69.0 in | Wt 173.0 lb

## 2024-05-28 DIAGNOSIS — N189 Chronic kidney disease, unspecified: Secondary | ICD-10-CM | POA: Insufficient documentation

## 2024-05-28 DIAGNOSIS — D649 Anemia, unspecified: Secondary | ICD-10-CM

## 2024-05-28 DIAGNOSIS — D631 Anemia in chronic kidney disease: Secondary | ICD-10-CM | POA: Diagnosis not present

## 2024-05-28 DIAGNOSIS — N182 Chronic kidney disease, stage 2 (mild): Secondary | ICD-10-CM | POA: Diagnosis present

## 2024-05-28 DIAGNOSIS — N1832 Chronic kidney disease, stage 3b: Secondary | ICD-10-CM

## 2024-05-28 LAB — COMPREHENSIVE METABOLIC PANEL WITH GFR
ALT: 13 U/L (ref 0–44)
AST: 17 U/L (ref 15–41)
Albumin: 4.3 g/dL (ref 3.5–5.0)
Alkaline Phosphatase: 76 U/L (ref 38–126)
Anion gap: 13 (ref 5–15)
BUN: 17 mg/dL (ref 8–23)
CO2: 24 mmol/L (ref 22–32)
Calcium: 10.4 mg/dL — ABNORMAL HIGH (ref 8.9–10.3)
Chloride: 101 mmol/L (ref 98–111)
Creatinine, Ser: 1.39 mg/dL — ABNORMAL HIGH (ref 0.44–1.00)
GFR, Estimated: 38 mL/min — ABNORMAL LOW
Glucose, Bld: 131 mg/dL — ABNORMAL HIGH (ref 70–99)
Potassium: 3.9 mmol/L (ref 3.5–5.1)
Sodium: 138 mmol/L (ref 135–145)
Total Bilirubin: 0.4 mg/dL (ref 0.0–1.2)
Total Protein: 6.7 g/dL (ref 6.5–8.1)

## 2024-05-28 LAB — VITAMIN B12: Vitamin B-12: 600 pg/mL (ref 180–914)

## 2024-05-28 LAB — CBC WITH DIFFERENTIAL/PLATELET
Abs Immature Granulocytes: 0.02 K/uL (ref 0.00–0.07)
Basophils Absolute: 0 K/uL (ref 0.0–0.1)
Basophils Relative: 1 %
Eosinophils Absolute: 0.1 K/uL (ref 0.0–0.5)
Eosinophils Relative: 2 %
HCT: 32.9 % — ABNORMAL LOW (ref 36.0–46.0)
Hemoglobin: 10.7 g/dL — ABNORMAL LOW (ref 12.0–15.0)
Immature Granulocytes: 0 %
Lymphocytes Relative: 36 %
Lymphs Abs: 2.2 K/uL (ref 0.7–4.0)
MCH: 30.9 pg (ref 26.0–34.0)
MCHC: 32.5 g/dL (ref 30.0–36.0)
MCV: 95.1 fL (ref 80.0–100.0)
Monocytes Absolute: 0.5 K/uL (ref 0.1–1.0)
Monocytes Relative: 8 %
Neutro Abs: 3.3 K/uL (ref 1.7–7.7)
Neutrophils Relative %: 53 %
Platelets: 264 K/uL (ref 150–400)
RBC: 3.46 MIL/uL — ABNORMAL LOW (ref 3.87–5.11)
RDW: 12.8 % (ref 11.5–15.5)
WBC: 6.2 K/uL (ref 4.0–10.5)
nRBC: 0 % (ref 0.0–0.2)

## 2024-05-28 LAB — IRON AND TIBC
Iron: 65 ug/dL (ref 28–170)
Saturation Ratios: 16 % (ref 10.4–31.8)
TIBC: 416 ug/dL (ref 250–450)
UIBC: 351 ug/dL

## 2024-05-28 LAB — FERRITIN: Ferritin: 42 ng/mL (ref 11–307)

## 2024-05-28 NOTE — Progress Notes (Signed)
 [facility] CONSULT NOTE  Patient Care Team: Cleotilde Oneil FALCON, MD as PCP - General (Internal Medicine) Rennie Cindy SAUNDERS, MD as Consulting Physician (Oncology)  CHIEF COMPLAINTS/PURPOSE OF CONSULTATION: ANEMIA  HEMATOLOGY HISTORY  # ANEMIA [Hb; MCV-platelets- WBC; Iron sat; ferritin;  GFR- CT/US ; EGD/colonoscopy]  Blood in stools: none; colonoscopy- ~ 2020.  Blood in urine:none; Difficulty swallowing:none; Change of bowel movement/constipation:none; Prior blood transfusion:none;  Liver disease: none; Alcohol: none; Bariatric surgery:none;  Vaginal bleeding: none; Prior evaluation with hematology:none; Prior bone marrow biopsy: none; Oral iron:3 months- mild constipation Prior IV iron infusions:none;  HISTORY OF PRESENTING ILLNESS:  Heidi Cross 84 y.o. female with above history of anemia who returns to clinic for follow up. She feels well and denies complaints.    Review of Systems  Constitutional:  Positive for malaise/fatigue. Negative for chills, diaphoresis, fever and weight loss.  HENT:  Negative for nosebleeds and sore throat.   Eyes:  Negative for double vision.  Respiratory:  Negative for cough, hemoptysis, sputum production, shortness of breath and wheezing.   Cardiovascular:  Negative for chest pain, palpitations, orthopnea and leg swelling.  Gastrointestinal:  Negative for abdominal pain, blood in stool, constipation, diarrhea, heartburn, melena, nausea and vomiting.  Genitourinary:  Negative for dysuria, frequency and urgency.  Musculoskeletal:  Positive for back pain and joint pain.  Skin: Negative.  Negative for itching and rash.  Neurological:  Negative for dizziness, tingling, focal weakness, weakness and headaches.  Endo/Heme/Allergies:  Does not bruise/bleed easily.  Psychiatric/Behavioral:  Negative for depression. The patient is not nervous/anxious and does not have insomnia.     MEDICAL HISTORY:  Past Medical History:  Diagnosis  Date   Anemia     when I was young and when I was pregnant    Anxiety    Arthritis    Atrial fibrillation (HCC) 03/26/2023   spontaneous episode of atrial fibrillation.   Cancer (HCC)    Basal Cell Carcinoma face   DDD (degenerative disc disease), lumbar    Depression    Environmental allergies    GERD (gastroesophageal reflux disease)    Hiatal hernia    Hyperlipidemia    Insomnia    Migraines    Mild atherosclerosis of both carotid arteries    Neuropathy    Paroxysmal atrial fibrillation (HCC) 09/10/2023   Paroxysmal A-fib-symptomatic at times, will use metoprolol  as needed, overall pretty rare Dr. Cleotilde note   Right bundle branch block    Shortness of breath dyspnea    with exertion   Spinal cord tumor    lumbar   Syncope    Urge incontinence    Wears contact lenses     SURGICAL HISTORY: Past Surgical History:  Procedure Laterality Date   ABDOMINAL HYSTERECTOMY     CATARACT EXTRACTION W/PHACO Left 03/04/2024   Procedure: PHACOEMULSIFICATION, CATARACT, WITH IOL INSERTION 14.67 01:17.4;  Surgeon: Mittie Gaskin, MD;  Location: [facility];  Service: Ophthalmology;  Laterality: Left;   CATARACT EXTRACTION W/PHACO Right 03/25/2024   Procedure: PHACOEMULSIFICATION, CATARACT, WITH IOL  11.22 00:54.1;  Surgeon: Mittie Gaskin, MD;  Location: [facility];  Service: Ophthalmology;  Laterality: Right;   CHOLECYSTECTOMY     COLONOSCOPY W/ BIOPSIES AND POLYPECTOMY     x3   COLONOSCOPY WITH PROPOFOL  N/A 09/02/2017   Procedure: COLONOSCOPY WITH PROPOFOL ;  Surgeon: Viktoria Lamar DASEN, MD;  Location: [facility] ENDOSCOPY;  Service: Endoscopy;  Laterality: N/A;   DILATION AND CURETTAGE OF UTERUS     ESOPHAGOGASTRODUODENOSCOPY  N/A 09/02/2017   Procedure: ESOPHAGOGASTRODUODENOSCOPY (EGD);  Surgeon: Viktoria Lamar DASEN, MD;  Location: [facility];  Service: Endoscopy;  Laterality: N/A;   ESOPHAGOGASTRODUODENOSCOPY N/A 12/29/2020   Procedure:  ESOPHAGOGASTRODUODENOSCOPY (EGD);  Surgeon: Onita Elspeth Sharper, DO;  Location: [facility] ENDOSCOPY;  Service: Gastroenterology;  Laterality: N/A;   LAMINECTOMY N/A 11/03/2015   Procedure: L1-2 Laminectomy for resection of intradural tumor;  Surgeon: Reyes Budge, MD;  Location: [facility] NEURO [facility]S;  Service: Neurosurgery;  Laterality: N/A;  L1-2 Laminectomy for resection of intradural tumor   LEFT HEART CATH AND CORONARY ANGIOGRAPHY Left 06/06/2017   Procedure: LEFT HEART CATH AND CORONARY ANGIOGRAPHY;  Surgeon: Ammon Blunt, MD;  Location: [facility] INVASIVE CV LAB;  Service: Cardiovascular;  Laterality: Left;   TONSILLECTOMY     TOTAL KNEE ARTHROPLASTY Right 08/13/2019   Procedure: RIGHT TOTAL KNEE ARTHROPLASTY;  Surgeon: Edie Norleen PARAS, MD;  Location: [facility] ORS;  Service: Orthopedics;  Laterality: Right;   XI ROBOTIC ASSISTED HIATAL HERNIA REPAIR N/A 04/04/2021   Procedure: XI ROBOTIC ASSISTED HIATAL HERNIA REPAIR WITH NISSEN FUNDOPLICATION;  Surgeon: Gladis Cough, MD;  Location: [facility] ORS;  Service: General;  Laterality: N/A;    SOCIAL HISTORY: Social History   Socioeconomic History   Marital status: Widowed    Spouse name: Not on file   Number of children: Not on file   Years of education: Not on file   Highest education level: Not on file  Occupational History   Not on file  Tobacco Use   Smoking status: Never   Smokeless tobacco: Never  Vaping Use   Vaping status: Never Used  Substance and Sexual Activity   Alcohol use: Yes    Comment: occasional wine  1 week   Drug use: No   Sexual activity: Not on file  Other Topics Concern   Not on file  Social History Narrative   Not on file   Social Drivers of Health   Tobacco Use: Low Risk (05/28/2024)   Patient History    Smoking Tobacco Use: Never    Smokeless Tobacco Use: Never    Passive Exposure: Not on file  Financial Resource Strain: Low Risk  (09/24/2023)   Received from [facility]   Overall Financial  Resource Strain (CARDIA)    Difficulty of Paying Living Expenses: Not very hard  Food Insecurity: No Food Insecurity (04/30/2024)   Epic    Worried About Running Out of Food in the Last Year: Never true    Ran Out of Food in the Last Year: Never true  Transportation Needs: No Transportation Needs (04/30/2024)   Epic    Lack of Transportation (Medical): No    Lack of Transportation (Non-Medical): No  Physical Activity: Not on file  Stress: Not on file  Social Connections: Not on file  Intimate Partner Violence: Not At Risk (04/30/2024)   Epic    Fear of Current or Ex-Partner: No    Emotionally Abused: No    Physically Abused: No    Sexually Abused: No  Depression (PHQ2-9): Low Risk (04/30/2024)   Depression (PHQ2-9)    PHQ-2 Score: 0  Alcohol Screen: Not on file  Housing: Low Risk (04/30/2024)   Epic    Unable to Pay for Housing in the Last Year: No    Number of Times Moved in the Last Year: 0    Homeless in the Last Year: No  Utilities: Not At Risk (04/30/2024)   Epic    Threatened with loss of utilities: No  Health Literacy: Not on file    FAMILY HISTORY: Family History  Problem Relation Age of Onset   Diabetes Mother    Cancer Mother    Hypertension Mother    Cancer Father    Breast cancer Neg Hx     ALLERGIES:  has no known allergies.   MEDICATIONS:  Current Outpatient Medications  Medication Sig Dispense Refill   aspirin  EC 81 MG tablet Take 81 mg by mouth daily. Swallow whole.     Calcium  Carbonate-Vitamin D  500-125 MG-UNIT TABS Take 1 tablet by mouth every other day.     Estriol Micronized POWD 1 application  by Does not apply route daily.     famotidine  (PEPCID ) 40 MG tablet Take 40 mg by mouth daily.     Glucosamine-Chondroitin 500-400 MG CAPS Take 1 tablet by mouth daily.      metoprolol  tartrate (LOPRESSOR ) 25 MG tablet Take 25 mg by mouth daily.     Multiple Vitamins-Minerals (QC WOMENS DAILY MULTIVITAMIN) TABS Take 1 tablet by mouth daily.       pantoprazole  (PROTONIX ) 40 MG tablet Take 1 tablet (40 mg total) by mouth daily. 30 tablet 0   Polyethyl Glycol-Propyl Glycol (SYSTANE ULTRA) 0.4-0.3 % SOLN Apply 1 drop to eye as needed.     PRESCRIPTION MEDICATION Place 1 application vaginally 2 (two) times a week. Compounded Med - Estriol 1 mg     venlafaxine XR (EFFEXOR-XR) 37.5 MG 24 hr capsule Take 37.5 mg by mouth daily with breakfast.     gabapentin  (NEURONTIN ) 300 MG capsule Take 300-600 mg by mouth. (Patient not taking: Reported on 05/28/2024)     No current facility-administered medications for this visit.    PHYSICAL EXAMINATION: Vitals:   05/28/24 1427  BP: (!) 120/55  Pulse: 60  Temp: 98.5 F (36.9 C)  SpO2: 98%   Filed Weights   05/28/24 1427  Weight: 173 lb (78.5 kg)   Physical Exam Vitals and nursing note reviewed.  HENT:     Head: Normocephalic and atraumatic.     Mouth/Throat:     Pharynx: Oropharynx is clear.  Eyes:     Extraocular Movements: Extraocular movements intact.     Pupils: Pupils are equal, round, and reactive to light.  Cardiovascular:     Rate and Rhythm: Normal rate and regular rhythm.  Pulmonary:     Comments: Decreased breath sounds bilaterally.  Abdominal:     Palpations: Abdomen is soft.  Musculoskeletal:        General: Normal range of motion.     Cervical back: Normal range of motion.  Skin:    General: Skin is warm.  Neurological:     General: No focal deficit present.     Mental Status: She is alert and oriented to person, place, and time.  Psychiatric:        Behavior: Behavior normal.        Judgment: Judgment normal.      LABORATORY DATA:  I have reviewed the data as listed Lab Results  Component Value Date   WBC 6.2 05/28/2024   HGB 10.7 (L) 05/28/2024   HCT 32.9 (L) 05/28/2024   MCV 95.1 05/28/2024   PLT 264 05/28/2024   Recent Labs    11/05/23 1349 04/30/24 1156  NA  --  136  K  --  4.2  CL  --  100  CO2  --  25  GLUCOSE  --  88  BUN  --  15  CREATININE 1.30* 1.27*  CALCIUM   --  9.6  GFRNONAA  --  42*  PROT  --  6.8  ALBUMIN  --  4.0  AST  --  16  ALT  --  11  ALKPHOS  --  89  BILITOT  --  0.3     No results found.  ASSESSMENT & PLAN:   Symptomatic anemia # Moderate anemia hemoglobin 9.7; platelets -N  [DEC 2025- PCP]; creatinine 1.2; GFR 40s calcium  normal; Ferritin-61. Tokday we reviewed previous lab results in detail which are overall reassuring. I suspect her anemia is secondary to her chronic kidney disease, stage 3b. We discussed the natural course of anemia of ckd and the role for starting EPO.  We discussed potential risks which were primarily seen in hemoglobin > 11, hypertension, VTE. Patient in agreement. Today, he hemoglobin is greater than 10 so we will hold off. Plan to check lab in 2 weeks, +/- retacrit and have her follow up with Dr Rennie in 4 weeks.   # DISPOSITION: 2 weeks- lab (H&H), +/- retacrit (new) 4 weeks- lab (cbc, ferritin, iron, cmp), Dr Rennie, +/- retacrit- la   No problem-specific Assessment & Plan notes found for this encounter.   Tinnie KANDICE Dawn, NP 05/28/2024

## 2024-05-29 ENCOUNTER — Encounter: Payer: Self-pay | Admitting: Internal Medicine

## 2024-06-11 ENCOUNTER — Inpatient Hospital Stay

## 2024-06-19 ENCOUNTER — Other Ambulatory Visit: Payer: Self-pay | Admitting: *Deleted

## 2024-06-19 DIAGNOSIS — D631 Anemia in chronic kidney disease: Secondary | ICD-10-CM

## 2024-06-23 ENCOUNTER — Inpatient Hospital Stay: Admitting: Internal Medicine

## 2024-06-23 ENCOUNTER — Inpatient Hospital Stay

## 2024-06-23 ENCOUNTER — Inpatient Hospital Stay: Attending: Internal Medicine
